# Patient Record
Sex: Female | Born: 1965 | Race: White | Hispanic: No | Marital: Married | Smoking: Never smoker
Health system: Southern US, Community
[De-identification: ages and names within clinical notes are randomized; demographics above are authoritative.]

## PROBLEM LIST (undated history)

## (undated) DIAGNOSIS — L811 Chloasma: Secondary | ICD-10-CM

## (undated) DIAGNOSIS — R0602 Shortness of breath: Secondary | ICD-10-CM

## (undated) DIAGNOSIS — F32A Depression, unspecified: Secondary | ICD-10-CM

## (undated) DIAGNOSIS — M2550 Pain in unspecified joint: Secondary | ICD-10-CM

## (undated) DIAGNOSIS — N979 Female infertility, unspecified: Secondary | ICD-10-CM

## (undated) DIAGNOSIS — I10 Essential (primary) hypertension: Secondary | ICD-10-CM

## (undated) DIAGNOSIS — Z8711 Personal history of peptic ulcer disease: Secondary | ICD-10-CM

## (undated) DIAGNOSIS — R51 Headache: Secondary | ICD-10-CM

## (undated) DIAGNOSIS — F329 Major depressive disorder, single episode, unspecified: Secondary | ICD-10-CM

## (undated) DIAGNOSIS — K5900 Constipation, unspecified: Secondary | ICD-10-CM

## (undated) DIAGNOSIS — G4730 Sleep apnea, unspecified: Secondary | ICD-10-CM

## (undated) DIAGNOSIS — D649 Anemia, unspecified: Secondary | ICD-10-CM

## (undated) DIAGNOSIS — J45909 Unspecified asthma, uncomplicated: Secondary | ICD-10-CM

## (undated) DIAGNOSIS — R011 Cardiac murmur, unspecified: Secondary | ICD-10-CM

## (undated) DIAGNOSIS — F909 Attention-deficit hyperactivity disorder, unspecified type: Secondary | ICD-10-CM

## (undated) DIAGNOSIS — E559 Vitamin D deficiency, unspecified: Secondary | ICD-10-CM

## (undated) DIAGNOSIS — R519 Headache, unspecified: Secondary | ICD-10-CM

## (undated) DIAGNOSIS — F1010 Alcohol abuse, uncomplicated: Secondary | ICD-10-CM

## (undated) DIAGNOSIS — C801 Malignant (primary) neoplasm, unspecified: Secondary | ICD-10-CM

## (undated) DIAGNOSIS — F419 Anxiety disorder, unspecified: Secondary | ICD-10-CM

## (undated) DIAGNOSIS — R002 Palpitations: Secondary | ICD-10-CM

## (undated) DIAGNOSIS — M1990 Unspecified osteoarthritis, unspecified site: Secondary | ICD-10-CM

## (undated) DIAGNOSIS — E739 Lactose intolerance, unspecified: Secondary | ICD-10-CM

## (undated) DIAGNOSIS — K279 Peptic ulcer, site unspecified, unspecified as acute or chronic, without hemorrhage or perforation: Secondary | ICD-10-CM

## (undated) DIAGNOSIS — E538 Deficiency of other specified B group vitamins: Secondary | ICD-10-CM

## (undated) DIAGNOSIS — K275 Chronic or unspecified peptic ulcer, site unspecified, with perforation: Secondary | ICD-10-CM

## (undated) DIAGNOSIS — R7303 Prediabetes: Secondary | ICD-10-CM

## (undated) DIAGNOSIS — D509 Iron deficiency anemia, unspecified: Secondary | ICD-10-CM

## (undated) DIAGNOSIS — K589 Irritable bowel syndrome without diarrhea: Secondary | ICD-10-CM

## (undated) DIAGNOSIS — K5790 Diverticulosis of intestine, part unspecified, without perforation or abscess without bleeding: Secondary | ICD-10-CM

## (undated) HISTORY — DX: Depression, unspecified: F32.A

## (undated) HISTORY — DX: Peptic ulcer, site unspecified, unspecified as acute or chronic, without hemorrhage or perforation: K27.9

## (undated) HISTORY — DX: Cardiac murmur, unspecified: R01.1

## (undated) HISTORY — DX: Chloasma: L81.1

## (undated) HISTORY — DX: Constipation, unspecified: K59.00

## (undated) HISTORY — DX: Major depressive disorder, single episode, unspecified: F32.9

## (undated) HISTORY — PX: COLONOSCOPY: SHX174

## (undated) HISTORY — DX: Attention-deficit hyperactivity disorder, unspecified type: F90.9

## (undated) HISTORY — PX: TONSILLECTOMY: SUR1361

## (undated) HISTORY — PX: GASTRIC BYPASS OPEN: SUR638

## (undated) HISTORY — DX: Irritable bowel syndrome, unspecified: K58.9

## (undated) HISTORY — PX: CHOLECYSTECTOMY: SHX55

## (undated) HISTORY — DX: Malignant (primary) neoplasm, unspecified: C80.1

## (undated) HISTORY — DX: Palpitations: R00.2

## (undated) HISTORY — DX: Deficiency of other specified B group vitamins: E53.8

## (undated) HISTORY — DX: Anxiety disorder, unspecified: F41.9

## (undated) HISTORY — DX: Shortness of breath: R06.02

## (undated) HISTORY — DX: Unspecified asthma, uncomplicated: J45.909

## (undated) HISTORY — DX: Iron deficiency anemia, unspecified: D50.9

## (undated) HISTORY — DX: Anemia, unspecified: D64.9

## (undated) HISTORY — DX: Lactose intolerance, unspecified: E73.9

## (undated) HISTORY — DX: Female infertility, unspecified: N97.9

## (undated) HISTORY — PX: OTHER SURGICAL HISTORY: SHX169

## (undated) HISTORY — PX: UPPER GI ENDOSCOPY: SHX6162

## (undated) HISTORY — DX: Personal history of peptic ulcer disease: Z87.11

## (undated) HISTORY — DX: Diverticulosis of intestine, part unspecified, without perforation or abscess without bleeding: K57.90

## (undated) HISTORY — DX: Alcohol abuse, uncomplicated: F10.10

## (undated) HISTORY — PX: ENDOMETRIAL ABLATION W/ NOVASURE: SUR434

## (undated) HISTORY — PX: ABDOMINAL HYSTERECTOMY: SHX81

## (undated) HISTORY — DX: Pain in unspecified joint: M25.50

## (undated) HISTORY — DX: Prediabetes: R73.03

## (undated) HISTORY — DX: Vitamin D deficiency, unspecified: E55.9

---

## 1999-09-30 ENCOUNTER — Emergency Department (HOSPITAL_COMMUNITY): Admission: EM | Admit: 1999-09-30 | Discharge: 1999-09-30 | Payer: Self-pay | Admitting: *Deleted

## 2002-04-08 ENCOUNTER — Emergency Department (HOSPITAL_COMMUNITY): Admission: EM | Admit: 2002-04-08 | Discharge: 2002-04-08 | Payer: Self-pay | Admitting: *Deleted

## 2002-07-09 ENCOUNTER — Emergency Department (HOSPITAL_COMMUNITY): Admission: EM | Admit: 2002-07-09 | Discharge: 2002-07-09 | Payer: Self-pay | Admitting: Emergency Medicine

## 2004-12-23 ENCOUNTER — Emergency Department (HOSPITAL_COMMUNITY): Admission: EM | Admit: 2004-12-23 | Discharge: 2004-12-23 | Payer: Self-pay | Admitting: Emergency Medicine

## 2005-09-23 ENCOUNTER — Emergency Department (HOSPITAL_COMMUNITY): Admission: EM | Admit: 2005-09-23 | Discharge: 2005-09-23 | Payer: Self-pay | Admitting: Family Medicine

## 2007-12-21 ENCOUNTER — Other Ambulatory Visit: Payer: Self-pay

## 2007-12-21 ENCOUNTER — Emergency Department: Payer: Self-pay | Admitting: Emergency Medicine

## 2008-03-07 ENCOUNTER — Encounter: Admission: RE | Admit: 2008-03-07 | Discharge: 2008-03-07 | Payer: Self-pay | Admitting: Obstetrics and Gynecology

## 2009-05-01 ENCOUNTER — Ambulatory Visit (HOSPITAL_COMMUNITY): Admission: RE | Admit: 2009-05-01 | Discharge: 2009-05-01 | Payer: Self-pay | Admitting: Radiology

## 2010-01-31 ENCOUNTER — Ambulatory Visit (HOSPITAL_COMMUNITY): Admission: RE | Admit: 2010-01-31 | Discharge: 2010-01-31 | Payer: Self-pay | Admitting: Obstetrics and Gynecology

## 2010-03-16 DIAGNOSIS — K760 Fatty (change of) liver, not elsewhere classified: Secondary | ICD-10-CM

## 2010-03-16 HISTORY — DX: Fatty (change of) liver, not elsewhere classified: K76.0

## 2010-04-06 ENCOUNTER — Encounter: Payer: Self-pay | Admitting: Obstetrics and Gynecology

## 2010-05-27 LAB — CBC
HCT: 39.8 % (ref 36.0–46.0)
Hemoglobin: 13.3 g/dL (ref 12.0–15.0)
MCH: 30.1 pg (ref 26.0–34.0)
MCHC: 33.5 g/dL (ref 30.0–36.0)
MCV: 90.1 fL (ref 78.0–100.0)
Platelets: 287 10*3/uL (ref 150–400)
RBC: 4.42 MIL/uL (ref 3.87–5.11)
RDW: 12.5 % (ref 11.5–15.5)
WBC: 7.7 10*3/uL (ref 4.0–10.5)

## 2010-05-27 LAB — BASIC METABOLIC PANEL
BUN: 6 mg/dL (ref 6–23)
CO2: 34 mEq/L — ABNORMAL HIGH (ref 19–32)
Calcium: 8.9 mg/dL (ref 8.4–10.5)
Chloride: 98 mEq/L (ref 96–112)
Creatinine, Ser: 0.58 mg/dL (ref 0.4–1.2)
GFR calc Af Amer: >60 mL/min (ref >60)
GFR calc non Af Amer: >60 mL/min (ref >60)
Glucose, Bld: 90 mg/dL (ref 70–99)
Potassium: 3.5 mEq/L (ref 3.5–5.1)
Sodium: 139 mEq/L (ref 135–145)

## 2010-05-27 LAB — PREGNANCY, URINE: Preg Test, Ur: NEGATIVE

## 2010-06-05 LAB — BASIC METABOLIC PANEL
BUN: 9 mg/dL (ref 6–23)
CO2: 27 mEq/L (ref 19–32)
Calcium: 8.8 mg/dL (ref 8.4–10.5)
Chloride: 99 mEq/L (ref 96–112)
Creatinine, Ser: 0.61 mg/dL (ref 0.4–1.2)
GFR calc Af Amer: >60 mL/min (ref >60)
GFR calc non Af Amer: >60 mL/min (ref >60)
Glucose, Bld: 89 mg/dL (ref 70–99)
Potassium: 3.2 mEq/L — ABNORMAL LOW (ref 3.5–5.1)
Sodium: 134 mEq/L — ABNORMAL LOW (ref 135–145)

## 2010-06-05 LAB — CBC
HCT: 29.9 % — ABNORMAL LOW (ref 36.0–46.0)
Hemoglobin: 9.4 g/dL — ABNORMAL LOW (ref 12.0–15.0)
MCHC: 31.5 g/dL (ref 30.0–36.0)
MCV: 69.8 fL — ABNORMAL LOW (ref 78.0–100.0)
Platelets: 376 10*3/uL (ref 150–400)
RBC: 4.28 MIL/uL (ref 3.87–5.11)
RDW: 15.8 % — ABNORMAL HIGH (ref 11.5–15.5)
WBC: 8.6 10*3/uL (ref 4.0–10.5)

## 2010-06-05 LAB — PREGNANCY, URINE: Preg Test, Ur: NEGATIVE

## 2011-07-14 ENCOUNTER — Other Ambulatory Visit: Payer: Self-pay | Admitting: Obstetrics and Gynecology

## 2012-08-03 ENCOUNTER — Ambulatory Visit: Payer: Self-pay | Admitting: Emergency Medicine

## 2012-08-03 LAB — RAPID STREP-A WITH REFLX: Micro Text Report: NEGATIVE

## 2012-08-05 LAB — BETA STREP CULTURE(ARMC)

## 2012-08-07 ENCOUNTER — Ambulatory Visit: Payer: Self-pay | Admitting: Emergency Medicine

## 2012-11-30 ENCOUNTER — Ambulatory Visit: Payer: Self-pay | Admitting: Family Medicine

## 2013-01-03 ENCOUNTER — Other Ambulatory Visit: Payer: Self-pay | Admitting: Obstetrics and Gynecology

## 2013-01-09 DIAGNOSIS — D649 Anemia, unspecified: Secondary | ICD-10-CM | POA: Insufficient documentation

## 2013-05-22 ENCOUNTER — Emergency Department: Payer: Self-pay | Admitting: Emergency Medicine

## 2013-05-22 LAB — URINALYSIS, COMPLETE
Bilirubin,UR: NEGATIVE
Blood: NEGATIVE
Glucose,UR: NEGATIVE mg/dL (ref 0–75)
Ketone: NEGATIVE
Leukocyte Esterase: NEGATIVE
Nitrite: NEGATIVE
Ph: 6 (ref 4.5–8.0)
Protein: NEGATIVE
RBC,UR: 1 /HPF (ref 0–5)
Specific Gravity: 1.019 (ref 1.003–1.030)
Squamous Epithelial: 4
WBC UR: 1 /HPF (ref 0–5)

## 2013-05-22 LAB — BASIC METABOLIC PANEL
Anion Gap: 3 — ABNORMAL LOW (ref 7–16)
BUN: 12 mg/dL (ref 7–18)
Calcium, Total: 9 mg/dL (ref 8.5–10.1)
Chloride: 102 mmol/L (ref 98–107)
Co2: 33 mmol/L — ABNORMAL HIGH (ref 21–32)
Creatinine: 0.51 mg/dL — ABNORMAL LOW (ref 0.60–1.30)
EGFR (African American): >60
EGFR (Non-African Amer.): >60
Glucose: 100 mg/dL — ABNORMAL HIGH (ref 65–99)
Osmolality: 276 (ref 275–301)
Potassium: 3.1 mmol/L — ABNORMAL LOW (ref 3.5–5.1)
Sodium: 138 mmol/L (ref 136–145)

## 2013-05-22 LAB — CBC WITH DIFFERENTIAL/PLATELET
Basophil #: 0.1 10*3/uL (ref 0.0–0.1)
Basophil %: 1.1 %
Eosinophil #: 0.3 10*3/uL (ref 0.0–0.7)
Eosinophil %: 4.1 %
HCT: 33.1 % — ABNORMAL LOW (ref 35.0–47.0)
HGB: 10.6 g/dL — ABNORMAL LOW (ref 12.0–16.0)
Lymphocyte #: 3.2 10*3/uL (ref 1.0–3.6)
Lymphocyte %: 38.8 %
MCH: 23.2 pg — ABNORMAL LOW (ref 26.0–34.0)
MCHC: 32 g/dL (ref 32.0–36.0)
MCV: 73 fL — ABNORMAL LOW (ref 80–100)
Monocyte #: 0.6 x10 3/mm (ref 0.2–0.9)
Monocyte %: 7.7 %
Neutrophil #: 4 10*3/uL (ref 1.4–6.5)
Neutrophil %: 48.3 %
Platelet: 351 10*3/uL (ref 150–440)
RBC: 4.56 10*6/uL (ref 3.80–5.20)
RDW: 19.4 % — ABNORMAL HIGH (ref 11.5–14.5)
WBC: 8.2 10*3/uL (ref 3.6–11.0)

## 2013-05-22 LAB — TROPONIN I: Troponin-I: <0.02 ng/mL

## 2013-09-12 DIAGNOSIS — L811 Chloasma: Secondary | ICD-10-CM | POA: Insufficient documentation

## 2013-09-12 DIAGNOSIS — R011 Cardiac murmur, unspecified: Secondary | ICD-10-CM | POA: Insufficient documentation

## 2013-09-26 ENCOUNTER — Ambulatory Visit: Payer: Self-pay | Admitting: Family Medicine

## 2013-09-26 LAB — COMPREHENSIVE METABOLIC PANEL
Albumin: 3.5 g/dL (ref 3.4–5.0)
Alkaline Phosphatase: 72 U/L
Anion Gap: 8 (ref 7–16)
BUN: 10 mg/dL (ref 7–18)
Bilirubin,Total: <0.1 mg/dL — ABNORMAL LOW (ref 0.2–1.0)
Calcium, Total: 8.9 mg/dL (ref 8.5–10.1)
Chloride: 102 mmol/L (ref 98–107)
Co2: 31 mmol/L (ref 21–32)
Creatinine: 0.71 mg/dL (ref 0.60–1.30)
EGFR (African American): >60
EGFR (Non-African Amer.): >60
Glucose: 85 mg/dL (ref 65–99)
Osmolality: 280 (ref 275–301)
Potassium: 3.2 mmol/L — ABNORMAL LOW (ref 3.5–5.1)
SGOT(AST): 17 U/L (ref 15–37)
SGPT (ALT): 27 U/L (ref 12–78)
Sodium: 141 mmol/L (ref 136–145)
Total Protein: 7.5 g/dL (ref 6.4–8.2)

## 2013-09-26 LAB — CBC WITH DIFFERENTIAL/PLATELET
Basophil #: 0.1 10*3/uL (ref 0.0–0.1)
Basophil %: 1 %
Eosinophil #: 0.3 10*3/uL (ref 0.0–0.7)
Eosinophil %: 3.7 %
HCT: 33.1 % — ABNORMAL LOW (ref 35.0–47.0)
HGB: 10.3 g/dL — ABNORMAL LOW (ref 12.0–16.0)
Lymphocyte #: 2.8 10*3/uL (ref 1.0–3.6)
Lymphocyte %: 40.3 %
MCH: 22.3 pg — ABNORMAL LOW (ref 26.0–34.0)
MCHC: 31.2 g/dL — ABNORMAL LOW (ref 32.0–36.0)
MCV: 72 fL — ABNORMAL LOW (ref 80–100)
Monocyte #: 0.4 x10 3/mm (ref 0.2–0.9)
Monocyte %: 6.4 %
Neutrophil #: 3.4 10*3/uL (ref 1.4–6.5)
Neutrophil %: 48.6 %
Platelet: 407 10*3/uL (ref 150–440)
RBC: 4.62 10*6/uL (ref 3.80–5.20)
RDW: 17.5 % — ABNORMAL HIGH (ref 11.5–14.5)
WBC: 6.9 10*3/uL (ref 3.6–11.0)

## 2013-09-26 LAB — LIPASE, BLOOD: Lipase: 111 U/L (ref 73–393)

## 2013-10-02 DIAGNOSIS — K5790 Diverticulosis of intestine, part unspecified, without perforation or abscess without bleeding: Secondary | ICD-10-CM | POA: Insufficient documentation

## 2013-10-31 ENCOUNTER — Ambulatory Visit: Payer: Self-pay | Admitting: Internal Medicine

## 2013-10-31 LAB — CANCER CENTER HEMOGLOBIN: HGB: 10 g/dL — ABNORMAL LOW (ref 12.0–16.0)

## 2013-11-14 ENCOUNTER — Ambulatory Visit: Payer: Self-pay | Admitting: Internal Medicine

## 2013-11-21 LAB — CANCER CENTER HEMOGLOBIN: HGB: 10.3 g/dL — ABNORMAL LOW (ref 12.0–16.0)

## 2013-12-01 DIAGNOSIS — Z8619 Personal history of other infectious and parasitic diseases: Secondary | ICD-10-CM | POA: Insufficient documentation

## 2013-12-05 LAB — PLATELET COUNT: Platelet: 338 10*3/uL (ref 150–440)

## 2013-12-05 LAB — FERRITIN: Ferritin (ARMC): 23 ng/mL (ref 8–388)

## 2013-12-05 LAB — CANCER CENTER HEMOGLOBIN: HGB: 10.9 g/dL — ABNORMAL LOW (ref 12.0–16.0)

## 2013-12-14 ENCOUNTER — Ambulatory Visit: Payer: Self-pay | Admitting: Internal Medicine

## 2014-02-23 ENCOUNTER — Other Ambulatory Visit: Payer: Self-pay | Admitting: Obstetrics and Gynecology

## 2014-02-26 LAB — CYTOLOGY - PAP

## 2014-04-20 ENCOUNTER — Other Ambulatory Visit: Payer: Self-pay | Admitting: Obstetrics and Gynecology

## 2014-05-31 ENCOUNTER — Ambulatory Visit: Payer: Self-pay | Admitting: Physician Assistant

## 2015-01-29 ENCOUNTER — Inpatient Hospital Stay: Payer: BLUE CROSS/BLUE SHIELD | Attending: Internal Medicine | Admitting: Internal Medicine

## 2015-01-29 ENCOUNTER — Encounter: Payer: Self-pay | Admitting: Internal Medicine

## 2015-01-29 ENCOUNTER — Inpatient Hospital Stay: Payer: BLUE CROSS/BLUE SHIELD

## 2015-01-29 VITALS — BP 139/75 | HR 74 | Temp 97.5°F | Resp 18 | Ht 65.0 in | Wt 308.6 lb

## 2015-01-29 DIAGNOSIS — D508 Other iron deficiency anemias: Secondary | ICD-10-CM | POA: Insufficient documentation

## 2015-01-29 DIAGNOSIS — L659 Nonscarring hair loss, unspecified: Secondary | ICD-10-CM | POA: Diagnosis not present

## 2015-01-29 DIAGNOSIS — R5383 Other fatigue: Secondary | ICD-10-CM | POA: Diagnosis not present

## 2015-01-29 DIAGNOSIS — F419 Anxiety disorder, unspecified: Secondary | ICD-10-CM | POA: Diagnosis not present

## 2015-01-29 DIAGNOSIS — Z800 Family history of malignant neoplasm of digestive organs: Secondary | ICD-10-CM | POA: Insufficient documentation

## 2015-01-29 DIAGNOSIS — Z9884 Bariatric surgery status: Secondary | ICD-10-CM | POA: Insufficient documentation

## 2015-01-29 DIAGNOSIS — K279 Peptic ulcer, site unspecified, unspecified as acute or chronic, without hemorrhage or perforation: Secondary | ICD-10-CM | POA: Insufficient documentation

## 2015-01-29 DIAGNOSIS — R011 Cardiac murmur, unspecified: Secondary | ICD-10-CM | POA: Insufficient documentation

## 2015-01-29 DIAGNOSIS — K5790 Diverticulosis of intestine, part unspecified, without perforation or abscess without bleeding: Secondary | ICD-10-CM | POA: Insufficient documentation

## 2015-01-29 DIAGNOSIS — Z8711 Personal history of peptic ulcer disease: Secondary | ICD-10-CM | POA: Diagnosis not present

## 2015-01-29 DIAGNOSIS — F329 Major depressive disorder, single episode, unspecified: Secondary | ICD-10-CM | POA: Insufficient documentation

## 2015-01-29 DIAGNOSIS — L811 Chloasma: Secondary | ICD-10-CM | POA: Insufficient documentation

## 2015-01-29 DIAGNOSIS — D509 Iron deficiency anemia, unspecified: Secondary | ICD-10-CM

## 2015-01-29 DIAGNOSIS — Z79899 Other long term (current) drug therapy: Secondary | ICD-10-CM | POA: Insufficient documentation

## 2015-01-29 DIAGNOSIS — J45909 Unspecified asthma, uncomplicated: Secondary | ICD-10-CM | POA: Insufficient documentation

## 2015-01-29 DIAGNOSIS — J029 Acute pharyngitis, unspecified: Secondary | ICD-10-CM | POA: Diagnosis not present

## 2015-01-29 LAB — CBC WITH DIFFERENTIAL/PLATELET
Basophils Absolute: 0.1 10*3/uL (ref 0–0.1)
Basophils Relative: 1 %
Eosinophils Absolute: 0.2 10*3/uL (ref 0–0.7)
Eosinophils Relative: 2 %
HCT: 34.2 % — ABNORMAL LOW (ref 35.0–47.0)
Hemoglobin: 10.9 g/dL — ABNORMAL LOW (ref 12.0–16.0)
Lymphocytes Relative: 29 %
Lymphs Abs: 2.8 10*3/uL (ref 1.0–3.6)
MCH: 23.6 pg — ABNORMAL LOW (ref 26.0–34.0)
MCHC: 31.9 g/dL — ABNORMAL LOW (ref 32.0–36.0)
MCV: 74 fL — ABNORMAL LOW (ref 80.0–100.0)
Monocytes Absolute: 0.6 10*3/uL (ref 0.2–0.9)
Monocytes Relative: 6 %
Neutro Abs: 5.9 10*3/uL (ref 1.4–6.5)
Neutrophils Relative %: 62 %
Platelets: 348 10*3/uL (ref 150–440)
RBC: 4.62 MIL/uL (ref 3.80–5.20)
RDW: 17.4 % — ABNORMAL HIGH (ref 11.5–14.5)
WBC: 9.5 10*3/uL (ref 3.6–11.0)

## 2015-01-29 LAB — FERRITIN: Ferritin: 6 ng/mL — ABNORMAL LOW (ref 11–307)

## 2015-01-29 NOTE — Progress Notes (Signed)
[facility] OFFICE PROGRESS NOTE  Patient Care Team: Hortencia Pilar, MD as PCP - General (Family Medicine)   SUMMARY OF ONCOLOGIC HISTORY:  #  IRON DEF ANEMIA- likely secondary to gastric bypass/malabsorption [2011]   INTERVAL HISTORY:  A pleasant 49 year old female patient with above history of iron deficiency anemia secondary to history of gastric bypass/malabsorption is here for follow-up. Patient last received IV iron in 2015 September.  She denies any blood in stools black stools. However does admit to increasing fatigue; hair loss; also brittle nails.   Just the last few days she noted to have sore throat and cold like symptoms. Otherwise denies any unusual weight loss or difficulty swallowing.  REVIEW OF SYSTEMS:  A complete 10 point review of system is done which is negative except mentioned above/history of present illness.   PAST MEDICAL HISTORY :  Past Medical History  Diagnosis Date  . IDA (iron deficiency anemia)   . PUD (peptic ulcer disease)   . Asthma   . Anxiety and depression   . Anemia   . Melasma   . Heart murmur   . Diverticulosis     PAST SURGICAL HISTORY :   Past Surgical History  Procedure Laterality Date  . Gastric bypass open    . Tonsillectomy    . Endometrial ablation w/ novasure      FAMILY HISTORY :   Family History  Problem Relation Age of Onset  . Pancreatic cancer Sister 25  . Breast cancer Maternal Aunt 60  . COPD Mother   . Anxiety disorder Mother   . Depression Mother   . Diabetes type II Mother   . Obesity Mother   . Crohn's disease Mother   . Coronary artery disease Father   . Diabetes type II Father   . Hypertension Father   . Obesity Sister   . Depression Maternal Grandmother   . Obesity Maternal Grandmother   . Cancer Maternal Grandmother   . Coronary artery disease Maternal Grandmother   . Coronary artery disease Paternal Grandfather   . Glaucoma Paternal Grandmother   . Hypertension Paternal  Grandmother   . Osteoarthritis Paternal Grandmother     SOCIAL HISTORY:   Social History  Substance Use Topics  . Smoking status: Not on file  . Smokeless tobacco: Not on file  . Alcohol Use: Not on file    ALLERGIES:  is allergic to nsaids.  MEDICATIONS:  Current Outpatient Prescriptions  Medication Sig Dispense Refill  . albuterol (PROVENTIL HFA) 108 (90 BASE) MCG/ACT inhaler Inhale 1 puff into the lungs every 4 (four) hours while awake. Prn shortness of breath    . FLUoxetine (PROZAC) 40 MG capsule Take 1 capsule by mouth daily.    [name] HYDROcodone-acetaminophen (NORCO/VICODIN) 5-325 MG tablet Take 1 tablet by mouth every 4 (four) hours. For knee pain    . lamoTRIgine (LAMICTAL) 200 MG tablet Take 1 tablet by mouth 2 (two) times daily.    [name] ALPRAZolam (XANAX) 1 MG tablet Take 1 tablet by mouth daily. As needed for anxiety  0  . ARIPiprazole (ABILIFY) 2 MG tablet Take 2 mg by mouth daily.  0  . Cetirizine HCl 10 MG CAPS Take 1 capsule by mouth daily as needed. allergies    . FLUoxetine (PROZAC) 20 MG tablet Take 1 tablet by mouth daily. Will start as soon as finished with 40 mg capsule of prozac  0   No current facility-administered medications for this visit.    PHYSICAL  EXAMINATION:  BP 139/75 mmHg  Pulse 74  Temp(Src) 97.5 F (36.4 C) (Tympanic)  Resp 18  Ht 5' 5" (1.651 m)  Wt 308 lb 10.3 oz (140 kg)  BMI 51.36 kg/m2  Filed Weights   01/29/15 1450  Weight: 308 lb 10.3 oz (140 kg)    GENERAL: Well-nourished well-developed; Alert, no distress and comfortable. Alone. Obese. EYES: Positive for pallor or icterus. OROPHARYNX: no thrush or ulceration; good dentition  NECK: supple, no masses felt LYMPH:  no palpable lymphadenopathy in the cervical, axillary or inguinal regions LUNGS: clear to auscultation and  No wheeze or crackles HEART/CVS: regular rate & rhythm and no murmurs; No lower extremity edema ABDOMEN:abdomen soft, non-tender and normal bowel  sounds Musculoskeletal:no cyanosis of digits and no clubbing  PSYCH: alert & oriented x 3 with fluent speech NEURO: no focal motor/sensory deficits SKIN:  no rashes or significant lesions  LABORATORY DATA:  I have reviewed the data as listed    Component Value Date/Time   NA 141 09/26/2013 1414   NA 139 01/24/2010 1230   K 3.2* 09/26/2013 1414   K 3.5 01/24/2010 1230   CL 102 09/26/2013 1414   CL 98 01/24/2010 1230   CO2 31 09/26/2013 1414   CO2 34* 01/24/2010 1230   GLUCOSE 85 09/26/2013 1414   GLUCOSE 90 01/24/2010 1230   BUN 10 09/26/2013 1414   BUN 6 01/24/2010 1230   CREATININE 0.71 09/26/2013 1414   CREATININE 0.58 01/24/2010 1230   CALCIUM 8.9 09/26/2013 1414   CALCIUM 8.9 01/24/2010 1230   PROT 7.5 09/26/2013 1414   ALBUMIN 3.5 09/26/2013 1414   AST 17 09/26/2013 1414   ALT 27 09/26/2013 1414   ALKPHOS 72 09/26/2013 1414   BILITOT < 0.1* 09/26/2013 1414   GFRNONAA >60 09/26/2013 1414   GFRNONAA >60 01/24/2010 1230   GFRAA >60 09/26/2013 1414   GFRAA  01/24/2010 1230    >60        The eGFR has been calculated using the MDRD equation. This calculation has not been validated in all clinical situations. eGFR's persistently <60 mL/min signify possible Chronic Kidney Disease.    No results found for: SPEP, UPEP  Lab Results  Component Value Date   WBC 6.9 09/26/2013   NEUTROABS 3.4 09/26/2013   HGB 10.9* 12/05/2013   HCT 33.1* 09/26/2013   MCV 72* 09/26/2013   PLT 338 12/05/2013      Chemistry      Component Value Date/Time   NA 141 09/26/2013 1414   NA 139 01/24/2010 1230   K 3.2* 09/26/2013 1414   K 3.5 01/24/2010 1230   CL 102 09/26/2013 1414   CL 98 01/24/2010 1230   CO2 31 09/26/2013 1414   CO2 34* 01/24/2010 1230   BUN 10 09/26/2013 1414   BUN 6 01/24/2010 1230   CREATININE 0.71 09/26/2013 1414   CREATININE 0.58 01/24/2010 1230      Component Value Date/Time   CALCIUM 8.9 09/26/2013 1414   CALCIUM 8.9 01/24/2010 1230   ALKPHOS 72  09/26/2013 1414   AST 17 09/26/2013 1414   ALT 27 09/26/2013 1414   BILITOT < 0.1* 09/26/2013 1414       RADIOGRAPHIC STUDIES: I have personally reviewed the radiological images as listed and agreed with the findings in the report. No results found.   ASSESSMENT & PLAN:   # Iron deficiency anemia- likely secondary to malabsorption/prior history of gastric bypass. Symptomatically patient appears to be iron deficient. Her  most recent hemoglobin from March 2016-11.2 with MCV of 75.   # Given her significant symptoms of fatigue/suggestive of iron deficiency- I would recommend checking CBC with ferritin today. And if low, I would recommend IV iron. Orders for Feraheme 2; if possible this week. Also Venofer 200 mg IV in approximately 4 months again.   # I would recommend a follow-up in approximately 4 months or sooner based upon her above blood work. She agrees.  Orders Placed This Encounter  Procedures  . CBC with Differential    Standing Status: Future     Number of Occurrences:      Standing Expiration Date: 01/29/2016  . Ferritin    Standing Status: Future     Number of Occurrences:      Standing Expiration Date: 01/29/2016   All questions were answered. The patient knows to call the clinic with any problems, questions or concerns. No barriers to learning was detected.      Cammie Sickle, MD 01/29/2015 3:12 PM

## 2015-01-30 ENCOUNTER — Telehealth: Payer: Self-pay | Admitting: Internal Medicine

## 2015-01-30 NOTE — Telephone Encounter (Signed)
Please inform patient that her labs show low iron. She would benefit from IV iron therapy. Again we'll plan to do IV iron in 4 months if labs are abnormal at that time.

## 2015-01-30 NOTE — Telephone Encounter (Signed)
Patient informed. Questions were answered to her satisfaction.

## 2015-02-04 ENCOUNTER — Inpatient Hospital Stay: Payer: BLUE CROSS/BLUE SHIELD

## 2015-02-04 VITALS — BP 149/63 | HR 63 | Temp 97.8°F | Resp 20

## 2015-02-04 DIAGNOSIS — D508 Other iron deficiency anemias: Secondary | ICD-10-CM

## 2015-02-04 DIAGNOSIS — D509 Iron deficiency anemia, unspecified: Secondary | ICD-10-CM | POA: Diagnosis not present

## 2015-02-04 MED ORDER — SODIUM CHLORIDE 0.9 % IV SOLN
Freq: Once | INTRAVENOUS | Status: AC
  Administered 2015-02-04: 09:00:00 via INTRAVENOUS
  Filled 2015-02-04: qty 1000

## 2015-02-04 MED ORDER — SODIUM CHLORIDE 0.9 % IV SOLN
510.0000 mg | Freq: Once | INTRAVENOUS | Status: AC
  Administered 2015-02-04: 510 mg via INTRAVENOUS
  Filled 2015-02-04: qty 17

## 2015-02-05 ENCOUNTER — Ambulatory Visit: Payer: BLUE CROSS/BLUE SHIELD

## 2015-04-02 ENCOUNTER — Other Ambulatory Visit: Payer: Self-pay | Admitting: Obstetrics and Gynecology

## 2015-04-03 LAB — CYTOLOGY - PAP

## 2015-05-14 ENCOUNTER — Other Ambulatory Visit: Payer: Self-pay | Admitting: Obstetrics and Gynecology

## 2015-05-27 ENCOUNTER — Other Ambulatory Visit: Payer: BLUE CROSS/BLUE SHIELD

## 2015-05-28 ENCOUNTER — Ambulatory Visit: Payer: BLUE CROSS/BLUE SHIELD

## 2015-05-28 ENCOUNTER — Ambulatory Visit: Payer: BLUE CROSS/BLUE SHIELD | Admitting: Internal Medicine

## 2015-06-11 DIAGNOSIS — E538 Deficiency of other specified B group vitamins: Secondary | ICD-10-CM | POA: Insufficient documentation

## 2015-07-03 ENCOUNTER — Other Ambulatory Visit: Payer: Self-pay | Admitting: Family Medicine

## 2015-07-03 DIAGNOSIS — R1011 Right upper quadrant pain: Secondary | ICD-10-CM

## 2015-07-04 ENCOUNTER — Ambulatory Visit: Payer: BLUE CROSS/BLUE SHIELD

## 2016-01-11 ENCOUNTER — Emergency Department
Admission: EM | Admit: 2016-01-11 | Discharge: 2016-01-11 | Disposition: A | Discharge status: Home / Self Care | Payer: BLUE CROSS/BLUE SHIELD | Attending: Emergency Medicine | Admitting: Emergency Medicine

## 2016-01-11 ENCOUNTER — Encounter: Payer: Self-pay | Admitting: Emergency Medicine

## 2016-01-11 DIAGNOSIS — Z79899 Other long term (current) drug therapy: Secondary | ICD-10-CM | POA: Insufficient documentation

## 2016-01-11 DIAGNOSIS — S51812A Laceration without foreign body of left forearm, initial encounter: Secondary | ICD-10-CM | POA: Diagnosis not present

## 2016-01-11 DIAGNOSIS — Y939 Activity, unspecified: Secondary | ICD-10-CM | POA: Insufficient documentation

## 2016-01-11 DIAGNOSIS — J45909 Unspecified asthma, uncomplicated: Secondary | ICD-10-CM | POA: Diagnosis not present

## 2016-01-11 DIAGNOSIS — Y999 Unspecified external cause status: Secondary | ICD-10-CM | POA: Diagnosis not present

## 2016-01-11 DIAGNOSIS — W260XXA Contact with knife, initial encounter: Secondary | ICD-10-CM | POA: Insufficient documentation

## 2016-01-11 DIAGNOSIS — Z23 Encounter for immunization: Secondary | ICD-10-CM | POA: Insufficient documentation

## 2016-01-11 DIAGNOSIS — Y92000 Kitchen of unspecified non-institutional (private) residence as  the place of occurrence of the external cause: Secondary | ICD-10-CM | POA: Diagnosis not present

## 2016-01-11 MED ORDER — OXYCODONE-ACETAMINOPHEN 5-325 MG PO TABS
1.0000 | ORAL_TABLET | Freq: Once | ORAL | Status: AC
Start: 2016-01-11 — End: 2016-01-11
  Administered 2016-01-11: 1 via ORAL
  Filled 2016-01-11: qty 1

## 2016-01-11 MED ORDER — ONDANSETRON 4 MG PO TBDP
4.0000 mg | ORAL_TABLET | Freq: Once | ORAL | Status: AC
  Administered 2016-01-11: 4 mg via ORAL
  Filled 2016-01-11: qty 1

## 2016-01-11 MED ORDER — OXYCODONE-ACETAMINOPHEN 5-325 MG PO TABS: 1.0000 | ORAL_TABLET | ORAL | 0 refills | Status: DC | PRN

## 2016-01-11 MED ORDER — LIDOCAINE HCL (PF) 1 % IJ SOLN
30.0000 mL | Freq: Once | INTRAMUSCULAR | Status: DC
  Filled 2016-01-11: qty 30

## 2016-01-11 MED ORDER — TETANUS-DIPHTH-ACELL PERTUSSIS 5-2.5-18.5 LF-MCG/0.5 IM SUSP
0.5000 mL | Freq: Once | INTRAMUSCULAR | Status: AC
  Administered 2016-01-11: 0.5 mL via INTRAMUSCULAR
  Filled 2016-01-11: qty 0.5

## 2016-01-11 NOTE — ED Notes (Signed)
Pt states understanding of discharge instructions. NAD noted at this time.

## 2016-01-11 NOTE — Discharge Instructions (Signed)
WOUND CARE Please return in 12 days to have your stitches/staples removed or sooner if you have concerns.  Keep area clean and dry for 24 hours. Do not remove bandage, if applied.  After 24 hours, remove bandage and wash wound gently with mild soap and warm water. Reapply a new bandage after cleaning wound, if directed.  Continue daily cleansing with soap and water until stitches/staples are removed.  Do not apply any ointments or creams to the wound while stitches/staples are in place, as this may cause delayed healing.  Notify the office if you experience any of the following signs of infection: Swelling, redness, pus drainage, streaking, fever >101.0 F  Notify the office if you experience excessive bleeding that does not stop after 15-20 minutes of constant, firm pressure.  Percocet as needed for pain.

## 2016-01-11 NOTE — ED Triage Notes (Signed)
Pt to ed with c/o laceration to left forearm.  Laceration approx 4 inches long, bleeding controlled.  Pt states she tripped and sliced her arm with a knife.

## 2016-01-11 NOTE — ED Provider Notes (Signed)
[facility] Emergency Department Provider Note   ____________________________________________   First MD Initiated Contact with Patient 01/11/16 1613     (approximate)  I have reviewed the triage vital signs and the nursing notes.   HISTORY  Chief Complaint Laceration   HPI Tracy [name] is a 50 y.o. female history with complaint of laceration to her left forearm. Patient states that she was standing in the kitchen with a knife in her hand when she tripped over a child's toy causing her to cut her arm. Patient is also uncertain of her last tetanus immunization but most likely has been 15 years ago.Currently patient denies any pain but states that she is nauseous from pain that she encountered while she was at home.   Past Medical History:  Diagnosis Date  . Anemia   . Anxiety and depression   . Asthma   . Diverticulosis   . Heart murmur   . IDA (iron deficiency anemia)   . Melasma   . PUD (peptic ulcer disease)     Patient Active Problem List   Diagnosis Date Noted  . Anxiety and depression 01/29/2015  . Airway hyperreactivity 01/29/2015  . Gastroduodenal ulcer 01/29/2015  . Other iron deficiency anemia 01/29/2015  . H/O infectious disease 12/01/2013  . DD (diverticular disease) 10/02/2013  . Cardiac murmur 09/12/2013  . Chloasma 09/12/2013  . Absolute anemia 01/09/2013    Past Surgical History:  Procedure Laterality Date  . ENDOMETRIAL ABLATION W/ NOVASURE    . GASTRIC BYPASS OPEN    . TONSILLECTOMY      Prior to Admission medications   Medication Sig Start Date End Date Taking? Authorizing Provider  albuterol (PROVENTIL HFA) 108 (90 BASE) MCG/ACT inhaler Inhale 1 puff into the lungs every 4 (four) hours while awake. Prn shortness of breath 02/16/14 02/17/15  Historical Provider, MD  ALPRAZolam Duanne Moron) 1 MG tablet Take 1 tablet by mouth daily. As needed for anxiety 01/24/15   Historical Provider, MD  ARIPiprazole (ABILIFY) 2 MG  tablet Take 2 mg by mouth daily. 11/23/14   Historical Provider, MD  Cetirizine HCl 10 MG CAPS Take 1 capsule by mouth daily as needed. allergies    Historical Provider, MD  FLUoxetine (PROZAC) 20 MG tablet Take 1 tablet by mouth daily. Will start as soon as finished with 40 mg capsule of prozac 01/28/15   Historical Provider, MD  FLUoxetine (PROZAC) 40 MG capsule Take 1 capsule by mouth daily. 12/14/14   Historical Provider, MD  HYDROcodone-acetaminophen (NORCO/VICODIN) 5-325 MG tablet Take 1 tablet by mouth every 4 (four) hours. For knee pain 01/04/15   Historical Provider, MD  lamoTRIgine (LAMICTAL) 200 MG tablet Take 1 tablet by mouth 2 (two) times daily. 12/14/14   Historical Provider, MD  oxyCODONE-acetaminophen (PERCOCET) 5-325 MG tablet Take 1 tablet by mouth every 4 (four) hours as needed for severe pain. 01/11/16   Johnn Hai, PA-C    Allergies Nsaids  Family History  Problem Relation Age of Onset  . Pancreatic cancer Sister 79    died  . Breast cancer Maternal Aunt 60  . COPD Mother   . Anxiety disorder Mother   . Depression Mother   . Diabetes type II Mother   . Obesity Mother   . Crohn's disease Mother   . Coronary artery disease Father   . Diabetes type II Father   . Hypertension Father   . Obesity Sister   . Depression Maternal Grandmother   . Obesity  Maternal Grandmother   . Cancer Maternal Grandmother   . Coronary artery disease Maternal Grandmother   . Coronary artery disease Paternal Grandfather   . Leukemia Paternal Grandfather   . Glaucoma Paternal Grandmother   . Hypertension Paternal Grandmother   . Osteoarthritis Paternal Grandmother   . Skin cancer Paternal Grandmother     Social History Social History  Substance Use Topics  . Smoking status: Never Smoker  . Smokeless tobacco: Never Used  . Alcohol use No    Review of Systems Constitutional: No fever/chills Cardiovascular: Denies chest pain. Respiratory: Denies shortness of  breath. Gastrointestinal: No abdominal pain. Positive nausea, no vomiting.   Musculoskeletal: Left arm pain. Skin: Positive laceration left arm. Neurological: Negative for headaches, focal weakness or numbness.  10-point ROS otherwise negative.  ____________________________________________   PHYSICAL EXAM:  VITAL SIGNS: ED Triage Vitals  Enc Vitals Group     BP 01/11/16 1500 (!) 154/63     Pulse Rate 01/11/16 1500 85     Resp 01/11/16 1500 18     Temp 01/11/16 1500 98.5 F (36.9 C)     Temp Source 01/11/16 1500 Oral     SpO2 01/11/16 1500 96 %     Weight 01/11/16 1417 275 lb (124.7 kg)     Height 01/11/16 1417 5\' 6"  (1.676 m)     Head Circumference --      Peak Flow --      Pain Score 01/11/16 1417 0     Pain Loc --      Pain Edu? --      Excl. in [place]? --     Constitutional: Alert and oriented. Well appearing and in no acute distress. Eyes: Conjunctivae are normal. PERRL. EOMI. Head: Atraumatic. Nose: No congestion/rhinnorhea. Neck: No stridor.   Cardiovascular: Normal rate, regular rhythm. Grossly normal heart sounds.  Good peripheral circulation. Respiratory: Normal respiratory effort.  No retractions. Lungs CTAB. Musculoskeletal: Moves upper and lower extremities without any difficulty. Motor sensory function intact digits distal to her injury. Patient is able to flex and extend her wrist and fingers without any difficulty. Neurologic:  Normal speech and language. No gross focal neurologic deficits are appreciated. No gait instability. Skin:  Skin is warm, dry.  Laceration  diagonally left mid forearm across  7 cm. No foreign body was noted. There is no active bleeding at present. Moderate amount of subcutaneous tissue is exposed. Psychiatric: Mood and affect are normal. Speech and behavior are normal.  ____________________________________________   LABS (all labs ordered are listed, but only abnormal results are displayed)  Labs Reviewed - No data to  display  PROCEDURES  Procedure(s) performed: LACERATION REPAIR Performed by: Johnn Hai Authorized by: Johnn Hai Consent: Verbal consent obtained. Risks and benefits: risks, benefits and alternatives were discussed Consent given by: patient Patient identity confirmed: provided demographic data Prepped and Draped in normal sterile fashion Wound explored  Laceration Location: Left forearm volar surface  Laceration Length: 7 cm  No Foreign Bodies seen or palpated  Anesthesia: local infiltration  Local anesthetic: lidocaine 1 % without epinephrine  Anesthetic total: 10 ml  Irrigation method: syringe Amount of cleaning: standard  Skin closure: 40 and 5-0 Ethilon   Number of sutures: 16   Technique: Mattress and single interrupted   Patient tolerance: Patient tolerated the procedure well with no immediate complications.  Procedures  Critical Care performed: No  ____________________________________________   INITIAL IMPRESSION / ASSESSMENT AND PLAN / ED COURSE  Pertinent labs &  imaging results that were available during my care of the patient were reviewed by me and considered in my medical decision making (see chart for details).    Clinical Course  Patient tolerated procedure well. Patient was given a prescription for Percocet as needed for severe pain. Patient was instructed to keep clean and dry and watch for signs of infection. She will follow-up with her primary for suture removal in 12 days.   ____________________________________________   FINAL CLINICAL IMPRESSION(S) / ED DIAGNOSES  Final diagnoses:  Laceration of left forearm, initial encounter      NEW MEDICATIONS STARTED DURING THIS VISIT:  New Prescriptions   OXYCODONE-ACETAMINOPHEN (PERCOCET) 5-325 MG TABLET    Take 1 tablet by mouth every 4 (four) hours as needed for severe pain.     Note:  This document was prepared using Dragon voice recognition software and may include  unintentional dictation errors.    Johnn Hai, PA-C 01/11/16 1836    Carrie Mew, MD 01/11/16 2032

## 2016-02-13 ENCOUNTER — Encounter: Payer: Self-pay | Admitting: Emergency Medicine

## 2016-02-13 ENCOUNTER — Emergency Department: Payer: BLUE CROSS/BLUE SHIELD

## 2016-02-13 ENCOUNTER — Emergency Department
Admission: EM | Admit: 2016-02-13 | Discharge: 2016-02-13 | Disposition: A | Discharge status: Home / Self Care | Payer: BLUE CROSS/BLUE SHIELD | Attending: Emergency Medicine | Admitting: Emergency Medicine

## 2016-02-13 DIAGNOSIS — Y999 Unspecified external cause status: Secondary | ICD-10-CM | POA: Insufficient documentation

## 2016-02-13 DIAGNOSIS — S99911A Unspecified injury of right ankle, initial encounter: Secondary | ICD-10-CM | POA: Diagnosis present

## 2016-02-13 DIAGNOSIS — Y929 Unspecified place or not applicable: Secondary | ICD-10-CM | POA: Diagnosis not present

## 2016-02-13 DIAGNOSIS — S93401A Sprain of unspecified ligament of right ankle, initial encounter: Secondary | ICD-10-CM | POA: Diagnosis not present

## 2016-02-13 DIAGNOSIS — W108XXA Fall (on) (from) other stairs and steps, initial encounter: Secondary | ICD-10-CM | POA: Insufficient documentation

## 2016-02-13 DIAGNOSIS — Z79899 Other long term (current) drug therapy: Secondary | ICD-10-CM | POA: Diagnosis not present

## 2016-02-13 DIAGNOSIS — Y9389 Activity, other specified: Secondary | ICD-10-CM | POA: Diagnosis not present

## 2016-02-13 DIAGNOSIS — J45909 Unspecified asthma, uncomplicated: Secondary | ICD-10-CM | POA: Diagnosis not present

## 2016-02-13 MED ORDER — ONDANSETRON HCL 4 MG/2ML IJ SOLN
4.0000 mg | INTRAMUSCULAR | Status: AC
  Administered 2016-02-13: 4 mg via INTRAVENOUS
  Filled 2016-02-13: qty 2

## 2016-02-13 MED ORDER — MORPHINE SULFATE (PF) 4 MG/ML IV SOLN
4.0000 mg | Freq: Once | INTRAVENOUS | Status: AC
  Administered 2016-02-13: 4 mg via INTRAVENOUS
  Filled 2016-02-13: qty 1

## 2016-02-13 MED ORDER — HYDROCODONE-ACETAMINOPHEN 5-325 MG PO TABS: 1.0000 | ORAL_TABLET | ORAL | 0 refills | Status: DC | PRN

## 2016-02-13 MED ORDER — DOCUSATE SODIUM 100 MG PO CAPS: ORAL_CAPSULE | ORAL | 0 refills | Status: DC

## 2016-02-13 NOTE — ED Triage Notes (Signed)
Patient arrived from home via [facility]. Patient was in attic stepped on top step with a box and lost footing. [place] [address] backward out of attic landed on right ankle then buttocks. Denies hitting head or LOC. Patient complains of right ankle pain with bruising and swelling to ankle and top of foot. Pedal pulse in tact, cap refill less than 3 sec.

## 2016-02-13 NOTE — ED Notes (Signed)
ED Provider at bedside. 

## 2016-02-13 NOTE — Discharge Instructions (Signed)
As we discussed, you do not have any broken or dislocated bones in your foot or ankle, but you do have an ankle sprain.  Please read through the included information about routine injury care (RICE = rest, ice, compression, elevation), and take over-the-counter pain medicine according to label instructions.  If you do not have any reason to avoid ibuprofen, you can also consider taking ibuprofen 600 mg 3 times a day with meals, but do this for no more than 5 days as it may cause to some stomach discomfort over time.  Use crutches if provided and you may bear weight as tolerated.  Follow-up is recommended with the orthopedic surgeon or with your regular doctor.  Take Norco as prescribed for severe pain. Do not drink alcohol, drive or participate in any other potentially dangerous activities while taking this medication as it may make you sleepy. Do not take this medication with any other sedating medications, either prescription or over-the-counter. If you were prescribed Percocet or Vicodin, do not take these with acetaminophen (Tylenol) as it is already contained within these medications.   This medication is an opiate (or narcotic) pain medication and can be habit forming.  Use it as little as possible to achieve adequate pain control.  Do not use or use it with extreme caution if you have a history of opiate abuse or dependence.  If you are on a pain contract with your primary care doctor or a pain specialist, be sure to let them know you were prescribed this medication today from the [facility] Emergency Department.  This medication is intended for your use only - do not give any to anyone else and keep it in a secure place where nobody else, especially children, have access to it.  It will also cause or worsen constipation, so you may want to consider taking an over-the-counter stool softener while you are taking this medication.

## 2016-02-13 NOTE — ED Provider Notes (Signed)
[facility] Emergency Department Provider Note  ____________________________________________   First MD Initiated Contact with Patient 02/13/16 1524     (approximate)  I have reviewed the triage vital signs and the nursing notes.   HISTORY  Chief Complaint Foot Injury    HPI [name] is a 50 y.o. female With history of depression, anxiety, obesity, but no other significant chronic medical conditions who presents by EMS for evaluation of acute onset right foot and ankle pain after falling down some steps in her attic.  She was near the top of her flight of pulled down stairs that leads into the attic when her foot slipped and she fell.  she feels that she rolled her right ankle and there was immediate acute sharp pain as well as swelling and bruising.  The swelling has increased since the injury which occurred just prior to arrival.  Movement makes the pain worse and she cannot bear weight.  She contused the outer part of her leg including both the lower leg and part of her thigh, but the only significant pain at this point is in her right foot and ankle.  She did not strike her head, did not lose consciousness, and currently denies headache and neck pain. she has had no chest pain, shortness of breath, abdominal pain, nausea, vomiting.  Past Medical History:  Diagnosis Date  . Anemia   . Anxiety and depression   . Asthma   . Diverticulosis   . Heart murmur   . IDA (iron deficiency anemia)   . Melasma   . PUD (peptic ulcer disease)     Patient Active Problem List   Diagnosis Date Noted  . Anxiety and depression 01/29/2015  . Airway hyperreactivity 01/29/2015  . Gastroduodenal ulcer 01/29/2015  . Other iron deficiency anemia 01/29/2015  . H/O infectious disease 12/01/2013  . DD (diverticular disease) 10/02/2013  . Cardiac murmur 09/12/2013  . Chloasma 09/12/2013  . Absolute anemia 01/09/2013    Past Surgical History:  Procedure Laterality  Date  . ENDOMETRIAL ABLATION W/ NOVASURE    . GASTRIC BYPASS OPEN    . TONSILLECTOMY      Prior to Admission medications   Medication Sig Start Date End Date Taking? Authorizing Provider  albuterol (PROVENTIL HFA) 108 (90 BASE) MCG/ACT inhaler Inhale 1 puff into the lungs every 4 (four) hours while awake. Prn shortness of breath 02/16/14 02/17/15  Historical Provider, MD  ALPRAZolam Duanne Moron) 1 MG tablet Take 1 tablet by mouth daily. As needed for anxiety 01/24/15   Historical Provider, MD  ARIPiprazole (ABILIFY) 2 MG tablet Take 2 mg by mouth daily. 11/23/14   Historical Provider, MD  Cetirizine HCl 10 MG CAPS Take 1 capsule by mouth daily as needed. allergies    Historical Provider, MD  docusate sodium (COLACE) 100 MG capsule Take 1 tablet once or twice daily as needed for constipation while taking narcotic pain medicine 02/13/16   Hinda Kehr, MD  FLUoxetine (PROZAC) 20 MG tablet Take 1 tablet by mouth daily. Will start as soon as finished with 40 mg capsule of prozac 01/28/15   Historical Provider, MD  FLUoxetine (PROZAC) 40 MG capsule Take 1 capsule by mouth daily. 12/14/14   Historical Provider, MD  HYDROcodone-acetaminophen (NORCO/VICODIN) 5-325 MG tablet Take 1-2 tablets by mouth every 4 (four) hours as needed for moderate pain. 02/13/16   Hinda Kehr, MD  lamoTRIgine (LAMICTAL) 200 MG tablet Take 1 tablet by mouth 2 (two) times daily. 12/14/14  Historical Provider, MD    Allergies Nsaids  Family History  Problem Relation Age of Onset  . Pancreatic cancer Sister 74    died  . Breast cancer Maternal Aunt 60  . COPD Mother   . Anxiety disorder Mother   . Depression Mother   . Diabetes type II Mother   . Obesity Mother   . Crohn's disease Mother   . Coronary artery disease Father   . Diabetes type II Father   . Hypertension Father   . Obesity Sister   . Depression Maternal Grandmother   . Obesity Maternal Grandmother   . Cancer Maternal Grandmother   . Coronary artery disease  Maternal Grandmother   . Coronary artery disease Paternal Grandfather   . Leukemia Paternal Grandfather   . Glaucoma Paternal Grandmother   . Hypertension Paternal Grandmother   . Osteoarthritis Paternal Grandmother   . Skin cancer Paternal Grandmother     Social History Social History  Substance Use Topics  . Smoking status: Never Smoker  . Smokeless tobacco: Never Used  . Alcohol use No    Review of Systems Constitutional: No fever/chills Eyes: No visual changes. ENT: No sore throat. Cardiovascular: Denies chest pain. Respiratory: Denies shortness of breath. Gastrointestinal: No abdominal pain.  No nausea, no vomiting.  No diarrhea.  No constipation. Genitourinary: Negative for dysuria. Musculoskeletal: Severe right foot and ankle pain with swelling and bruising Skin: Negative for rash. Neurological: Negative for headaches, focal weakness or numbness.  10-point ROS otherwise negative.  ____________________________________________   PHYSICAL EXAM:  VITAL SIGNS: ED Triage Vitals  Enc Vitals Group     BP 02/13/16 1450 128/70     Pulse Rate 02/13/16 1450 84     Resp 02/13/16 1450 16     Temp 02/13/16 1450 97.3 F (36.3 C)     Temp Source 02/13/16 1450 Oral     SpO2 02/13/16 1450 96 %     Weight 02/13/16 1453 282 lb (127.9 kg)     Height 02/13/16 1453 5\' 6"  (1.676 m)     Head Circumference --      Peak Flow --      Pain Score 02/13/16 1453 8     Pain Loc --      Pain Edu? --      Excl. in [place]? --     Constitutional: Alert and oriented. Well appearing and in no acute distress. Eyes: Conjunctivae are normal. PERRL. EOMI. Head: Atraumatic. Nose: No congestion/rhinnorhea. Mouth/Throat: Mucous membranes are moist.  Oropharynx non-erythematous. Neck: No stridor.  No meningeal signs.  No cervical spine tenderness to palpation. Cardiovascular: Normal rate, regular rhythm. Good peripheral circulation. Grossly normal heart sounds. Respiratory: Normal respiratory  effort.  No retractions. Lungs CTAB. Gastrointestinal: Soft and nontender. No distention.  Musculoskeletal: there is significant swelling and bruising to her entire right foot but worse on the lateral aspect that extends proximally to just above the ankle. She has no tenderness to ptibia/fibula and no tenderness to palpation of the femur.  She has scattered ecchymoses consistent with contusion on her upper and lower right leg, but there is no gross deformity.  She has no pain or tenderness with range of motion of the joints in her right lower extremity except for the ankle which I am unable to range at all without severe pain.  She has normal capillary refill and although I cannot appreciate a palpable pulse given the amount of swelling, her extremity is warm and she is able to  wiggle her toes without difficulty Neurologic:  Normal speech and language. No gross focal neurologic deficits are appreciated.  Skin:  Skin is warm, dry and intact. No rash noted. Psychiatric: Mood and affect are normal. Speech and behavior are normal.  ____________________________________________   LABS (all labs ordered are listed, but only abnormal results are displayed)  Labs Reviewed - No data to display ____________________________________________  EKG  None - EKG not ordered by ED physician ____________________________________________  RADIOLOGY   Dg Ankle Complete Right  Result Date: 02/13/2016 CLINICAL DATA:  Acute right ankle pain following injury today. Initial encounter. EXAM: RIGHT ANKLE - COMPLETE 3+ VIEW COMPARISON:  None. FINDINGS: Soft tissue swelling identified, greatest laterally. No acute fracture, subluxation or dislocation identified. A small calcaneal spur and mild midfoot degenerative changes noted. IMPRESSION: Soft tissue swelling without acute bony abnormality. Electronically Signed   By: Margarette Canada M.D.   On: 02/13/2016 15:35   Dg Foot 2 Views Right  Result Date: 02/13/2016 CLINICAL  DATA:  Acute right foot pain following injury today. Initial encounter. EXAM: RIGHT FOOT - 2 VIEW COMPARISON:  None. FINDINGS: There is no evidence of acute fracture, subluxation or dislocation. Diffuse soft tissue swelling is noted. Mild midfoot degenerative changes are noted as well as a calcaneal spur. IMPRESSION: Diffuse soft tissue swelling without acute bony abnormality. Electronically Signed   By: Margarette Canada M.D.   On: 02/13/2016 15:34    ____________________________________________   PROCEDURES  Procedure(s) performed:   Procedures   Critical Care performed: No ____________________________________________   INITIAL IMPRESSION / ASSESSMENT AND PLAN / ED COURSE  Pertinent labs & imaging results that were available during my care of the patient were reviewed by me and considered in my medical decision making (see chart for details).  Awaiting radiographs.  Personally applied ice pack to affected extremity and elevated it on several towels.  Gave ice chips, ordered morphine (patient has an IV).  Anticipate splint and outpatient follow up.  she has no evidence of any other acute traumatic injury including no injury to heck, pelvis, and proximal right lower extremity.   Clinical Course as of Feb 13 1555  Thu Feb 13, 2016  1548 The patient has no acute bony injury.  She appears to have suffered a significant sprain of her ankle.  I had my usual and customary discussion with her and her mother-in-law who is present at the bedside.I reviewed the patient's prescription history over the last 12 months in the [place] Controlled Substances Database, and she does have a number of prescriptions that have been filled over the last year but most of them are from the same providers including the orthopedist that she reported to me and her last prescription was for Tylenol 3 about a month ago.  Given that she has a painful acute injury I will give her a few Norco but explained to her she needs to follow  up with orthopedics on Monday.  She informed me that she already has an appointment with her podiatrist scheduled Monday which will provide excellent follow-up. Gave RICE recommendations and return precautions.  [CF]    Clinical Course User Index [CF] Hinda Kehr, MD    ____________________________________________  FINAL CLINICAL IMPRESSION(S) / ED DIAGNOSES  Final diagnoses:  Sprain of right ankle, unspecified ligament, initial encounter  Fall (on) (from) other stairs and steps, initial encounter     MEDICATIONS GIVEN DURING THIS VISIT:  Medications  morphine 4 MG/ML injection 4 mg (4 mg Intravenous Given 02/13/16  1541)  ondansetron (ZOFRAN) injection 4 mg (4 mg Intravenous Given 02/13/16 1538)     NEW OUTPATIENT MEDICATIONS STARTED DURING THIS VISIT:  New Prescriptions   DOCUSATE SODIUM (COLACE) 100 MG CAPSULE    Take 1 tablet once or twice daily as needed for constipation while taking narcotic pain medicine   HYDROCODONE-ACETAMINOPHEN (NORCO/VICODIN) 5-325 MG TABLET    Take 1-2 tablets by mouth every 4 (four) hours as needed for moderate pain.    Modified Medications   No medications on file    Discontinued Medications   HYDROCODONE-ACETAMINOPHEN (NORCO/VICODIN) 5-325 MG TABLET    Take 1 tablet by mouth every 4 (four) hours. For knee pain   OXYCODONE-ACETAMINOPHEN (PERCOCET) 5-325 MG TABLET    Take 1 tablet by mouth every 4 (four) hours as needed for severe pain.     Note:  This document was prepared using Dragon voice recognition software and may include unintentional dictation errors.    Hinda Kehr, MD 02/13/16 1556

## 2016-02-17 ENCOUNTER — Ambulatory Visit
Admission: RE | Admit: 2016-02-17 | Discharge: 2016-02-17 | Disposition: A | Discharge status: Home / Self Care | Payer: BLUE CROSS/BLUE SHIELD | Source: Ambulatory Visit | Attending: Podiatry | Admitting: Podiatry

## 2016-02-17 ENCOUNTER — Other Ambulatory Visit: Payer: Self-pay | Admitting: Podiatry

## 2016-02-17 DIAGNOSIS — M79604 Pain in right leg: Secondary | ICD-10-CM

## 2016-02-17 DIAGNOSIS — R609 Edema, unspecified: Secondary | ICD-10-CM | POA: Insufficient documentation

## 2016-03-06 ENCOUNTER — Ambulatory Visit
Admission: EM | Admit: 2016-03-06 | Discharge: 2016-03-06 | Disposition: A | Discharge status: Home / Self Care | Payer: BLUE CROSS/BLUE SHIELD | Attending: Family Medicine | Admitting: Family Medicine

## 2016-03-06 ENCOUNTER — Encounter: Payer: Self-pay | Admitting: Emergency Medicine

## 2016-03-06 DIAGNOSIS — M79661 Pain in right lower leg: Secondary | ICD-10-CM

## 2016-03-06 DIAGNOSIS — M7989 Other specified soft tissue disorders: Secondary | ICD-10-CM | POA: Diagnosis not present

## 2016-03-06 NOTE — ED Triage Notes (Signed)
Patient states that the pain in her right foot and right lower leg is worse than before.  Patient states that she twisted her right ankle 3 weeks ago and has been seeing Dr. Vickki Muff.

## 2016-03-06 NOTE — Discharge Instructions (Signed)
Recommend patient go to ED for further evaluation (further imaging) and management

## 2016-04-08 ENCOUNTER — Other Ambulatory Visit: Payer: Self-pay | Admitting: Obstetrics and Gynecology

## 2016-04-09 LAB — CYTOLOGY - PAP

## 2016-05-18 NOTE — ED Provider Notes (Addendum)
[facility]    CSN: [id number] Arrival date & time: 03/06/16  1841     History   Chief Complaint Chief Complaint  Patient presents with  . Foot Pain    right foot  . Leg Pain    right leg    HPI Tracy [name] is a 51 y.o. female.   51 yo female with a c/o right foot and leg pain for 3 weeks which now seems worse. Denies fevers, chills, redness. States she twisted her foot and ankle about 3 weeks ago and has seen podiatry for this. Also had a lower extremity venous doppler which was negative for DVT.    The history is provided by the patient.  Foot Pain   Leg Pain    Past Medical History:  Diagnosis Date  . Anemia   . Anxiety and depression   . Asthma   . Diverticulosis   . Heart murmur   . IDA (iron deficiency anemia)   . Melasma   . PUD (peptic ulcer disease)     Patient Active Problem List   Diagnosis Date Noted  . Anxiety and depression 01/29/2015  . Airway hyperreactivity 01/29/2015  . Gastroduodenal ulcer 01/29/2015  . Other iron deficiency anemia 01/29/2015  . H/O infectious disease 12/01/2013  . DD (diverticular disease) 10/02/2013  . Cardiac murmur 09/12/2013  . Chloasma 09/12/2013  . Absolute anemia 01/09/2013    Past Surgical History:  Procedure Laterality Date  . ENDOMETRIAL ABLATION W/ NOVASURE    . GASTRIC BYPASS OPEN    . TONSILLECTOMY      OB History    No data available       Home Medications    Prior to Admission medications   Medication Sig Start Date End Date Taking? Authorizing Provider  albuterol (PROVENTIL HFA) 108 (90 BASE) MCG/ACT inhaler Inhale 1 puff into the lungs every 4 (four) hours while awake. Prn shortness of breath 02/16/14 02/17/15  Historical Provider, MD  ALPRAZolam Duanne Moron) 1 MG tablet Take 1 tablet by mouth daily. As needed for anxiety 01/24/15   Historical Provider, MD  Cetirizine HCl 10 MG CAPS Take 1 capsule by mouth daily as needed. allergies    Historical Provider, MD  docusate sodium  (COLACE) 100 MG capsule Take 1 tablet once or twice daily as needed for constipation while taking narcotic pain medicine 02/13/16   Hinda Kehr, MD    Family History Family History  Problem Relation Age of Onset  . Pancreatic cancer Sister 71    died  . Breast cancer Maternal Aunt 60  . COPD Mother   . Anxiety disorder Mother   . Depression Mother   . Diabetes type II Mother   . Obesity Mother   . Crohn's disease Mother   . Coronary artery disease Father   . Diabetes type II Father   . Hypertension Father   . Obesity Sister   . Depression Maternal Grandmother   . Obesity Maternal Grandmother   . Cancer Maternal Grandmother   . Coronary artery disease Maternal Grandmother   . Coronary artery disease Paternal Grandfather   . Leukemia Paternal Grandfather   . Glaucoma Paternal Grandmother   . Hypertension Paternal Grandmother   . Osteoarthritis Paternal Grandmother   . Skin cancer Paternal Grandmother     Social History Social History  Substance Use Topics  . Smoking status: Never Smoker  . Smokeless tobacco: Never Used  . Alcohol use No     Allergies  Nsaids   Review of Systems Review of Systems   Physical Exam Triage Vital Signs ED Triage Vitals  Enc Vitals Group     BP 03/06/16 1855 131/67     Pulse Rate 03/06/16 1855 81     Resp 03/06/16 1855 16     Temp 03/06/16 1855 98.4 F (36.9 C)     Temp Source 03/06/16 1855 Oral     SpO2 03/06/16 1855 98 %     Weight 03/06/16 1853 275 lb (124.7 kg)     Height 03/06/16 1853 5\' 6"  (1.676 m)     Head Circumference --      Peak Flow --      Pain Score 03/06/16 1855 4     Pain Loc --      Pain Edu? --      Excl. in [place]? --    No data found.   Updated Vital Signs BP 131/67 (BP Location: Left Arm)   Pulse 81   Temp 98.4 F (36.9 C) (Oral)   Resp 16   Ht 5\' 6"  (1.676 m)   Wt 275 lb (124.7 kg)   SpO2 98%   BMI 44.39 kg/m   Visual Acuity Right Eye Distance:   Left Eye Distance:   Bilateral  Distance:    Right Eye Near:   Left Eye Near:    Bilateral Near:     Physical Exam  Constitutional: She appears well-developed and well-nourished. No distress.  Musculoskeletal:       Right foot: There is tenderness (mild, diffuse) and swelling (mild, diffuse). There is normal range of motion, no bony tenderness, normal capillary refill, no crepitus, no deformity and no laceration.  Skin: She is not diaphoretic.  Nursing note and vitals reviewed.    UC Treatments / Results  Labs (all labs ordered are listed, but only abnormal results are displayed) Labs Reviewed - No data to display  EKG  EKG Interpretation None       Radiology No results found.  Procedures Procedures (including critical care time)  Medications Ordered in UC Medications - No data to display   Initial Impression / Assessment and Plan / UC Course  I have reviewed the triage vital signs and the nursing notes.  Pertinent labs & imaging results that were available during my care of the patient were reviewed by me and considered in my medical decision making (see chart for details).       Final Clinical Impressions(s) / UC Diagnoses   Final diagnoses:  Pain and swelling of right lower leg    New Prescriptions Discharge Medication List as of 03/06/2016  7:31 PM     1. diagnosis reviewed with patient 2. Recommend supportive treatment with rest, elevation, ice 3. Continue follow up with podiatry 4. Follow-up prn if symptoms worsen or don't improve   Norval Gable, MD 05/18/16 [place], MD 05/18/16 1322

## 2016-06-04 ENCOUNTER — Other Ambulatory Visit: Payer: Self-pay | Admitting: Obstetrics and Gynecology

## 2016-06-09 ENCOUNTER — Ambulatory Visit
Admission: EM | Admit: 2016-06-09 | Discharge: 2016-06-09 | Disposition: A | Discharge status: Home / Self Care | Payer: BLUE CROSS/BLUE SHIELD | Attending: Family Medicine | Admitting: Family Medicine

## 2016-06-09 ENCOUNTER — Other Ambulatory Visit (HOSPITAL_COMMUNITY): Payer: Self-pay | Admitting: Obstetrics and Gynecology

## 2016-06-09 ENCOUNTER — Encounter: Payer: Self-pay | Admitting: *Deleted

## 2016-06-09 ENCOUNTER — Ambulatory Visit (INDEPENDENT_AMBULATORY_CARE_PROVIDER_SITE_OTHER): Payer: BLUE CROSS/BLUE SHIELD

## 2016-06-09 DIAGNOSIS — S5001XA Contusion of right elbow, initial encounter: Secondary | ICD-10-CM

## 2016-06-09 MED ORDER — KETOROLAC TROMETHAMINE 60 MG/2ML IM SOLN
60.0000 mg | Freq: Once | INTRAMUSCULAR | Status: AC
  Administered 2016-06-09: 60 mg via INTRAMUSCULAR

## 2016-06-09 MED ORDER — HYDROCODONE-ACETAMINOPHEN 5-325 MG PO TABS: 1.0000 | ORAL_TABLET | Freq: Three times a day (TID) | ORAL | 0 refills | Status: DC | PRN

## 2016-06-09 NOTE — Discharge Instructions (Signed)
His follow-up PCP if elbow continues to hurt use hydrocodone only if pain is severe

## 2016-06-09 NOTE — ED Triage Notes (Signed)
Patient injured right elbow in a fall today.  Right elbow bruising and swelling are visible.

## 2016-06-09 NOTE — ED Provider Notes (Addendum)
[facility]    CSN: [id number] Arrival date & time: 06/09/16  1219     History   Chief Complaint Chief Complaint  Patient presents with  . Elbow Injury    HPI Tracy [name] is a 51 y.o. female.   Patient states this she fell late this morning at the [facility]. She landed on right elbow. The elbow was not contused bruised and she has difficulty straightening the elbow and moving the elbow. She's had recent fracture of the foot as well. She is a gastric bypass surgery since she's unable to tolerate NSAIDs she seemed had an ulcer she states from NSAIDs before in the past. History of anxiety depression with obesity and anemia. History of heart murmur diverticulosis she's had a long gastric bypass tonsillectomy and endometrial ablation pertinent family medical history relevant to today's visit.   The history is provided by the patient. No language interpreter was used.  Arm Injury  Location:  Elbow Elbow location:  R elbow Injury: yes   Mechanism of injury: fall   Fall:    Fall occurred:  Standing   Impact surface:  Hard floor   Entrapped after fall: no   Pain details:    Quality:  Shooting and throbbing   Severity:  Moderate   Onset quality:  Sudden   Timing:  Constant   Progression:  Worsening Handedness:  Right-handed   Past Medical History:  Diagnosis Date  . Anemia   . Anxiety and depression   . Asthma   . Diverticulosis   . Heart murmur   . IDA (iron deficiency anemia)   . Melasma   . PUD (peptic ulcer disease)     Patient Active Problem List   Diagnosis Date Noted  . Anxiety and depression 01/29/2015  . Airway hyperreactivity 01/29/2015  . Gastroduodenal ulcer 01/29/2015  . Other iron deficiency anemia 01/29/2015  . H/O infectious disease 12/01/2013  . DD (diverticular disease) 10/02/2013  . Cardiac murmur 09/12/2013  . Chloasma 09/12/2013  . Absolute anemia 01/09/2013    Past Surgical History:  Procedure Laterality Date  .  ENDOMETRIAL ABLATION W/ NOVASURE    . GASTRIC BYPASS OPEN    . TONSILLECTOMY      OB History    No data available       Home Medications    Prior to Admission medications   Medication Sig Start Date End Date Taking? Authorizing Provider  Brexpiprazole (REXULTI) 2 MG TABS Take by mouth daily.   Yes Historical Provider, MD  lamoTRIgine (LAMICTAL) 200 MG tablet Take 200 mg by mouth 2 (two) times daily.   Yes Historical Provider, MD  albuterol (PROVENTIL HFA) 108 (90 BASE) MCG/ACT inhaler Inhale 2 puffs into the lungs every 6 (six) hours as needed. Prn shortness of breath 02/16/14 02/17/15  Historical Provider, MD  ALPRAZolam Duanne Moron) 1 MG tablet Take 1 tablet by mouth daily. As needed for anxiety 01/24/15   Historical Provider, MD  Cetirizine HCl 10 MG CAPS Take 1 capsule by mouth daily as needed. allergies    Historical Provider, MD  docusate sodium (COLACE) 100 MG capsule Take 1 tablet once or twice daily as needed for constipation while taking narcotic pain medicine 02/13/16   Hinda Kehr, MD  HYDROcodone-acetaminophen (NORCO) 5-325 MG tablet Take 1 tablet by mouth every 8 (eight) hours as needed for moderate pain. 06/09/16   Frederich Cha, MD    Family History Family History  Problem Relation Age of Onset  . Pancreatic  cancer Sister 73    died  . Breast cancer Maternal Aunt 60  . COPD Mother   . Anxiety disorder Mother   . Depression Mother   . Diabetes type II Mother   . Obesity Mother   . Crohn's disease Mother   . Coronary artery disease Father   . Diabetes type II Father   . Hypertension Father   . Obesity Sister   . Depression Maternal Grandmother   . Obesity Maternal Grandmother   . Cancer Maternal Grandmother   . Coronary artery disease Maternal Grandmother   . Coronary artery disease Paternal Grandfather   . Leukemia Paternal Grandfather   . Glaucoma Paternal Grandmother   . Hypertension Paternal Grandmother   . Osteoarthritis Paternal Grandmother   . Skin cancer  Paternal Grandmother     Social History Social History  Substance Use Topics  . Smoking status: Never Smoker  . Smokeless tobacco: Never Used  . Alcohol use No     Allergies   Nsaids   Review of Systems Review of Systems  Musculoskeletal: Positive for arthralgias, joint swelling and myalgias.  All other systems reviewed and are negative.    Physical Exam Triage Vital Signs ED Triage Vitals  Enc Vitals Group     BP 06/09/16 1307 128/68     Pulse Rate 06/09/16 1307 81     Resp 06/09/16 1307 16     Temp 06/09/16 1307 98.1 F (36.7 C)     Temp Source 06/09/16 1307 Oral     SpO2 06/09/16 1307 100 %     Weight 06/09/16 1309 295 lb (133.8 kg)     Height 06/09/16 1309 5\' 6"  (1.676 m)     Head Circumference --      Peak Flow --      Pain Score 06/09/16 1310 5     Pain Loc --      Pain Edu? --      Excl. in [place]? --    No data found.   Updated Vital Signs BP 128/68 (BP Location: Left Arm)   Pulse 81   Temp 98.1 F (36.7 C) (Oral)   Resp 16   Ht 5\' 6"  (1.676 m)   Wt 295 lb (133.8 kg)   SpO2 100%   BMI 47.61 kg/m   Visual Acuity Right Eye Distance:   Left Eye Distance:   Bilateral Distance:    Right Eye Near:   Left Eye Near:    Bilateral Near:     Physical Exam  Constitutional: She appears well-developed and well-nourished.  HENT:  Head: Normocephalic and atraumatic.  Eyes: Pupils are equal, round, and reactive to light.  Neck: Normal range of motion.  Pulmonary/Chest: Effort normal.  Musculoskeletal: Normal range of motion. She exhibits edema and tenderness.       Right elbow: She exhibits swelling. Tenderness found.  Ecchymosis around the right elbow difficulty straightening the right elbow completely as well.  Neurological: She is alert.  Skin: Skin is warm.  Psychiatric: She has a normal mood and affect.  Vitals reviewed.    UC Treatments / Results  Labs (all labs ordered are listed, but only abnormal results are displayed) Labs Reviewed -  No data to display  EKG  EKG Interpretation None       Radiology Dg Elbow Complete Right  Result Date: 06/09/2016 CLINICAL DATA:  [place] today with bruising and swelling of the right elbow EXAM: RIGHT ELBOW - COMPLETE 3+ VIEW COMPARISON:  None. FINDINGS:  There is soft tissue edema around the right elbow. However no fracture is seen. Alignment is normal. No joint effusion is noted. IMPRESSION: No fracture.  Soft tissue edema. Electronically Signed   By: Ivar Drape M.D.   On: 06/09/2016 14:40    Procedures Procedures (including critical care time)  Medications Ordered in UC Medications  ketorolac (TORADOL) injection 60 mg (60 mg Intramuscular Given 06/09/16 1420)     Initial Impression / Assessment and Plan / UC Course  I have reviewed the triage vital signs and the nursing notes.  Pertinent labs & imaging results that were available during my care of the patient were reviewed by me and considered in my medical decision making (see chart for details).    patient was given 60 mg of Toradol and ice pack was also ordered for the patient. X-ray was negative for fracture. Initially when I saw patient I was going to place her on Percocet since she reported to me that she had not had any narcotics in a while until her  foot was fractured but when she was pulled up at the [facility], drug reporting site she seems to be getting tramadol on a regular basis. When questioned about this she reports this the tramadol is for menstrual cramps and that this worked that well but she is going to have a hysterectomy in the very near future. Even though the tramadol is only for her menstrual cramps is worked well for worked well for her menstrual cramps her lack of complete transparency I gave her only 12 hydrocodone tablets instead for the key pain and we voided the Percocets scrips  and recommended she be very careful with the hydrocodone and tramadol medication and not take them together. Due to her gastric bypass  will not be able to take oral NSAIDs for pain Work note given for today and tomorrow as well.    Final Clinical Impressions(s) / UC Diagnoses   Final diagnoses:  Contusion of right elbow, initial encounter    New Prescriptions Discharge Medication List as of 06/09/2016  2:56 PM    START taking these medications   Details  HYDROcodone-acetaminophen (NORCO) 5-325 MG tablet Take 1 tablet by mouth every 8 (eight) hours as needed for moderate pain., Starting Tue 06/09/2016, Normal         Note: This dictation was prepared with Dragon dictation along with smaller phrase technology. Any transcriptional errors that result from this process are unintentional.   Frederich Cha, MD 06/09/16 1500    Frederich Cha, MD 08/19/16 2027

## 2016-07-27 NOTE — Patient Instructions (Signed)
Your procedure is scheduled on:  Wednesday, Aug 05, 2016  Enter through the [employer] [facility] at:  6:30 AM  Pick up the phone at the desk and dial [phone].  Call this number if you have problems the morning of surgery: [phone].  Remember: Do NOT eat food or drink after:  Midnight Tuesday  Take these medicines the morning of surgery with a SIP OF WATER:  Wellbutrin, Fluvoxamine, Lamotrigine, Xanax if needed  Bring Asthma Inhaler Day of surgery  Stop ALL herbal medications at this time  Do NOT smoke the day of surgery.  Do NOT wear jewelry (body piercing), metal hair clips/bobby pins, make-up, or nail polish. Do NOT wear lotions, powders, or perfumes.  You may wear deodorant. Do NOT shave for 48 hours prior to surgery. Do NOT bring valuables to the hospital. Contacts, dentures, or bridgework may not be worn into surgery.  Leave suitcase in car.  After surgery it may be brought to your room.  For patients admitted to the hospital, checkout time is 11:00 AM the day of discharge.  Bring a copy of your healthcare power of attorney and living will documents.

## 2016-07-28 ENCOUNTER — Encounter (HOSPITAL_COMMUNITY): Payer: Self-pay

## 2016-07-28 ENCOUNTER — Encounter (HOSPITAL_COMMUNITY)
Admission: RE | Admit: 2016-07-28 | Discharge: 2016-07-28 | Disposition: A | Discharge status: Home / Self Care | Payer: BLUE CROSS/BLUE SHIELD | Source: Ambulatory Visit | Attending: Obstetrics and Gynecology | Admitting: Obstetrics and Gynecology

## 2016-07-28 DIAGNOSIS — K279 Peptic ulcer, site unspecified, unspecified as acute or chronic, without hemorrhage or perforation: Secondary | ICD-10-CM | POA: Insufficient documentation

## 2016-07-28 DIAGNOSIS — E329 Disease of thymus, unspecified: Secondary | ICD-10-CM | POA: Insufficient documentation

## 2016-07-28 DIAGNOSIS — Z01812 Encounter for preprocedural laboratory examination: Secondary | ICD-10-CM | POA: Diagnosis not present

## 2016-07-28 DIAGNOSIS — D649 Anemia, unspecified: Secondary | ICD-10-CM | POA: Diagnosis not present

## 2016-07-28 DIAGNOSIS — L811 Chloasma: Secondary | ICD-10-CM | POA: Diagnosis not present

## 2016-07-28 DIAGNOSIS — J45909 Unspecified asthma, uncomplicated: Secondary | ICD-10-CM | POA: Insufficient documentation

## 2016-07-28 DIAGNOSIS — D509 Iron deficiency anemia, unspecified: Secondary | ICD-10-CM | POA: Insufficient documentation

## 2016-07-28 DIAGNOSIS — R011 Cardiac murmur, unspecified: Secondary | ICD-10-CM | POA: Insufficient documentation

## 2016-07-28 DIAGNOSIS — Z8619 Personal history of other infectious and parasitic diseases: Secondary | ICD-10-CM | POA: Insufficient documentation

## 2016-07-28 HISTORY — DX: Headache, unspecified: R51.9

## 2016-07-28 HISTORY — DX: Major depressive disorder, single episode, unspecified: F32.9

## 2016-07-28 HISTORY — DX: Essential (primary) hypertension: I10

## 2016-07-28 HISTORY — DX: Sleep apnea, unspecified: G47.30

## 2016-07-28 HISTORY — DX: Chronic or unspecified peptic ulcer, site unspecified, with perforation: K27.5

## 2016-07-28 HISTORY — DX: Depression, unspecified: F32.A

## 2016-07-28 HISTORY — DX: Unspecified osteoarthritis, unspecified site: M19.90

## 2016-07-28 HISTORY — DX: Anxiety disorder, unspecified: F41.9

## 2016-07-28 HISTORY — DX: Headache: R51

## 2016-07-28 LAB — CBC
HCT: 36.4 % (ref 36.0–46.0)
Hemoglobin: 11.3 g/dL — ABNORMAL LOW (ref 12.0–15.0)
MCH: 25.3 pg — ABNORMAL LOW (ref 26.0–34.0)
MCHC: 31 g/dL (ref 30.0–36.0)
MCV: 81.6 fL (ref 78.0–100.0)
Platelets: 326 10*3/uL (ref 150–400)
RBC: 4.46 MIL/uL (ref 3.87–5.11)
RDW: 16.5 % — ABNORMAL HIGH (ref 11.5–15.5)
WBC: 5.7 10*3/uL (ref 4.0–10.5)

## 2016-08-04 NOTE — H&P (Signed)
51 y.o. yo complains of R&LLQ pain. Per notes in office:"Continued pain and dyspareunia for over a year despite conservative management.  Likely endometriosis.  Offered scope +/- lupron but pt desires definitive after discussion of all options."  Pt has some frequency and urgency but no  SUI.   Ovaries to remain unless abnormal or have endometriosis on them.    Past Medical History:  Diagnosis Date  . Anemia   . Anxiety   . Anxiety and depression   . Arthritis    left knee  . Asthma    hardly uses inhaler  . Depression   . Diverticulosis   . Headache    Migraines  . Heart murmur   . Hypertension    history no longer requires medication management  . IDA (iron deficiency anemia)   . Melasma   . Perforated ulcer ([place])   . PUD (peptic ulcer disease)   . Sleep apnea    mild, no CPAP ordered   Past Surgical History:  Procedure Laterality Date  . CHOLECYSTECTOMY    . COLONOSCOPY    . ENDOMETRIAL ABLATION W/ NOVASURE    . GASTRIC BYPASS OPEN    . perforated ulcer surgery    . TONSILLECTOMY    . UPPER GI ENDOSCOPY      Social History   Social History  . Marital status: Married    Spouse name: N/A  . Number of children: N/A  . Years of education: N/A   Occupational History  . Not on file.   Social History Main Topics  . Smoking status: Never Smoker  . Smokeless tobacco: Never Used  . Alcohol use 0.0 oz/week     Comment: rare  . Drug use: No  . Sexual activity: No   Other Topics Concern  . Not on file   Social History Narrative  . No narrative on file    No current facility-administered medications on file prior to encounter.    Current Outpatient Prescriptions on File Prior to Encounter  Medication Sig Dispense Refill  . ALPRAZolam (XANAX) 1 MG tablet Take 1 tablet by mouth daily as needed for anxiety. As needed for anxiety  0  . Cetirizine HCl 10 MG CAPS Take 1 capsule by mouth daily as needed (allergies). allergies    . docusate sodium (COLACE) 100 MG  capsule Take 1 tablet once or twice daily as needed for constipation while taking narcotic pain medicine (Patient not taking: Reported on 07/24/2016) 30 capsule 0    Allergies  Allergen Reactions  . Doxepin Other (See Comments)    sweats  . Ibuprofen Other (See Comments)    Perforated ulcer  . Morphine Hives  . Nsaids Other (See Comments)    H/o ulcer  . Trazodone Other (See Comments)    Didn't work for sleep Causes headaches    @VITALS2 @  Lungs: clear to ascultation Cor:  RRR Abdomen:  soft, nontender, nondistended. Ex:  no cords, erythema Pelvic:  Normal size and shape of uterus.  Normal vagina and cervix.  No masses, normal shape uterus.  [place] was without abnormalities.  A:  For robo TLH/ salpingectomies, possible BSO, cysto.   P: All risks, benefits and alternatives d/w patient and she desires to proceed.  Patient has undergone a modified bowel prep and will receive preop antibiotics and SCDs during the operation.     Aarilyn Dye A

## 2016-08-05 ENCOUNTER — Ambulatory Visit (HOSPITAL_COMMUNITY): Payer: BLUE CROSS/BLUE SHIELD | Admitting: Anesthesiology

## 2016-08-05 ENCOUNTER — Observation Stay (HOSPITAL_COMMUNITY)
Admission: RE | Admit: 2016-08-05 | Discharge: 2016-08-06 | Disposition: A | Discharge status: Home / Self Care | Payer: BLUE CROSS/BLUE SHIELD | Source: Ambulatory Visit | Attending: Obstetrics and Gynecology | Admitting: Obstetrics and Gynecology

## 2016-08-05 ENCOUNTER — Encounter (HOSPITAL_COMMUNITY): Payer: Self-pay | Admitting: *Deleted

## 2016-08-05 ENCOUNTER — Encounter (HOSPITAL_COMMUNITY)
Admission: RE | Disposition: A | Discharge status: Home / Self Care | Payer: Self-pay | Source: Ambulatory Visit | Attending: Obstetrics and Gynecology

## 2016-08-05 DIAGNOSIS — R1031 Right lower quadrant pain: Secondary | ICD-10-CM | POA: Diagnosis not present

## 2016-08-05 DIAGNOSIS — N736 Female pelvic peritoneal adhesions (postinfective): Secondary | ICD-10-CM | POA: Diagnosis not present

## 2016-08-05 DIAGNOSIS — Z9884 Bariatric surgery status: Secondary | ICD-10-CM | POA: Insufficient documentation

## 2016-08-05 DIAGNOSIS — Z8711 Personal history of peptic ulcer disease: Secondary | ICD-10-CM | POA: Diagnosis not present

## 2016-08-05 DIAGNOSIS — F329 Major depressive disorder, single episode, unspecified: Secondary | ICD-10-CM | POA: Insufficient documentation

## 2016-08-05 DIAGNOSIS — Z79899 Other long term (current) drug therapy: Secondary | ICD-10-CM | POA: Insufficient documentation

## 2016-08-05 DIAGNOSIS — Z888 Allergy status to other drugs, medicaments and biological substances status: Secondary | ICD-10-CM | POA: Insufficient documentation

## 2016-08-05 DIAGNOSIS — N838 Other noninflammatory disorders of ovary, fallopian tube and broad ligament: Secondary | ICD-10-CM | POA: Diagnosis not present

## 2016-08-05 DIAGNOSIS — Z6841 Body Mass Index (BMI) 40.0 and over, adult: Secondary | ICD-10-CM | POA: Diagnosis not present

## 2016-08-05 DIAGNOSIS — F419 Anxiety disorder, unspecified: Secondary | ICD-10-CM | POA: Diagnosis not present

## 2016-08-05 DIAGNOSIS — R1032 Left lower quadrant pain: Principal | ICD-10-CM | POA: Insufficient documentation

## 2016-08-05 DIAGNOSIS — J45909 Unspecified asthma, uncomplicated: Secondary | ICD-10-CM | POA: Diagnosis not present

## 2016-08-05 DIAGNOSIS — Z885 Allergy status to narcotic agent status: Secondary | ICD-10-CM | POA: Diagnosis not present

## 2016-08-05 DIAGNOSIS — N72 Inflammatory disease of cervix uteri: Secondary | ICD-10-CM | POA: Diagnosis not present

## 2016-08-05 DIAGNOSIS — Z886 Allergy status to analgesic agent status: Secondary | ICD-10-CM | POA: Diagnosis not present

## 2016-08-05 DIAGNOSIS — Z9889 Other specified postprocedural states: Secondary | ICD-10-CM

## 2016-08-05 HISTORY — PX: CYSTOSCOPY: SHX5120

## 2016-08-05 HISTORY — PX: ROBOTIC ASSISTED TOTAL HYSTERECTOMY WITH SALPINGECTOMY: SHX6679

## 2016-08-05 HISTORY — PX: LYSIS OF ADHESION: SHX5961

## 2016-08-05 LAB — TYPE AND SCREEN
ABO/RH(D): A POS
Antibody Screen: NEGATIVE

## 2016-08-05 LAB — PREGNANCY, URINE: Preg Test, Ur: NEGATIVE

## 2016-08-05 LAB — ABO/RH: ABO/RH(D): A POS

## 2016-08-05 SURGERY — ROBOTIC ASSISTED TOTAL HYSTERECTOMY WITH SALPINGECTOMY
Anesthesia: General | Site: Bladder

## 2016-08-05 MED ORDER — ONDANSETRON HCL 4 MG/2ML IJ SOLN: 4.0000 mg | Freq: Four times a day (QID) | INTRAMUSCULAR | Status: DC | PRN

## 2016-08-05 MED ORDER — ALPRAZOLAM 0.5 MG PO TABS
1.0000 mg | ORAL_TABLET | Freq: Every day | ORAL | Status: DC | PRN
Start: 2016-08-05 — End: 2016-08-06

## 2016-08-05 MED ORDER — ROCURONIUM BROMIDE 100 MG/10ML IV SOLN
INTRAVENOUS | Status: DC | PRN
  Administered 2016-08-05: 10 mg via INTRAVENOUS
  Administered 2016-08-05: 60 mg via INTRAVENOUS
  Administered 2016-08-05: 10 mg via INTRAVENOUS

## 2016-08-05 MED ORDER — ONDANSETRON HCL 4 MG PO TABS: 4.0000 mg | ORAL_TABLET | Freq: Four times a day (QID) | ORAL | Status: DC | PRN

## 2016-08-05 MED ORDER — LORATADINE 10 MG PO TABS
10.0000 mg | ORAL_TABLET | Freq: Every day | ORAL | Status: DC
  Filled 2016-08-05 (×2): qty 1

## 2016-08-05 MED ORDER — ALBUTEROL SULFATE HFA 108 (90 BASE) MCG/ACT IN AERS: 2.0000 | INHALATION_SPRAY | Freq: Four times a day (QID) | RESPIRATORY_TRACT | Status: DC | PRN

## 2016-08-05 MED ORDER — MIDAZOLAM HCL 2 MG/2ML IJ SOLN
INTRAMUSCULAR | Status: DC | PRN
  Administered 2016-08-05: 2 mg via INTRAVENOUS

## 2016-08-05 MED ORDER — DEXTROSE 5 % IV SOLN
3.0000 g | INTRAVENOUS | Status: AC
  Administered 2016-08-05: 3 g via INTRAVENOUS
  Filled 2016-08-05: qty 3000

## 2016-08-05 MED ORDER — HYDROMORPHONE HCL 1 MG/ML IJ SOLN
INTRAMUSCULAR | Status: AC
  Filled 2016-08-05: qty 1

## 2016-08-05 MED ORDER — ROPIVACAINE HCL 5 MG/ML IJ SOLN
INTRAMUSCULAR | Status: AC
  Filled 2016-08-05: qty 30

## 2016-08-05 MED ORDER — HYDROMORPHONE HCL 1 MG/ML IJ SOLN
INTRAMUSCULAR | Status: DC | PRN
  Administered 2016-08-05: 1 mg via INTRAVENOUS

## 2016-08-05 MED ORDER — LIDOCAINE HCL (CARDIAC) 20 MG/ML IV SOLN
INTRAVENOUS | Status: AC
  Filled 2016-08-05: qty 5

## 2016-08-05 MED ORDER — OXYCODONE-ACETAMINOPHEN 5-325 MG PO TABS
1.0000 | ORAL_TABLET | ORAL | Status: DC | PRN
  Administered 2016-08-05: 2 via ORAL
  Administered 2016-08-05: 1 via ORAL
  Administered 2016-08-05 – 2016-08-06 (×4): 2 via ORAL
  Filled 2016-08-05 (×3): qty 2
  Filled 2016-08-05: qty 1
  Filled 2016-08-05 (×2): qty 2

## 2016-08-05 MED ORDER — SODIUM CHLORIDE 0.9 % IJ SOLN
INTRAMUSCULAR | Status: AC
  Filled 2016-08-05: qty 20

## 2016-08-05 MED ORDER — FENTANYL CITRATE (PF) 250 MCG/5ML IJ SOLN
INTRAMUSCULAR | Status: AC
  Filled 2016-08-05: qty 5

## 2016-08-05 MED ORDER — FENTANYL CITRATE (PF) 100 MCG/2ML IJ SOLN
25.0000 ug | INTRAMUSCULAR | Status: DC | PRN
  Administered 2016-08-05: 50 ug via INTRAVENOUS
  Administered 2016-08-05: 25 ug via INTRAVENOUS
  Administered 2016-08-05: 50 ug via INTRAVENOUS

## 2016-08-05 MED ORDER — SCOPOLAMINE 1 MG/3DAYS TD PT72
1.0000 | MEDICATED_PATCH | Freq: Once | TRANSDERMAL | Status: DC
  Administered 2016-08-05: 1.5 mg via TRANSDERMAL

## 2016-08-05 MED ORDER — LACTATED RINGERS IV SOLN
INTRAVENOUS | Status: DC
Start: 2016-08-05 — End: 2016-08-05
  Administered 2016-08-05 (×2): via INTRAVENOUS

## 2016-08-05 MED ORDER — LAMOTRIGINE 200 MG PO TABS
200.0000 mg | ORAL_TABLET | Freq: Two times a day (BID) | ORAL | Status: DC
  Administered 2016-08-06: 200 mg via ORAL
  Filled 2016-08-05 (×2): qty 1

## 2016-08-05 MED ORDER — SODIUM CHLORIDE 0.9 % IV SOLN
INTRAVENOUS | Status: DC | PRN
  Administered 2016-08-05: 60 mL

## 2016-08-05 MED ORDER — ALBUTEROL SULFATE HFA 108 (90 BASE) MCG/ACT IN AERS
INHALATION_SPRAY | RESPIRATORY_TRACT | Status: DC | PRN
  Administered 2016-08-05: 2 via RESPIRATORY_TRACT

## 2016-08-05 MED ORDER — MIDAZOLAM HCL 2 MG/2ML IJ SOLN
INTRAMUSCULAR | Status: AC
  Filled 2016-08-05: qty 2

## 2016-08-05 MED ORDER — OXYCODONE HCL 5 MG PO TABS: 5.0000 mg | ORAL_TABLET | Freq: Once | ORAL | Status: DC | PRN

## 2016-08-05 MED ORDER — FENTANYL CITRATE (PF) 100 MCG/2ML IJ SOLN
INTRAMUSCULAR | Status: AC
  Administered 2016-08-05: 25 ug via INTRAVENOUS
  Filled 2016-08-05: qty 2

## 2016-08-05 MED ORDER — SODIUM CHLORIDE 0.9 % IJ SOLN
INTRAMUSCULAR | Status: AC
  Filled 2016-08-05: qty 10

## 2016-08-05 MED ORDER — KETOROLAC TROMETHAMINE 30 MG/ML IJ SOLN: 30.0000 mg | Freq: Four times a day (QID) | INTRAMUSCULAR | Status: DC

## 2016-08-05 MED ORDER — SCOPOLAMINE 1 MG/3DAYS TD PT72
MEDICATED_PATCH | TRANSDERMAL | Status: AC
  Administered 2016-08-05: 1.5 mg via TRANSDERMAL
  Filled 2016-08-05: qty 1

## 2016-08-05 MED ORDER — IBUPROFEN 800 MG PO TABS
800.0000 mg | ORAL_TABLET | Freq: Three times a day (TID) | ORAL | Status: DC | PRN
  Administered 2016-08-06: 800 mg via ORAL
  Filled 2016-08-05: qty 1

## 2016-08-05 MED ORDER — FLUVOXAMINE MALEATE 100 MG PO TABS
100.0000 mg | ORAL_TABLET | Freq: Two times a day (BID) | ORAL | Status: DC
  Administered 2016-08-06: 100 mg via ORAL
  Filled 2016-08-05 (×2): qty 1

## 2016-08-05 MED ORDER — ONDANSETRON HCL 4 MG/2ML IJ SOLN
INTRAMUSCULAR | Status: AC
  Filled 2016-08-05: qty 2

## 2016-08-05 MED ORDER — SUGAMMADEX SODIUM 200 MG/2ML IV SOLN
INTRAVENOUS | Status: AC
  Filled 2016-08-05: qty 2

## 2016-08-05 MED ORDER — BUPROPION HCL 75 MG PO TABS
75.0000 mg | ORAL_TABLET | Freq: Every day | ORAL | Status: DC
  Administered 2016-08-06: 75 mg via ORAL
  Filled 2016-08-05: qty 1

## 2016-08-05 MED ORDER — FENTANYL CITRATE (PF) 100 MCG/2ML IJ SOLN
INTRAMUSCULAR | Status: AC
  Administered 2016-08-05: 50 ug via INTRAVENOUS
  Filled 2016-08-05: qty 2

## 2016-08-05 MED ORDER — AMITRIPTYLINE HCL 25 MG PO TABS
25.0000 mg | ORAL_TABLET | Freq: Every evening | ORAL | Status: DC | PRN
  Filled 2016-08-05: qty 1

## 2016-08-05 MED ORDER — PROPOFOL 10 MG/ML IV BOLUS
INTRAVENOUS | Status: DC | PRN
  Administered 2016-08-05: 260 mg via INTRAVENOUS

## 2016-08-05 MED ORDER — DEXAMETHASONE SODIUM PHOSPHATE 10 MG/ML IJ SOLN
INTRAMUSCULAR | Status: AC
  Filled 2016-08-05: qty 1

## 2016-08-05 MED ORDER — FENTANYL CITRATE (PF) 100 MCG/2ML IJ SOLN
INTRAMUSCULAR | Status: DC | PRN
  Administered 2016-08-05: 100 ug via INTRAVENOUS
  Administered 2016-08-05: 50 ug via INTRAVENOUS
  Administered 2016-08-05 (×2): 100 ug via INTRAVENOUS

## 2016-08-05 MED ORDER — PROPOFOL 10 MG/ML IV BOLUS
INTRAVENOUS | Status: AC
  Filled 2016-08-05: qty 40

## 2016-08-05 MED ORDER — LACTATED RINGERS IR SOLN
Status: DC | PRN
  Administered 2016-08-05: 3000 mL

## 2016-08-05 MED ORDER — OXYCODONE HCL 5 MG/5ML PO SOLN
5.0000 mg | Freq: Once | ORAL | Status: DC | PRN
  Filled 2016-08-05: qty 5

## 2016-08-05 MED ORDER — DEXAMETHASONE SODIUM PHOSPHATE 10 MG/ML IJ SOLN
INTRAMUSCULAR | Status: DC | PRN
  Administered 2016-08-05: 4 mg via INTRAVENOUS

## 2016-08-05 MED ORDER — FENTANYL CITRATE (PF) 100 MCG/2ML IJ SOLN
INTRAMUSCULAR | Status: AC
  Filled 2016-08-05: qty 2

## 2016-08-05 MED ORDER — GLYCOPYRROLATE 0.2 MG/ML IJ SOLN
INTRAMUSCULAR | Status: DC | PRN
  Administered 2016-08-05: 0.2 mg via INTRAVENOUS

## 2016-08-05 MED ORDER — ONDANSETRON HCL 4 MG/2ML IJ SOLN
INTRAMUSCULAR | Status: DC | PRN
Start: 2016-08-05 — End: 2016-08-05
  Administered 2016-08-05: 4 mg via INTRAVENOUS

## 2016-08-05 MED ORDER — ROCURONIUM BROMIDE 100 MG/10ML IV SOLN
INTRAVENOUS | Status: AC
  Filled 2016-08-05: qty 1

## 2016-08-05 MED ORDER — SUGAMMADEX SODIUM 200 MG/2ML IV SOLN
INTRAVENOUS | Status: DC | PRN
  Administered 2016-08-05: 200 mg via INTRAVENOUS

## 2016-08-05 MED ORDER — MENTHOL 3 MG MT LOZG: 1.0000 | LOZENGE | OROMUCOSAL | Status: DC | PRN

## 2016-08-05 MED ORDER — STERILE WATER FOR IRRIGATION IR SOLN
Status: DC | PRN
  Administered 2016-08-05: 1000 mL via INTRAVESICAL

## 2016-08-05 MED ORDER — LIDOCAINE HCL (CARDIAC) 20 MG/ML IV SOLN
INTRAVENOUS | Status: DC | PRN
  Administered 2016-08-05: 100 mg via INTRAVENOUS

## 2016-08-05 SURGICAL SUPPLY — 70 items
ADH SKN CLS APL DERMABOND .7 (GAUZE/BANDAGES/DRESSINGS) ×3
APL SRG 38 LTWT LNG FL B (MISCELLANEOUS) ×3
APPLICATOR ARISTA FLEXITIP XL (MISCELLANEOUS) ×1 IMPLANT
BARRIER ADHS 3X4 INTERCEED (GAUZE/BANDAGES/DRESSINGS) IMPLANT
BRR ADH 4X3 ABS CNTRL BYND (GAUZE/BANDAGES/DRESSINGS)
CANISTER SUCT 3000ML PPV (MISCELLANEOUS) ×4 IMPLANT
CATH FOLEY 2WAY SLVR  5CC 16FR (CATHETERS) ×1
CATH FOLEY 2WAY SLVR 5CC 16FR (CATHETERS) ×3 IMPLANT
CLOTH BEACON ORANGE TIMEOUT ST (SAFETY) ×4 IMPLANT
CONT PATH 16OZ SNAP LID 3702 (MISCELLANEOUS) ×4 IMPLANT
COVER BACK TABLE 60X90IN (DRAPES) ×8 IMPLANT
COVER TIP SHEARS 8 DVNC (MISCELLANEOUS) ×3 IMPLANT
COVER TIP SHEARS 8MM DA VINCI (MISCELLANEOUS) ×1
DECANTER SPIKE VIAL GLASS SM (MISCELLANEOUS) ×8 IMPLANT
DEFOGGER SCOPE WARMER CLEARIFY (MISCELLANEOUS) ×1 IMPLANT
DERMABOND ADVANCED (GAUZE/BANDAGES/DRESSINGS) ×1
DERMABOND ADVANCED .7 DNX12 (GAUZE/BANDAGES/DRESSINGS) ×3 IMPLANT
DRAPE ROBOTICS STRL (DRAPES) ×1 IMPLANT
DURAPREP 26ML APPLICATOR (WOUND CARE) ×4 IMPLANT
ELECT REM PT RETURN 9FT ADLT (ELECTROSURGICAL) ×4
ELECTRODE REM PT RTRN 9FT ADLT (ELECTROSURGICAL) ×3 IMPLANT
GAUZE VASELINE 3X9 (GAUZE/BANDAGES/DRESSINGS) IMPLANT
GLOVE BIO SURGEON STRL SZ7 (GLOVE) ×12 IMPLANT
GLOVE BIOGEL PI IND STRL 7.0 (GLOVE) ×6 IMPLANT
GLOVE BIOGEL PI INDICATOR 7.0 (GLOVE) ×2
GOWN STRL REUS W/TWL LRG LVL3 (GOWN DISPOSABLE) ×8 IMPLANT
GRASPER BIPOLAR FEN DA VINCI (INSTRUMENTS)
GRASPER BPLR FEN DVNC (INSTRUMENTS) IMPLANT
HEMOSTAT ARISTA ABSORB 3G PWDR (MISCELLANEOUS) ×1 IMPLANT
KIT ACCESSORY DA VINCI DISP (KITS) ×1
KIT ACCESSORY DVNC DISP (KITS) ×3 IMPLANT
LEGGING LITHOTOMY PAIR STRL (DRAPES) ×4 IMPLANT
MANIPULATOR ADVINCU DEL 2.5 PL (MISCELLANEOUS) IMPLANT
MANIPULATOR ADVINCU DEL 3.0 PL (MISCELLANEOUS) ×1 IMPLANT
MANIPULATOR ADVINCU DEL 3.5 PL (MISCELLANEOUS) IMPLANT
MANIPULATOR ADVINCU DEL 4.0 PL (MISCELLANEOUS) IMPLANT
NEEDLE INSUFFLATION 120MM (ENDOMECHANICALS) ×4 IMPLANT
OCCLUDER COLPOPNEUMO (BALLOONS) ×4 IMPLANT
PACK ROBOT WH (CUSTOM PROCEDURE TRAY) ×4 IMPLANT
PACK ROBOTIC GOWN (GOWN DISPOSABLE) ×4 IMPLANT
PACK TRENDGUARD 450 HYBRID PRO (MISCELLANEOUS) IMPLANT
PACK TRENDGUARD 600 HYBRD PROC (MISCELLANEOUS) IMPLANT
PAD PREP 24X48 CUFFED NSTRL (MISCELLANEOUS) ×4 IMPLANT
POUCH LAPAROSCOPIC INSTRUMENT (MISCELLANEOUS) ×4 IMPLANT
PROTECTOR NERVE ULNAR (MISCELLANEOUS) ×8 IMPLANT
SET CYSTO W/LG BORE CLAMP LF (SET/KITS/TRAYS/PACK) ×4 IMPLANT
SET IRRIG TUBING LAPAROSCOPIC (IRRIGATION / IRRIGATOR) ×4 IMPLANT
SET TRI-LUMEN FLTR TB AIRSEAL (TUBING) ×4 IMPLANT
SUT DVC VLOC 180 0 12IN GS21 (SUTURE)
SUT VIC AB 2-0 CT2 27 (SUTURE) ×8 IMPLANT
SUT VIC AB 2-0 UR6 27 (SUTURE) ×4 IMPLANT
SUT VICRYL RAPIDE 3 0 (SUTURE) ×8 IMPLANT
SUT VLOC 180 0 9IN  GS21 (SUTURE) ×1
SUT VLOC 180 0 9IN GS21 (SUTURE) IMPLANT
SUTURE DVC VLC 180 0 12IN GS21 (SUTURE) IMPLANT
SYR 50ML LL SCALE MARK (SYRINGE) ×5 IMPLANT
TIP RUMI ORANGE 6.7MMX12CM (TIP) IMPLANT
TIP UTERINE 5.1X6CM LAV DISP (MISCELLANEOUS) IMPLANT
TIP UTERINE 6.7X10CM GRN DISP (MISCELLANEOUS) IMPLANT
TIP UTERINE 6.7X6CM WHT DISP (MISCELLANEOUS) IMPLANT
TIP UTERINE 6.7X8CM BLUE DISP (MISCELLANEOUS) IMPLANT
TOWEL OR 17X24 6PK STRL BLUE (TOWEL DISPOSABLE) ×12 IMPLANT
TRENDGUARD 450 HYBRID PRO PACK (MISCELLANEOUS)
TRENDGUARD 600 HYBRID PROC PK (MISCELLANEOUS) ×4
TROCAR DILATING TIP 12MM 150MM (ENDOMECHANICALS) ×4 IMPLANT
TROCAR DISP BLADELESS 8 DVNC (TROCAR) ×3 IMPLANT
TROCAR DISP BLADELESS 8MM (TROCAR) ×1
TROCAR PORT AIRSEAL 5X120 (TROCAR) ×1 IMPLANT
TROCAR PORT AIRSEAL 8X120 (TROCAR) IMPLANT
WATER STERILE IRR 1000ML POUR (IV SOLUTION) ×6 IMPLANT

## 2016-08-05 NOTE — Anesthesia Procedure Notes (Signed)
Procedure Name: Intubation Date/Time: 08/05/2016 7:27 AM Performed by: Jonna Munro Pre-anesthesia Checklist: Patient identified, Emergency Drugs available, Suction available, Timeout performed and Patient being monitored Patient Re-evaluated:Patient Re-evaluated prior to inductionOxygen Delivery Method: Circle system utilized Preoxygenation: Pre-oxygenation with 100% oxygen Intubation Type: IV induction Ventilation: Mask ventilation without difficulty Laryngoscope Size: Mac and 3 Tube size: 7.0 mm Number of attempts: 1 Airway Equipment and Method: Stylet Placement Confirmation: ETT inserted through vocal cords under direct vision,  positive ETCO2 and breath sounds checked- equal and bilateral Secured at: 21 cm Tube secured with: Tape Dental Injury: Teeth and Oropharynx as per pre-operative assessment

## 2016-08-05 NOTE — Brief Op Note (Signed)
08/05/2016  9:33 AM  PATIENT:  Tracy [name]  51 y.o. female  PRE-OPERATIVE DIAGNOSIS:  Right and Left Lower Quadrant Pain  POST-OPERATIVE DIAGNOSIS:  Right and Left Lower Quadrant Pain  PROCEDURE:  Procedure(s): ROBOTIC ASSISTED TOTAL HYSTERECTOMY WITH SALPINGECTOMY (Bilateral) CYSTOSCOPY (N/A) LYSIS OF ADHESION (N/A)  SURGEON:  Surgeon(s) and Role:    * Bobbye Charleston, MD - Primary    * Jerelyn Charles, MD - Assisting  ANESTHESIA:   general  EBL:  Total I/O In: -  Out: 375 [Urine:275; Blood:100]   LOCAL MEDICATIONS USED:  OTHER ropivicaine intraabdominal, aristas  SPECIMEN:  Source of Specimen:  uterus, cervix, tubes  DISPOSITION OF SPECIMEN:  PATHOLOGY  COUNTS:  YES  TOURNIQUET:  * No tourniquets in log *  DICTATION: .Note written in EPIC  PLAN OF CARE: Admit for overnight observation  PATIENT DISPOSITION:  PACU - hemodynamically stable.   Delay start of Pharmacological VTE agent (>24hrs) due to surgical blood loss or risk of bleeding: not applicable  Complications:  None.  Findings:  8 weeks size uterus.  R and L ovaries were normal, without endometriosis and left in place. There were filmy adhesions in the cul de sac that were attached the colon- these were removed with cautery.The ureters were identified during multiple points of the case and were always out of the field of dissection.  On cystoscopy, the bladder was intact and bilateral spill was seen from each ureteral orriface.    Technique:  After adequate anesthesia was achieved the patient was positioned, prepped and draped in usual sterile fashion.  A speculum was placed in the vagina and the cervix dilated with pratt dilators.  The  3 cm Koh ring  Advincula manipulator was placed in proper fashion.  The  Speculum was removed and the bladder catheterized with a foley.    Attention was turned to the abdomen where a 1 cm incision was made 1 cm above the umbilicus. The long 12 mm optiview trocar was  placed with direct visualization into the abdomen without complication and with the aid of two towel clips on either side of the umbilicus.  The other three trocar sites were marked out, all approximately 10 cm from each other and the camera.  Two 8.5 mm trocars were placed on either side of the camera port and a 5 mm airseal assistant port was placed 3 cm above the plane of the other trocars. All trocars were inserted under direct visualization of the camera.  The patient was placed in trendelenburg and then the Robot docked.  The PK forceps were placed on arm 2 and the Hot shears on arm 1 and introduced under direct visualization of the camera.  I then broke scrub and sat down at the console.  The above findings were noted and the ureters identified well out of the field of dissection.  The right fallopian tube was isolated and cauterized with the PK.  The Utero-ovarian ligament was then divided with the PK cautery and shears.  The posterior broad ligament was then divided with the hot shears until the uterosacral ligament.  The Broad and cardinal ligaments were then cauterized against the cervix to the level of the Koh ring, securing the uterine artery.  Each pedicle was then incised with the shears.  The anterior leaf was then incised at the reflection of the vessico-uterine junction and the lateral bladder retracted inferiorly after the round ligament had been divided with the PK forceps.  The left tube was  cauterized with the PK and divided with the shears;  then the left utero-ovarian ligament divided with the PK forceps and the scissors.  The round ligament was divided as well and the posterior leaf of the broad ligament then divided with the hot shears. The broad and cardinal ligaments were then cauterized on the left in the same way.   At the level of the internal os, the uterine arteries were bilaterally cauterized with the PK.  The ureters were identified well out of the field of dissection.  .   The  bladder was then able to be retracted inferiorly and the vesico-uterine fascia was incised in the midline until the bladder was removed one cm below the Koh ring.  The hot shears then circumferentially incised the vagina at the level of the reflection on the [facility] ring.  Once the uterus and cervix were amputated, cautery was used to insure hemostasis of the cuff.  Once hemostasis was achieved, the instruments were changed to the mega needle driver and mega suture cut needle driver and the cuff was closed with a running stitches of 0-vicryl V loc. Cautery was used to ensure hemostasis of the vaginal cuff very superficially.  The ureters were peristalsing bilaterally well and very lateral to the areas of operation.    The Robot was then undocked and I scrubbed back in.  The needle was removed, Arista placed over the raw areas and the cul de sac and Ropivicaine was introduced into the pelvis.  The fascia of the 12 mm trocar was closed with a figure of 8 stitch of 0 vicryl.  The skin incisions were closed with subcuticular stitches of 3-0 vicryl Rapide and Dermabond.  All instruments were removed from the vagina and cystoscopy performed, revealing an intact bladder and vigourous spill of urine from each ureteral orifice.  The cystoscope was removed and the patient taken to the recovery room in stable condition.  Derra Shartzer A

## 2016-08-05 NOTE — Anesthesia Preprocedure Evaluation (Signed)
Anesthesia Evaluation  Patient identified by MRN, date of birth, ID band Patient awake    Reviewed: Allergy & Precautions, NPO status , Patient's Chart, lab work & pertinent test results  Airway Mallampati: II  TM Distance: >3 FB Neck ROM: Full    Dental  (+) Teeth Intact   Pulmonary neg shortness of breath, asthma , sleep apnea , neg recent URI,    breath sounds clear to auscultation       Cardiovascular hypertension,  Rhythm:Regular     Neuro/Psych  Headaches, PSYCHIATRIC DISORDERS Anxiety Depression    GI/Hepatic Neg liver ROS, PUD,   Endo/Other  Morbid obesity  Renal/GU negative Renal ROS     Musculoskeletal  (+) Arthritis ,   Abdominal   Peds  Hematology negative hematology ROS (+)   Anesthesia Other Findings   Reproductive/Obstetrics                             Anesthesia Physical Anesthesia Plan  ASA: III  Anesthesia Plan: General   Post-op Pain Management:    Induction: Intravenous  Airway Management Planned: Oral ETT  Additional Equipment: None  Intra-op Plan:   Post-operative Plan: Extubation in OR  Informed Consent: I have reviewed the patients History and Physical, chart, labs and discussed the procedure including the risks, benefits and alternatives for the proposed anesthesia with the patient or authorized representative who has indicated his/her understanding and acceptance.     Plan Discussed with: CRNA and Surgeon  Anesthesia Plan Comments:         Anesthesia Quick Evaluation

## 2016-08-05 NOTE — Transfer of Care (Signed)
Immediate Anesthesia Transfer of Care Note  Patient: Tracy [name]  Procedure(s) Performed: Procedure(s): ROBOTIC ASSISTED TOTAL HYSTERECTOMY WITH SALPINGECTOMY (Bilateral) CYSTOSCOPY (N/A) LYSIS OF ADHESION (N/A)  Patient Location: PACU  Anesthesia Type:General  Level of Consciousness: awake, alert  and oriented  Airway & Oxygen Therapy: Patient Spontanous Breathing and Patient connected to nasal cannula oxygen  Post-op Assessment: Report given to RN and Post -op Vital signs reviewed and stable  Post vital signs: Reviewed and stable  Last Vitals:  Vitals:   08/05/16 0614  BP: 120/67  Pulse: 62  Resp: 16  Temp: 36.6 C    Last Pain:  Vitals:   08/05/16 0614  TempSrc: Oral  PainSc: 2       Patients Stated Pain Goal: 4 (56/15/37 9432)  Complications: No apparent anesthesia complications

## 2016-08-05 NOTE — Progress Notes (Signed)
There has been no change in the patients history, status or exam since the history and physical.  Vitals:   08/05/16 0614  BP: 120/67  Pulse: 62  Resp: 16  Temp: 97.9 F (36.6 C)  TempSrc: Oral  SpO2: 99%  Weight: (!) 309 lb (140.2 kg)  Height: 5' 5.5" (1.664 m)    Lab Results  Component Value Date   WBC 5.7 07/28/2016   HGB 11.3 (L) 07/28/2016   HCT 36.4 07/28/2016   MCV 81.6 07/28/2016   PLT 326 07/28/2016  UPT neg Tracy [name] A

## 2016-08-05 NOTE — Progress Notes (Signed)
Pt, o2 sat 89-90 on r/a pt. Was sleeping quietly when checked pt. Placed on 2l o2 and sat up to 98% will cont. o2 at present time

## 2016-08-05 NOTE — Op Note (Signed)
08/05/2016  9:33 AM  PATIENT:  Tracy [name]  51 y.o. female  PRE-OPERATIVE DIAGNOSIS:  Right and Left Lower Quadrant Pain  POST-OPERATIVE DIAGNOSIS:  Right and Left Lower Quadrant Pain  PROCEDURE:  Procedure(s): ROBOTIC ASSISTED TOTAL HYSTERECTOMY WITH SALPINGECTOMY (Bilateral) CYSTOSCOPY (N/A) LYSIS OF ADHESION (N/A)  SURGEON:  Surgeon(s) and Role:    * Bobbye Charleston, MD - Primary    * Jerelyn Charles, MD - Assisting  ANESTHESIA:   general  EBL:  Total I/O In: -  Out: 375 [Urine:275; Blood:100]   LOCAL MEDICATIONS USED:  OTHER ropivicaine intraabdominal, aristas  SPECIMEN:  Source of Specimen:  uterus, cervix, tubes  DISPOSITION OF SPECIMEN:  PATHOLOGY  COUNTS:  YES  TOURNIQUET:  * No tourniquets in log *  DICTATION: .Note written in EPIC  PLAN OF CARE: Admit for overnight observation  PATIENT DISPOSITION:  PACU - hemodynamically stable.   Delay start of Pharmacological VTE agent (>24hrs) due to surgical blood loss or risk of bleeding: not applicable  Complications:  None.  Findings:  8 weeks size uterus.  R and L ovaries were normal, without endometriosis and left in place. There were filmy adhesions in the cul de sac that were attached the colon- these were removed with cautery.The ureters were identified during multiple points of the case and were always out of the field of dissection.  On cystoscopy, the bladder was intact and bilateral spill was seen from each ureteral orriface.    Technique:  After adequate anesthesia was achieved the patient was positioned, prepped and draped in usual sterile fashion.  A speculum was placed in the vagina and the cervix dilated with pratt dilators.  The  3 cm Koh ring  Advincula manipulator was placed in proper fashion.  The  Speculum was removed and the bladder catheterized with a foley.    Attention was turned to the abdomen where a 1 cm incision was made 1 cm above the umbilicus. The long 12 mm optiview trocar was  placed with direct visualization into the abdomen without complication and with the aid of two towel clips on either side of the umbilicus.  The other three trocar sites were marked out, all approximately 10 cm from each other and the camera.  Two 8.5 mm trocars were placed on either side of the camera port and a 5 mm airseal assistant port was placed 3 cm above the plane of the other trocars. All trocars were inserted under direct visualization of the camera.  The patient was placed in trendelenburg and then the Robot docked.  The PK forceps were placed on arm 2 and the Hot shears on arm 1 and introduced under direct visualization of the camera.  I then broke scrub and sat down at the console.  The above findings were noted and the ureters identified well out of the field of dissection.  The right fallopian tube was isolated and cauterized with the PK.  The Utero-ovarian ligament was then divided with the PK cautery and shears.  The posterior broad ligament was then divided with the hot shears until the uterosacral ligament.  The Broad and cardinal ligaments were then cauterized against the cervix to the level of the Koh ring, securing the uterine artery.  Each pedicle was then incised with the shears.  The anterior leaf was then incised at the reflection of the vessico-uterine junction and the lateral bladder retracted inferiorly after the round ligament had been divided with the PK forceps.  The left tube was  cauterized with the PK and divided with the shears;  then the left utero-ovarian ligament divided with the PK forceps and the scissors.  The round ligament was divided as well and the posterior leaf of the broad ligament then divided with the hot shears. The broad and cardinal ligaments were then cauterized on the left in the same way.   At the level of the internal os, the uterine arteries were bilaterally cauterized with the PK.  The ureters were identified well out of the field of dissection.  .   The  bladder was then able to be retracted inferiorly and the vesico-uterine fascia was incised in the midline until the bladder was removed one cm below the Koh ring.  The hot shears then circumferentially incised the vagina at the level of the reflection on the [facility] ring.  Once the uterus and cervix were amputated, cautery was used to insure hemostasis of the cuff.  Once hemostasis was achieved, the instruments were changed to the mega needle driver and mega suture cut needle driver and the cuff was closed with a running stitches of 0-vicryl V loc. Cautery was used to ensure hemostasis of the vaginal cuff very superficially.  The ureters were peristalsing bilaterally well and very lateral to the areas of operation.    The Robot was then undocked and I scrubbed back in.  The needle was removed, Arista placed over the raw areas and the cul de sac and Ropivicaine was introduced into the pelvis.  The fascia of the 12 mm trocar was closed with a figure of 8 stitch of 0 vicryl.  The skin incisions were closed with subcuticular stitches of 3-0 vicryl Rapide and Dermabond.  All instruments were removed from the vagina and cystoscopy performed, revealing an intact bladder and vigourous spill of urine from each ureteral orifice.  The cystoscope was removed and the patient taken to the recovery room in stable condition.  Javaya Oregon A

## 2016-08-06 ENCOUNTER — Encounter (HOSPITAL_COMMUNITY): Payer: Self-pay | Admitting: Obstetrics and Gynecology

## 2016-08-06 DIAGNOSIS — R1032 Left lower quadrant pain: Secondary | ICD-10-CM | POA: Diagnosis not present

## 2016-08-06 MED ORDER — OXYCODONE-ACETAMINOPHEN 5-325 MG PO TABS: 1.0000 | ORAL_TABLET | ORAL | 0 refills | Status: DC | PRN

## 2016-08-06 NOTE — Progress Notes (Signed)
Discharge teaching complete. Pt understood all information and did not have any questions. Pt discharged home to family. 

## 2016-08-06 NOTE — Discharge Summary (Signed)
Physician Discharge Summary  Patient ID: MARGREAT WIDENER MRN: [id number] DOB/AGE: [birth year] 51 y.o.  Admit date: 08/05/2016 Discharge date: 08/06/2016  Admission Diagnoses:R&LLQ pain not controlled with conservative, suspect endometreosis  Discharge Diagnoses: same Active Problems:   Postoperative state   Discharged Condition: good  Hospital Course: Uncomplicated Robotic TLH/Salpingectomies/cysto/lysis of adhesions  Consults: None  Significant Diagnostic Studies: labs: No results found for this or any previous visit (from the past 24 hour(s)).  Treatments: surgery:   Uncomplicated Robotic TLH/Salpingectomies/cysto/lysis of adhesions  Discharge Exam: Blood pressure (!) 108/57, pulse 69, temperature 98.2 F (36.8 C), temperature source Oral, resp. rate 18, height 5' 5.5" (1.664 m), weight (!) 309 lb (140.2 kg), SpO2 95 %.   Disposition: 01-Home or Self Care  Discharge Instructions    Call MD for:  temperature >100.4    Complete by:  As directed    Diet - low sodium heart healthy    Complete by:  As directed    Discharge instructions    Complete by:  As directed    No driving on narcotics, no sexual activity for 2 weeks.   Increase activity slowly    Complete by:  As directed    May shower / Bathe    Complete by:  As directed    Shower, no bath for 2 weeks.   Remove dressing in 24 hours    Complete by:  As directed    Sexual Activity Restrictions    Complete by:  As directed    No sexual activity for 2 weeks.     Allergies as of 08/06/2016      Reactions   Doxepin Other (See Comments)   sweats   Ibuprofen Other (See Comments)   Perforated ulcer   Morphine Hives   Nsaids Other (See Comments)   H/o ulcer   Trazodone Other (See Comments)   Didn't work for sleep Causes headaches      Medication List    TAKE these medications   acetaminophen 500 MG tablet Commonly known as:  TYLENOL Take 1,000 mg by mouth every 6 (six) hours as needed for mild pain.    ALPRAZolam 1 MG tablet Commonly known as:  XANAX Take 1 tablet by mouth daily as needed for anxiety. As needed for anxiety   amitriptyline 25 MG tablet Commonly known as:  ELAVIL Take 25 mg by mouth at bedtime as needed for sleep.   buPROPion 75 MG tablet Commonly known as:  WELLBUTRIN Take 75 mg by mouth daily.   Cetirizine HCl 10 MG Caps Take 1 capsule by mouth daily as needed (allergies). allergies   docusate sodium 100 MG capsule Commonly known as:  COLACE Take 1 tablet once or twice daily as needed for constipation while taking narcotic pain medicine   fluvoxaMINE 100 MG tablet Commonly known as:  LUVOX Take 100 mg by mouth 2 (two) times daily.   HYDROcodone-acetaminophen 5-325 MG tablet Commonly known as:  NORCO Take 1 tablet by mouth every 8 (eight) hours as needed for moderate pain.   ibuprofen 800 MG tablet Commonly known as:  ADVIL,MOTRIN Take 800 mg by mouth every 8 (eight) hours as needed for moderate pain.   lamoTRIgine 200 MG tablet Commonly known as:  LAMICTAL Take 200 mg by mouth 2 (two) times daily.   oxyCODONE-acetaminophen 5-325 MG tablet Commonly known as:  PERCOCET/ROXICET Take 1-2 tablets by mouth every 4 (four) hours as needed for severe pain (moderate to severe pain (when tolerating fluids)).   [place]  5000 SENSITIVE 1.1-5 % Pste Generic drug:  Sod Fluoride-Potassium Nitrate use twice daily   PROAIR HFA 108 (90 Base) MCG/ACT inhaler Generic drug:  albuterol inhale 2 puffs by mouth every 4 hours if needed for wheezing   traMADol 50 MG tablet Commonly known as:  ULTRAM Take 50 mg by mouth every 6 (six) hours as needed for moderate pain.   valACYclovir 1000 MG tablet Commonly known as:  VALTREX 2 tablets on 1st day and 1 tablet on 2nd day as needed for cold sores      Follow-up Information    Bobbye Charleston, MD Follow up in 2 week(s).   Specialty:  Obstetrics and Gynecology Contact information: [address]  [place] [address] [phone]           Signed: Markavious Micco A 08/06/2016, 7:37 AM

## 2016-08-06 NOTE — Anesthesia Postprocedure Evaluation (Addendum)
Anesthesia Post Note  Patient: Tracy [name]  Procedure(s) Performed: Procedure(s) (LRB): ROBOTIC ASSISTED TOTAL HYSTERECTOMY WITH SALPINGECTOMY (Bilateral) CYSTOSCOPY (N/A) LYSIS OF ADHESION (N/A)  Patient location during evaluation: PACU Anesthesia Type: General Level of consciousness: awake and alert Pain management: pain level controlled Vital Signs Assessment: post-procedure vital signs reviewed and stable Respiratory status: spontaneous breathing, nonlabored ventilation, respiratory function stable and patient connected to nasal cannula oxygen Cardiovascular status: blood pressure returned to baseline and stable Postop Assessment: no signs of nausea or vomiting Anesthetic complications: no       Last Vitals:  Vitals:   08/06/16 0715 08/06/16 1130  BP: (!) 108/57 127/72  Pulse: 69 63  Resp: 18 18  Temp: 36.8 C 36.8 C    Last Pain:  Vitals:   08/06/16 1150  TempSrc:   PainSc: 2                  Infiniti Hoefling

## 2016-08-06 NOTE — Progress Notes (Signed)
Patient is eating, ambulating, and voiding.  Pain control is good.  BP (!) 108/57 (BP Location: Left Arm)   Pulse 69   Temp 98.2 F (36.8 C) (Oral)   Resp 18   Ht 5' 5.5" (1.664 m)   Wt (!) 309 lb (140.2 kg)   SpO2 95%   BMI 50.64 kg/m   lungs:   clear to auscultation cor:    RRR Abdomen:  soft, appropriate tenderness, incisions intact and without erythema or exudate. ex:    no cords   Lab Results  Component Value Date   WBC 5.7 07/28/2016   HGB 11.3 (L) 07/28/2016   HCT 36.4 07/28/2016   MCV 81.6 07/28/2016   PLT 326 07/28/2016    A/P  Routine care.  Expect d/c per plan.

## 2016-08-17 NOTE — Addendum Note (Signed)
Addendum  created 08/17/16 1559 by Oleta Mouse, MD   Sign clinical note

## 2016-08-17 NOTE — Addendum Note (Signed)
Addendum  created 08/17/16 1556 by Oleta Mouse, MD   Sign clinical note

## 2016-11-09 ENCOUNTER — Other Ambulatory Visit: Payer: Self-pay | Admitting: Obstetrics and Gynecology

## 2016-11-09 DIAGNOSIS — R102 Pelvic and perineal pain: Secondary | ICD-10-CM

## 2016-11-11 ENCOUNTER — Other Ambulatory Visit: Payer: BLUE CROSS/BLUE SHIELD

## 2016-11-12 ENCOUNTER — Other Ambulatory Visit: Payer: BLUE CROSS/BLUE SHIELD

## 2016-11-17 ENCOUNTER — Other Ambulatory Visit: Payer: BLUE CROSS/BLUE SHIELD

## 2016-12-01 ENCOUNTER — Ambulatory Visit
Admission: RE | Admit: 2016-12-01 | Discharge: 2016-12-01 | Disposition: A | Discharge status: Home / Self Care | Payer: BLUE CROSS/BLUE SHIELD | Source: Ambulatory Visit | Attending: Obstetrics and Gynecology | Admitting: Obstetrics and Gynecology

## 2016-12-01 DIAGNOSIS — R102 Pelvic and perineal pain: Secondary | ICD-10-CM

## 2016-12-01 MED ORDER — IOPAMIDOL (ISOVUE-300) INJECTION 61%
100.0000 mL | Freq: Once | INTRAVENOUS | Status: AC | PRN
  Administered 2016-12-01: 100 mL via INTRAVENOUS

## 2017-04-08 ENCOUNTER — Other Ambulatory Visit: Payer: Self-pay | Admitting: Obstetrics and Gynecology

## 2017-05-05 NOTE — Patient Instructions (Addendum)
Tracy [name]  05/05/2017   Your procedure is scheduled on: 05-12-17   Report to [facility] Main  Entrance Report to Admitting at 9:30 AM   Call this number if you have problems the morning of surgery [phone]   Remember: Do not eat food or drink liquids :After Midnight.     Take these medicines the morning of surgery with A SIP OF WATER: Alprazolam (Xana) only-per pt's preference                                You may not have any metal on your body including hair pins and              piercings  Do not wear jewelry, make-up, lotions, powders or perfumes, deodorant             Do not wear nail polish.  Do not shave  48 hours prior to surgery.                Do not bring valuables to the hospital. [place].  Contacts, dentures or bridgework may not be worn into surgery.      Patients discharged the day of surgery will not be allowed to drive home.  Name and phone number of your driver:James Cory Munch [phone]                Please read over the following fact sheets you were given: _____________________________________________________________________             [facility] - Preparing for Surgery Before surgery, you can play an important role.  Because skin is not sterile, your skin needs to be as free of germs as possible.  You can reduce the number of germs on your skin by washing with CHG (chlorahexidine gluconate) soap before surgery.  CHG is an antiseptic cleaner which kills germs and bonds with the skin to continue killing germs even after washing. Please DO NOT use if you have an allergy to CHG or antibacterial soaps.  If your skin becomes reddened/irritated stop using the CHG and inform your nurse when you arrive at Short Stay. Do not shave (including legs and underarms) for at least 48 hours prior to the first CHG shower.  You may shave your face/neck. Please follow these instructions  carefully:  1.  Shower with CHG Soap the night before surgery and the  morning of Surgery.  2.  If you choose to wash your hair, wash your hair first as usual with your  normal  shampoo.  3.  After you shampoo, rinse your hair and body thoroughly to remove the  shampoo.                           4.  Use CHG as you would any other liquid soap.  You can apply chg directly  to the skin and wash                       Gently with a scrungie or clean washcloth.  5.  Apply the CHG Soap to your body ONLY FROM THE NECK DOWN.   Do not use on face/ open  Wound or open sores. Avoid contact with eyes, ears mouth and genitals (private parts).                       Wash face,  Genitals (private parts) with your normal soap.             6.  Wash thoroughly, paying special attention to the area where your surgery  will be performed.  7.  Thoroughly rinse your body with warm water from the neck down.  8.  DO NOT shower/wash with your normal soap after using and rinsing off  the CHG Soap.                9.  Pat yourself dry with a clean towel.            10.  Wear clean pajamas.            11.  Place clean sheets on your bed the night of your first shower and do not  sleep with pets. Day of Surgery : Do not apply any lotions/deodorants the morning of surgery.  Please wear clean clothes to the hospital/surgery center.  FAILURE TO FOLLOW THESE INSTRUCTIONS MAY RESULT IN THE CANCELLATION OF YOUR SURGERY PATIENT SIGNATURE_________________________________  NURSE SIGNATURE__________________________________  ________________________________________________________________________   Adam Phenix  An incentive spirometer is a tool that can help keep your lungs clear and active. This tool measures how well you are filling your lungs with each breath. Taking long deep breaths may help reverse or decrease the chance of developing breathing (pulmonary) problems (especially infection)  following:  A long period of time when you are unable to move or be active. BEFORE THE PROCEDURE   If the spirometer includes an indicator to show your best effort, your nurse or respiratory therapist will set it to a desired goal.  If possible, sit up straight or lean slightly forward. Try not to slouch.  Hold the incentive spirometer in an upright position. INSTRUCTIONS FOR USE  1. Sit on the edge of your bed if possible, or sit up as far as you can in bed or on a chair. 2. Hold the incentive spirometer in an upright position. 3. Breathe out normally. 4. Place the mouthpiece in your mouth and seal your lips tightly around it. 5. Breathe in slowly and as deeply as possible, raising the piston or the ball toward the top of the column. 6. Hold your breath for 3-5 seconds or for as long as possible. Allow the piston or ball to fall to the bottom of the column. 7. Remove the mouthpiece from your mouth and breathe out normally. 8. Rest for a few seconds and repeat Steps 1 through 7 at least 10 times every 1-2 hours when you are awake. Take your time and take a few normal breaths between deep breaths. 9. The spirometer may include an indicator to show your best effort. Use the indicator as a goal to work toward during each repetition. 10. After each set of 10 deep breaths, practice coughing to be sure your lungs are clear. If you have an incision (the cut made at the time of surgery), support your incision when coughing by placing a pillow or rolled up towels firmly against it. Once you are able to get out of bed, walk around indoors and cough well. You may stop using the incentive spirometer when instructed by your caregiver.  RISKS AND COMPLICATIONS  Take your time so you do not get  dizzy or light-headed.  If you are in pain, you may need to take or ask for pain medication before doing incentive spirometry. It is harder to take a deep breath if you are having pain. AFTER USE  Rest and  breathe slowly and easily.  It can be helpful to keep track of a log of your progress. Your caregiver can provide you with a simple table to help with this. If you are using the spirometer at home, follow these instructions: [place] IF:   You are having difficultly using the spirometer.  You have trouble using the spirometer as often as instructed.  Your pain medication is not giving enough relief while using the spirometer.  You develop fever of 100.5 F (38.1 C) or higher. SEEK IMMEDIATE MEDICAL CARE IF:   You cough up bloody sputum that had not been present before.  You develop fever of 102 F (38.9 C) or greater.  You develop worsening pain at or near the incision site. MAKE SURE YOU:   Understand these instructions.  Will watch your condition.  Will get help right away if you are not doing well or get worse. Document Released: 07/13/2006 Document Revised: 05/25/2011 Document Reviewed: 09/13/2006 [facility] Patient Information 2014 [place], [place].   ________________________________________________________________________

## 2017-05-06 ENCOUNTER — Encounter (HOSPITAL_COMMUNITY)
Admission: RE | Admit: 2017-05-06 | Discharge: 2017-05-06 | Disposition: A | Discharge status: Home / Self Care | Payer: BLUE CROSS/BLUE SHIELD | Source: Ambulatory Visit | Attending: Obstetrics and Gynecology | Admitting: Obstetrics and Gynecology

## 2017-05-06 ENCOUNTER — Other Ambulatory Visit: Payer: Self-pay

## 2017-05-06 ENCOUNTER — Encounter (HOSPITAL_COMMUNITY): Payer: Self-pay

## 2017-05-06 DIAGNOSIS — N83201 Unspecified ovarian cyst, right side: Secondary | ICD-10-CM | POA: Insufficient documentation

## 2017-05-06 DIAGNOSIS — R9431 Abnormal electrocardiogram [ECG] [EKG]: Secondary | ICD-10-CM | POA: Diagnosis not present

## 2017-05-06 DIAGNOSIS — Z01818 Encounter for other preprocedural examination: Secondary | ICD-10-CM | POA: Diagnosis not present

## 2017-05-06 DIAGNOSIS — I498 Other specified cardiac arrhythmias: Secondary | ICD-10-CM | POA: Diagnosis not present

## 2017-05-06 LAB — BASIC METABOLIC PANEL
Anion gap: 11 (ref 5–15)
BUN: 12 mg/dL (ref 6–20)
CO2: 29 mmol/L (ref 22–32)
Calcium: 9.1 mg/dL (ref 8.9–10.3)
Chloride: 99 mmol/L — ABNORMAL LOW (ref 101–111)
Creatinine, Ser: 0.7 mg/dL (ref 0.44–1.00)
GFR calc Af Amer: >60 mL/min (ref >60)
GFR calc non Af Amer: >60 mL/min (ref >60)
Glucose, Bld: 96 mg/dL (ref 65–99)
Potassium: 3.9 mmol/L (ref 3.5–5.1)
Sodium: 139 mmol/L (ref 135–145)

## 2017-05-06 LAB — CBC
HCT: 35 % — ABNORMAL LOW (ref 36.0–46.0)
Hemoglobin: 10.4 g/dL — ABNORMAL LOW (ref 12.0–15.0)
MCH: 22.2 pg — ABNORMAL LOW (ref 26.0–34.0)
MCHC: 29.7 g/dL — ABNORMAL LOW (ref 30.0–36.0)
MCV: 74.8 fL — ABNORMAL LOW (ref 78.0–100.0)
Platelets: 343 10*3/uL (ref 150–400)
RBC: 4.68 MIL/uL (ref 3.87–5.11)
RDW: 17.9 % — ABNORMAL HIGH (ref 11.5–15.5)
WBC: 8.2 10*3/uL (ref 4.0–10.5)

## 2017-05-10 NOTE — H&P (Signed)
52 y.o. yo complains of continued dyspareunia on R despite Robo TLH almost a year ago.  Pt presented for pain post endometrial ablation last year and was c/w endometriosis.  She underwent an uncomplicated TLH and no lesions or abnormalities were seen on either ovary. There were adhesions of the cul de sac that were removed.  Pt recovered well but continued to have pain on R side and dyspareunia as well.    Previously:"Pt. here for recheck on pelvic pain, had [place] last visit; s/p TLH/bilateral salpingectomies on 08/05/16 (10 weeks out); Pt. c/o same intermittent pelvic pain cramping and pressure, also having burning with urination and increased frequency since surgery, denies any bleeding/ tb//\US about 11 days ago showed 6 cm complex cyst ovary with some free fluid. Did not look like abscess. No bigger than last visit. "  Then:"US just before surgery had no ovarian cysts - previous had resolved.\Today RO with two mildly complex cystic masses without increase in blood flow. Cysts are 3.9 and 3.7 with cyst wall echogenicity and inferior has fine internal echos. Small amount of ff. LO not seen today."  Ibuprofen, Micronor and time have not improved pain.  [place] in October showed still septated R ovary about 5 cm with small amount of ff.  Since all other options exhausted, she desires reoperation for RO possible LO if both look abnormal.    Past Medical History:  Diagnosis Date  . Anemia   . Anxiety   . Anxiety and depression   . Arthritis    left knee  . Asthma    hardly uses inhaler  . Depression   . Diverticulosis   . Headache    Migraines  . Heart murmur   . Hypertension    history no longer requires medication management  . IDA (iron deficiency anemia)   . Melasma   . Perforated ulcer ([place])   . PUD (peptic ulcer disease)   . Sleep apnea    mild, no CPAP ordered   Past Surgical History:  Procedure Laterality Date  . CHOLECYSTECTOMY    . COLONOSCOPY    . CYSTOSCOPY N/A 08/05/2016   Procedure: CYSTOSCOPY;  Surgeon: Bobbye Charleston, MD;  Location: [facility];  Service: Gynecology;  Laterality: N/A;  . ENDOMETRIAL ABLATION W/ NOVASURE    . GASTRIC BYPASS OPEN    . LYSIS OF ADHESION N/A 08/05/2016   Procedure: LYSIS OF ADHESION;  Surgeon: Bobbye Charleston, MD;  Location: [facility];  Service: Gynecology;  Laterality: N/A;  . perforated ulcer surgery    . ROBOTIC ASSISTED TOTAL HYSTERECTOMY WITH SALPINGECTOMY Bilateral 08/05/2016   Procedure: ROBOTIC ASSISTED TOTAL HYSTERECTOMY WITH SALPINGECTOMY;  Surgeon: Bobbye Charleston, MD;  Location: [facility];  Service: Gynecology;  Laterality: Bilateral;  . TONSILLECTOMY    . UPPER GI ENDOSCOPY      Social History   Socioeconomic History  . Marital status: Married    Spouse name: Not on file  . Number of children: Not on file  . Years of education: Not on file  . Highest education level: Not on file  Social Needs  . Financial resource strain: Not on file  . Food insecurity - worry: Not on file  . Food insecurity - inability: Not on file  . Transportation needs - medical: Not on file  . Transportation needs - non-medical: Not on file  Occupational History  . Not on file  Tobacco Use  . Smoking status: Never Smoker  . Smokeless tobacco: Never Used  Substance and Sexual  Activity  . Alcohol use: Yes    Alcohol/week: 0.0 oz    Comment: rare  . Drug use: No  . Sexual activity: No  Other Topics Concern  . Not on file  Social History Narrative  . Not on file    No current facility-administered medications on file prior to encounter.    Current Outpatient Medications on File Prior to Encounter  Medication Sig Dispense Refill  . acetaminophen (TYLENOL) 500 MG tablet Take 1,000 mg by mouth every 6 (six) hours as needed for mild pain.    [name] albuterol (PROAIR HFA) 108 (90 Base) MCG/ACT inhaler inhale 2 puffs by mouth every 4 hours if needed for wheezing    . ALPRAZolam (XANAX) 1 MG tablet Take 1 tablet by mouth daily as needed for  anxiety.   0  . Biotin 10000 MCG TABS Take 10,000 mcg by mouth daily.    [name] buPROPion (WELLBUTRIN XL) 300 MG 24 hr tablet Take 300 mg by mouth daily.    . Cetirizine HCl 10 MG CAPS Take 10 mg by mouth daily as needed (allergies). allergies    . fluvoxaMINE (LUVOX) 100 MG tablet Take 300 mg by mouth daily.     [name] ibuprofen (ADVIL,MOTRIN) 800 MG tablet Take 800 mg by mouth daily as needed for moderate pain.     [name] lamoTRIgine (LAMICTAL) 200 MG tablet Take 400 mg by mouth daily.     . Sod Fluoride-Potassium Nitrate (PREVIDENT 5000 SENSITIVE) 3.3-5 % PSTE 1 application to teeth twice daily    . valACYclovir (VALTREX) 1000 MG tablet Take 1000 mg by mouth once daily as needed for cold sores    . Vitamin D, Ergocalciferol, (DRISDOL) 50000 units CAPS capsule Take 50,000 Units by mouth every Monday.      Allergies  Allergen Reactions  . Doxepin Other (See Comments)    sweats  . Morphine Hives  . Nsaids Other (See Comments)    H/o ulcer  . Trazodone Other (See Comments)    Didn't work for sleep Causes headaches    There were no vitals filed for this visit.  Lungs: clear to ascultation Cor:  RRR Abdomen:  soft, nontender, nondistended. Ex:  no cords, erythema Pelvic: cuff intact, no masses felt, R sided tenderness  A:  For Robo RSO, possible LSO, lysis of adhesions, possible cautery of endometriosis.  Pt had previous large abdominal scar but last surgery was clear of adhesions anteriorly.   P: All risks, benefits and alternatives d/w patient and she desires to proceed.  Patient has undergone a modified diet and will receive preop antibiotics and SCDs during the operation.     Shawanda Sievert A

## 2017-05-11 MED ORDER — DEXTROSE 5 % IV SOLN
3.0000 g | INTRAVENOUS | Status: AC
  Administered 2017-05-12: 3 g via INTRAVENOUS
  Filled 2017-05-11: qty 3

## 2017-05-12 ENCOUNTER — Ambulatory Visit (HOSPITAL_COMMUNITY): Payer: BLUE CROSS/BLUE SHIELD

## 2017-05-12 ENCOUNTER — Ambulatory Visit (HOSPITAL_COMMUNITY): Payer: BLUE CROSS/BLUE SHIELD | Admitting: Anesthesiology

## 2017-05-12 ENCOUNTER — Ambulatory Visit (HOSPITAL_COMMUNITY)
Admission: RE | Admit: 2017-05-12 | Discharge: 2017-05-12 | Disposition: A | Discharge status: Home / Self Care | Payer: BLUE CROSS/BLUE SHIELD | Source: Ambulatory Visit | Attending: Obstetrics and Gynecology | Admitting: Obstetrics and Gynecology

## 2017-05-12 ENCOUNTER — Encounter (HOSPITAL_COMMUNITY)
Admission: RE | Disposition: A | Discharge status: Home / Self Care | Payer: Self-pay | Source: Ambulatory Visit | Attending: Obstetrics and Gynecology

## 2017-05-12 ENCOUNTER — Other Ambulatory Visit: Payer: Self-pay

## 2017-05-12 ENCOUNTER — Encounter (HOSPITAL_COMMUNITY): Payer: Self-pay | Admitting: *Deleted

## 2017-05-12 DIAGNOSIS — Z9884 Bariatric surgery status: Secondary | ICD-10-CM | POA: Diagnosis not present

## 2017-05-12 DIAGNOSIS — N83201 Unspecified ovarian cyst, right side: Secondary | ICD-10-CM | POA: Diagnosis not present

## 2017-05-12 DIAGNOSIS — Z886 Allergy status to analgesic agent status: Secondary | ICD-10-CM | POA: Insufficient documentation

## 2017-05-12 DIAGNOSIS — Z6841 Body Mass Index (BMI) 40.0 and over, adult: Secondary | ICD-10-CM | POA: Diagnosis not present

## 2017-05-12 DIAGNOSIS — G4730 Sleep apnea, unspecified: Secondary | ICD-10-CM | POA: Insufficient documentation

## 2017-05-12 DIAGNOSIS — F419 Anxiety disorder, unspecified: Secondary | ICD-10-CM | POA: Diagnosis not present

## 2017-05-12 DIAGNOSIS — Z885 Allergy status to narcotic agent status: Secondary | ICD-10-CM | POA: Diagnosis not present

## 2017-05-12 DIAGNOSIS — F329 Major depressive disorder, single episode, unspecified: Secondary | ICD-10-CM | POA: Diagnosis not present

## 2017-05-12 DIAGNOSIS — Z8711 Personal history of peptic ulcer disease: Secondary | ICD-10-CM | POA: Insufficient documentation

## 2017-05-12 DIAGNOSIS — I10 Essential (primary) hypertension: Secondary | ICD-10-CM | POA: Insufficient documentation

## 2017-05-12 DIAGNOSIS — N9489 Other specified conditions associated with female genital organs and menstrual cycle: Secondary | ICD-10-CM | POA: Insufficient documentation

## 2017-05-12 DIAGNOSIS — N803 Endometriosis of pelvic peritoneum: Secondary | ICD-10-CM | POA: Insufficient documentation

## 2017-05-12 DIAGNOSIS — R0602 Shortness of breath: Secondary | ICD-10-CM

## 2017-05-12 DIAGNOSIS — D509 Iron deficiency anemia, unspecified: Secondary | ICD-10-CM | POA: Insufficient documentation

## 2017-05-12 DIAGNOSIS — J45909 Unspecified asthma, uncomplicated: Secondary | ICD-10-CM | POA: Insufficient documentation

## 2017-05-12 DIAGNOSIS — Z79899 Other long term (current) drug therapy: Secondary | ICD-10-CM | POA: Diagnosis not present

## 2017-05-12 HISTORY — PX: ROBOTIC ASSISTED SALPINGO OOPHERECTOMY: SHX6082

## 2017-05-12 SURGERY — SALPINGO-OOPHORECTOMY, ROBOT-ASSISTED
Anesthesia: General | Site: Abdomen | Laterality: Right

## 2017-05-12 MED ORDER — LIDOCAINE 2% (20 MG/ML) 5 ML SYRINGE
INTRAMUSCULAR | Status: AC
  Filled 2017-05-12: qty 10

## 2017-05-12 MED ORDER — OXYCODONE-ACETAMINOPHEN 5-325 MG PO TABS
1.0000 | ORAL_TABLET | Freq: Four times a day (QID) | ORAL | Status: DC | PRN
  Administered 2017-05-12: 1 via ORAL

## 2017-05-12 MED ORDER — LIDOCAINE HCL (CARDIAC) 20 MG/ML IV SOLN
INTRAVENOUS | Status: DC | PRN
  Administered 2017-05-12: 80 mg via INTRAVENOUS
  Administered 2017-05-12: 20 mg via INTRAVENOUS

## 2017-05-12 MED ORDER — SCOPOLAMINE 1 MG/3DAYS TD PT72: 1.0000 | MEDICATED_PATCH | TRANSDERMAL | Status: DC

## 2017-05-12 MED ORDER — PROPOFOL 10 MG/ML IV BOLUS
INTRAVENOUS | Status: DC | PRN
  Administered 2017-05-12: 50 mg via INTRAVENOUS
  Administered 2017-05-12: 200 mg via INTRAVENOUS

## 2017-05-12 MED ORDER — ONDANSETRON HCL 4 MG/2ML IJ SOLN
INTRAMUSCULAR | Status: AC
Start: 2017-05-12 — End: ?
  Filled 2017-05-12: qty 2

## 2017-05-12 MED ORDER — FENTANYL CITRATE (PF) 100 MCG/2ML IJ SOLN
25.0000 ug | INTRAMUSCULAR | Status: DC | PRN
  Administered 2017-05-12 (×3): 50 ug via INTRAVENOUS

## 2017-05-12 MED ORDER — MIDAZOLAM HCL 2 MG/2ML IJ SOLN
INTRAMUSCULAR | Status: AC
  Filled 2017-05-12: qty 2

## 2017-05-12 MED ORDER — SCOPOLAMINE 1 MG/3DAYS TD PT72
1.0000 | MEDICATED_PATCH | TRANSDERMAL | Status: DC
  Administered 2017-05-12: 1.5 mg via TRANSDERMAL

## 2017-05-12 MED ORDER — FENTANYL CITRATE (PF) 100 MCG/2ML IJ SOLN
INTRAMUSCULAR | Status: AC
  Filled 2017-05-12: qty 2

## 2017-05-12 MED ORDER — SODIUM CHLORIDE 0.9 % IV SOLN
INTRAVENOUS | Status: DC | PRN
  Administered 2017-05-12: 60 mL

## 2017-05-12 MED ORDER — DIPHENHYDRAMINE HCL 50 MG/ML IJ SOLN
INTRAMUSCULAR | Status: AC
  Filled 2017-05-12: qty 1

## 2017-05-12 MED ORDER — SCOPOLAMINE 1 MG/3DAYS TD PT72
MEDICATED_PATCH | TRANSDERMAL | Status: AC
  Filled 2017-05-12: qty 1

## 2017-05-12 MED ORDER — ROCURONIUM BROMIDE 10 MG/ML (PF) SYRINGE
PREFILLED_SYRINGE | INTRAVENOUS | Status: AC
  Filled 2017-05-12: qty 5

## 2017-05-12 MED ORDER — PROMETHAZINE HCL 25 MG/ML IJ SOLN: 6.2500 mg | INTRAMUSCULAR | Status: DC | PRN

## 2017-05-12 MED ORDER — OXYCODONE-ACETAMINOPHEN 5-325 MG PO TABS: 1.0000 | ORAL_TABLET | Freq: Four times a day (QID) | ORAL | 0 refills | Status: DC | PRN

## 2017-05-12 MED ORDER — OXYCODONE-ACETAMINOPHEN 5-325 MG PO TABS
ORAL_TABLET | ORAL | Status: AC
  Administered 2017-05-12: 1 via ORAL
  Filled 2017-05-12: qty 1

## 2017-05-12 MED ORDER — KETOROLAC TROMETHAMINE 30 MG/ML IJ SOLN
INTRAMUSCULAR | Status: DC | PRN
  Administered 2017-05-12: 30 mg via INTRAVENOUS

## 2017-05-12 MED ORDER — ROCURONIUM BROMIDE 100 MG/10ML IV SOLN
INTRAVENOUS | Status: DC | PRN
  Administered 2017-05-12: 10 mg via INTRAVENOUS
  Administered 2017-05-12: 50 mg via INTRAVENOUS

## 2017-05-12 MED ORDER — DEXAMETHASONE SODIUM PHOSPHATE 4 MG/ML IJ SOLN
INTRAMUSCULAR | Status: DC | PRN
  Administered 2017-05-12: 10 mg via INTRAVENOUS

## 2017-05-12 MED ORDER — SUGAMMADEX SODIUM 200 MG/2ML IV SOLN
INTRAVENOUS | Status: AC
  Filled 2017-05-12: qty 2

## 2017-05-12 MED ORDER — ONDANSETRON HCL 4 MG/2ML IJ SOLN
INTRAMUSCULAR | Status: DC | PRN
  Administered 2017-05-12: 4 mg via INTRAVENOUS

## 2017-05-12 MED ORDER — SUGAMMADEX SODIUM 200 MG/2ML IV SOLN
INTRAVENOUS | Status: DC | PRN
  Administered 2017-05-12: 120 mg via INTRAVENOUS
  Administered 2017-05-12: 280 mg via INTRAVENOUS

## 2017-05-12 MED ORDER — DEXAMETHASONE SODIUM PHOSPHATE 10 MG/ML IJ SOLN
INTRAMUSCULAR | Status: AC
  Filled 2017-05-12: qty 1

## 2017-05-12 MED ORDER — DIPHENHYDRAMINE HCL 50 MG/ML IJ SOLN
INTRAMUSCULAR | Status: DC | PRN
  Administered 2017-05-12: 12.5 mg via INTRAVENOUS

## 2017-05-12 MED ORDER — MIDAZOLAM HCL 5 MG/5ML IJ SOLN
INTRAMUSCULAR | Status: DC | PRN
  Administered 2017-05-12: 2 mg via INTRAVENOUS

## 2017-05-12 MED ORDER — PROPOFOL 10 MG/ML IV BOLUS
INTRAVENOUS | Status: AC
  Filled 2017-05-12: qty 40

## 2017-05-12 MED ORDER — SODIUM CHLORIDE 0.9 % IJ SOLN
INTRAMUSCULAR | Status: AC
  Filled 2017-05-12: qty 50

## 2017-05-12 MED ORDER — FENTANYL CITRATE (PF) 250 MCG/5ML IJ SOLN
INTRAMUSCULAR | Status: AC
  Filled 2017-05-12: qty 5

## 2017-05-12 MED ORDER — LACTATED RINGERS IV SOLN
INTRAVENOUS | Status: DC
  Administered 2017-05-12 (×2): via INTRAVENOUS

## 2017-05-12 MED ORDER — ROPIVACAINE HCL 5 MG/ML IJ SOLN
INTRAMUSCULAR | Status: AC
  Filled 2017-05-12: qty 30

## 2017-05-12 MED ORDER — FENTANYL CITRATE (PF) 100 MCG/2ML IJ SOLN
INTRAMUSCULAR | Status: DC | PRN
  Administered 2017-05-12 (×9): 50 ug via INTRAVENOUS

## 2017-05-12 SURGICAL SUPPLY — 63 items
ADH SKN CLS APL DERMABOND .7 (GAUZE/BANDAGES/DRESSINGS) ×1
APL SRG 38 LTWT LNG FL B (MISCELLANEOUS) ×1
APPLICATOR ARISTA FLEXITIP XL (MISCELLANEOUS) ×1 IMPLANT
BAG SPEC RTRVL LRG 6X4 10 (ENDOMECHANICALS) ×1
BARRIER ADHS 3X4 INTERCEED (GAUZE/BANDAGES/DRESSINGS) IMPLANT
BRR ADH 4X3 ABS CNTRL BYND (GAUZE/BANDAGES/DRESSINGS)
CANISTER SUCT 3000ML PPV (MISCELLANEOUS) ×2 IMPLANT
CATH FOLEY 2WAY SLVR  5CC 16FR (CATHETERS) ×1
CATH FOLEY 2WAY SLVR 5CC 16FR (CATHETERS) ×1 IMPLANT
COVER BACK TABLE 60X90IN (DRAPES) ×2 IMPLANT
COVER TIP SHEARS 8 DVNC (MISCELLANEOUS) ×1 IMPLANT
COVER TIP SHEARS 8MM DA VINCI (MISCELLANEOUS) ×1
DECANTER SPIKE VIAL GLASS SM (MISCELLANEOUS) ×4 IMPLANT
DEFOGGER SCOPE WARMER CLEARIFY (MISCELLANEOUS) ×2 IMPLANT
DERMABOND ADVANCED (GAUZE/BANDAGES/DRESSINGS) ×1
DERMABOND ADVANCED .7 DNX12 (GAUZE/BANDAGES/DRESSINGS) ×1 IMPLANT
DRAPE ARM DVNC X/XI (DISPOSABLE) ×3 IMPLANT
DRAPE COLUMN DVNC XI (DISPOSABLE) ×1 IMPLANT
DRAPE DA VINCI XI ARM (DISPOSABLE) ×3
DRAPE DA VINCI XI COLUMN (DISPOSABLE) ×1
DRAPE HIP W/POCKET STRL (DRAPE) ×1 IMPLANT
DURAPREP 26ML APPLICATOR (WOUND CARE) ×2 IMPLANT
ELECT REM PT RETURN 15FT ADLT (MISCELLANEOUS) ×2 IMPLANT
GLOVE BIO SURGEON STRL SZ7 (GLOVE) ×6 IMPLANT
GLOVE BIOGEL PI IND STRL 7.0 (GLOVE) ×2 IMPLANT
GLOVE BIOGEL PI INDICATOR 7.0 (GLOVE) ×2
HEMOSTAT ARISTA ABSORB 3G PWDR (MISCELLANEOUS) ×1 IMPLANT
IRRIG SUCT STRYKERFLOW 2 WTIP (MISCELLANEOUS)
IRRIGATION SUCT STRKRFLW 2 WTP (MISCELLANEOUS) ×1 IMPLANT
LEGGING LITHOTOMY PAIR STRL (DRAPES) ×2 IMPLANT
MANIPULATOR ADVINCU DEL 2.5 PL (MISCELLANEOUS) IMPLANT
MANIPULATOR ADVINCU DEL 3.0 PL (MISCELLANEOUS) IMPLANT
MANIPULATOR ADVINCU DEL 3.5 PL (MISCELLANEOUS) IMPLANT
MANIPULATOR ADVINCU DEL 4.0 PL (MISCELLANEOUS) IMPLANT
NEEDLE INSUFFLATION 120MM (ENDOMECHANICALS) ×2 IMPLANT
OBTURATOR OPTICAL STANDARD 8MM (TROCAR) ×1
OBTURATOR OPTICAL STND 8 DVNC (TROCAR) ×1
OBTURATOR OPTICALSTD 8 DVNC (TROCAR) ×1 IMPLANT
PACK ROBOT WH (CUSTOM PROCEDURE TRAY) ×2 IMPLANT
PACK ROBOTIC GOWN (GOWN DISPOSABLE) ×2 IMPLANT
PACK TRENDGUARD 450 HYBRID PRO (MISCELLANEOUS) IMPLANT
PACK TRENDGUARD 600 HYBRD PROC (MISCELLANEOUS) IMPLANT
PAD PREP 24X48 CUFFED NSTRL (MISCELLANEOUS) ×2 IMPLANT
POUCH LAPAROSCOPIC INSTRUMENT (MISCELLANEOUS) ×1 IMPLANT
POUCH SPECIMEN RETRIEVAL 10MM (ENDOMECHANICALS) ×1 IMPLANT
PROTECTOR NERVE ULNAR (MISCELLANEOUS) ×6 IMPLANT
SEAL CANN UNIV 5-8 DVNC XI (MISCELLANEOUS) ×3 IMPLANT
SEAL XI 5MM-8MM UNIVERSAL (MISCELLANEOUS) ×3
SET CYSTO W/LG BORE CLAMP LF (SET/KITS/TRAYS/PACK) ×1 IMPLANT
SET TRI-LUMEN FLTR TB AIRSEAL (TUBING) ×2 IMPLANT
SUT DVC VLOC 180 0 12IN GS21 (SUTURE)
SUT VIC AB 2-0 CT2 27 (SUTURE) ×4 IMPLANT
SUT VIC AB 2-0 UR6 27 (SUTURE) ×2 IMPLANT
SUT VICRYL RAPIDE 3 0 (SUTURE) ×4 IMPLANT
SUT VLOC 180 0 9IN  GS21 (SUTURE)
SUT VLOC 180 0 9IN GS21 (SUTURE) ×1 IMPLANT
SUTURE DVC VLC 180 0 12IN GS21 (SUTURE) IMPLANT
TOWEL OR 17X24 6PK STRL BLUE (TOWEL DISPOSABLE) ×6 IMPLANT
TRENDGUARD 450 HYBRID PRO PACK (MISCELLANEOUS)
TRENDGUARD 600 HYBRID PROC PK (MISCELLANEOUS) ×2
TROCAR PORT AIRSEAL 5X120 (TROCAR) ×2 IMPLANT
TROCAR XCEL NON-BLD 11X100MML (ENDOMECHANICALS) ×1 IMPLANT
WATER STERILE IRR 1000ML POUR (IV SOLUTION) ×2 IMPLANT

## 2017-05-12 NOTE — Anesthesia Procedure Notes (Signed)
Procedure Name: Intubation Date/Time: 05/12/2017 11:12 AM Performed by: Justice Rocher, CRNA Pre-anesthesia Checklist: Patient identified, Emergency Drugs available, Suction available and Patient being monitored Patient Re-evaluated:Patient Re-evaluated prior to induction Oxygen Delivery Method: Circle system utilized Preoxygenation: Pre-oxygenation with 100% oxygen Induction Type: IV induction Ventilation: Mask ventilation without difficulty Laryngoscope Size: Mac and 4 Grade View: Grade II Tube type: Oral Tube size: 7.5 mm Number of attempts: 1 Airway Equipment and Method: Stylet and Oral airway Placement Confirmation: ETT inserted through vocal cords under direct vision,  positive ETCO2 and breath sounds checked- equal and bilateral Secured at: 23 cm Tube secured with: Tape Dental Injury: Teeth and Oropharynx as per pre-operative assessment  Comments: Soft gauze oral airway in place orally

## 2017-05-12 NOTE — Transfer of Care (Signed)
Immediate Anesthesia Transfer of Care Note  Patient: Tracy [name]  Procedure(s) Performed: Procedure(s) (LRB): XI ROBOTIC ASSISTED RIGHT OOPHORECTOMY, Fulgeration of Endometriosis, Peritoneal Biopsy (Right)  Patient Location: PACU  Anesthesia Type: General  Level of Consciousness: awake, sedated, patient cooperative and responds to stimulation  Airway & Oxygen Therapy: Patient Spontanous Breathing and Patient connected to face mask oxygen  Post-op Assessment: Report given to PACU RN, Post -op Vital signs reviewed and stable and Patient moving all extremities  Post vital signs: Reviewed and stable  Complications: No apparent anesthesia complications

## 2017-05-12 NOTE — Progress Notes (Signed)
PACU, Phase II  Pt revaluated by Dr Jillyn Hidden prior to discharge home, pt maintaining VS and O2 sat WDL. Will proceed to  Discharge

## 2017-05-12 NOTE — Anesthesia Postprocedure Evaluation (Signed)
Anesthesia Post Note  Patient: Tracy [name]  Procedure(s) Performed: XI ROBOTIC ASSISTED RIGHT OOPHORECTOMY, Fulgeration of Endometriosis, Peritoneal Biopsy (Right Abdomen)     Patient location during evaluation: PACU Anesthesia Type: General Level of consciousness: sedated Pain management: pain level controlled Vital Signs Assessment: post-procedure vital signs reviewed and stable Respiratory status: spontaneous breathing and respiratory function stable Cardiovascular status: stable Postop Assessment: no apparent nausea or vomiting Anesthetic complications: no    Last Vitals:  Vitals:   05/12/17 1600 05/12/17 1615  BP: 125/82 (!) 144/91  Pulse: 74 72  Resp:    Temp:    SpO2: 96% 94%    Last Pain:  Vitals:   05/12/17 1615  TempSrc:   PainSc: 7                  Shade Rivenbark DANIEL

## 2017-05-12 NOTE — Progress Notes (Signed)
Dr Jillyn Hidden at bedside. Aware pt with o2 sats of 82%.

## 2017-05-12 NOTE — Progress Notes (Signed)
Dr Philis Pique at bedside.

## 2017-05-12 NOTE — Progress Notes (Signed)
Dr Philis Pique at bedside. Order received to obtain EKG

## 2017-05-12 NOTE — Op Note (Signed)
05/12/2017  12:53 PM  PATIENT:  Tracy [name]  52 y.o. female  PRE-OPERATIVE DIAGNOSIS:  RIGHT OVARIAN CYST  POST-OPERATIVE DIAGNOSIS:  RIGHT OVARIAN CYST and endometriosis  PROCEDURE:  Procedure(s): XI ROBOTIC ASSISTED RIGHT OOPHORECTOMY, Fulgeration of Endometriosis, Peritoneal Biopsy (Right)  SURGEON:  Surgeon(s) and Role:    * Bobbye Charleston, MD - Primary    * Jerelyn Charles, MD - Assisting   ANESTHESIA:   general  EBL:  5 mL   LOCAL MEDICATIONS USED:  OTHER Ropivicaine  SPECIMEN:  Source of Specimen:  R ovary and peritoneal biopsies  DISPOSITION OF SPECIMEN:  PATHOLOGY  COUNTS:  YES  TOURNIQUET:  * No tourniquets in log *  DICTATION: .Note written in EPIC  PLAN OF CARE: Discharge to home after PACU  PATIENT DISPOSITION:  PACU - hemodynamically stable.   Delay start of Pharmacological VTE agent (>24hrs) due to surgical blood loss or risk of bleeding: not applicable  Complications:  None.  Findings:  6 cm R Ovary with adhesions to peritoneum and omentum.  Some adhesions of sigmoid to peritoneum.  Liver edge and cul de sac were otherwise normal.  The ureters were seen well out of all fields of dissection.  Meds: Interceed  Technique:  After adequate general anesthesia was achieved, the patient was prepped and draped in sterile fashion.  The foley was placed and the sponge stick placed in the vagina.  Attention was turned to the abdomen and a  2 cm incision was made below the umbilicus.  The veress needle passed into the abdomen without aspiration of bowel contents or blood.  The 8 mm trocar for the camera port was introduced after insuflatation and the above findings noted.  Two 8.5 mm trocars were introduced 10 cm either side of the camera port under direct visualization.  The bipolar forceps were introduced on arm 2 and the Hot Shears on arm 1.    The IP was isolated by tenting up the ovary from the peritoneum.  The ureter was below the field of dissection. The  IP was then cauterized in two places and incised with the scissors.  The R ovary was then removed with careful cautery on the peritoneum that was adhesed to it, careful to be well away from the structures underneath.  The ovary was then placed in the cul de sac.  Hemostasis was assured with the insufflation down.  The Robot was then undocked.  An area of powder burn endometriosis was cauterized on the anterior R abdominal wall.  There were two areas of endometriosis on either end of the cuff and each was carefully tented up and removed with sharp and blunt dissection.  Each piece was removed and sent to path.  Hemostasis was achieved with cautery.  The ureters again were far from the area of dissection.   The 8.5 mm umbilical trocar was changed for a 10 mm optiview and the endobag introduced through the trocar. The ovary was placed inside an endobag.  The bag was then brought through the umbilical incision and opened to the outside.  The ovary  was removed in pieces with a kochar.  Fluid, ovary were successfully removed from the abdomen without any intraperitoneal spill and sent to pathology.  Arista was placed on the raw surfaces.  Ropivicaine was placed in the peritoneum.     All instruments were removed and the abdomen desuflated.  The 10 mm trocar site fascia was closed with a figure of eight stitch of 2-Vicryl.  All skin incisions were closed with subcuticular stitches and Dermabond.  All instruments were withdrawn from the vagina.  Pt tolerated the procedure well and was returned to the recovery room in stable condition.    Emilya Justen A

## 2017-05-12 NOTE — Progress Notes (Signed)
Vitals:   05/12/17 0919 05/12/17 0941  BP: (!) 149/83   Pulse: 71   Resp: 16   Temp: 98.3 F (36.8 C)   TempSrc: Oral   SpO2: 100%   Weight:  (!) 311 lb (141.1 kg)  Height:  5' 4.5" (1.638 m)   Recent Results (from the past 2160 hour(s))  Basic metabolic panel     Status: Abnormal   Collection Time: 05/06/17 11:17 AM  Result Value Ref Range   Sodium 139 135 - 145 mmol/L   Potassium 3.9 3.5 - 5.1 mmol/L   Chloride 99 (L) 101 - 111 mmol/L   CO2 29 22 - 32 mmol/L   Glucose, Bld 96 65 - 99 mg/dL   BUN 12 6 - 20 mg/dL   Creatinine, Ser 0.70 0.44 - 1.00 mg/dL   Calcium 9.1 8.9 - 10.3 mg/dL   GFR calc non Af Amer >60 >60 mL/min   GFR calc Af Amer >60 >60 mL/min    Comment: (NOTE) The eGFR has been calculated using the CKD EPI equation. This calculation has not been validated in all clinical situations. eGFR's persistently <60 mL/min signify possible Chronic Kidney Disease.    Anion gap 11 5 - 15    Comment: Performed at [facility], [place] [address] [place], [place] [address]  CBC     Status: Abnormal   Collection Time: 05/06/17 11:17 AM  Result Value Ref Range   WBC 8.2 4.0 - 10.5 K/uL   RBC 4.68 3.87 - 5.11 MIL/uL   Hemoglobin 10.4 (L) 12.0 - 15.0 g/dL   HCT 35.0 (L) 36.0 - 46.0 %   MCV 74.8 (L) 78.0 - 100.0 fL   MCH 22.2 (L) 26.0 - 34.0 pg   MCHC 29.7 (L) 30.0 - 36.0 g/dL   RDW 17.9 (H) 11.5 - 15.5 %   Platelets 343 150 - 400 K/uL    Comment: Performed at [facility], [place] [address], [place], [place] [address]

## 2017-05-12 NOTE — Anesthesia Preprocedure Evaluation (Signed)
Anesthesia Evaluation  Patient identified by MRN, date of birth, ID band Patient awake    Reviewed: Allergy & Precautions, NPO status , Patient's Chart, lab work & pertinent test results  History of Anesthesia Complications Negative for: history of anesthetic complications  Airway Mallampati: II  TM Distance: >3 FB Neck ROM: Full    Dental  (+) Teeth Intact   Pulmonary neg shortness of breath, asthma , sleep apnea , neg recent URI,    breath sounds clear to auscultation       Cardiovascular hypertension,  Rhythm:Regular     Neuro/Psych  Headaches, PSYCHIATRIC DISORDERS Anxiety Depression    GI/Hepatic Neg liver ROS, PUD,   Endo/Other  Morbid obesity  Renal/GU negative Renal ROS     Musculoskeletal  (+) Arthritis ,   Abdominal   Peds  Hematology negative hematology ROS (+)   Anesthesia Other Findings   Reproductive/Obstetrics                             Anesthesia Physical  Anesthesia Plan  ASA: III  Anesthesia Plan: General   Post-op Pain Management:    Induction: Intravenous  PONV Risk Score and Plan: 4 or greater and Ondansetron, Dexamethasone, Scopolamine patch - Pre-op and Diphenhydramine  Airway Management Planned: Oral ETT  Additional Equipment: None  Intra-op Plan:   Post-operative Plan: Extubation in OR  Informed Consent: I have reviewed the patients History and Physical, chart, labs and discussed the procedure including the risks, benefits and alternatives for the proposed anesthesia with the patient or authorized representative who has indicated his/her understanding and acceptance.   Dental advisory given  Plan Discussed with: CRNA and Surgeon  Anesthesia Plan Comments:         Anesthesia Quick Evaluation

## 2017-05-12 NOTE — Progress Notes (Signed)
There has been no change in the patients history, status or exam since the history and physical.  There were no vitals filed for this visit.  No results found for this or any previous visit (from the past 72 hour(s)).  Tracy [name]

## 2017-05-12 NOTE — Brief Op Note (Signed)
05/12/2017  12:53 PM  PATIENT:  Tracy [name]  52 y.o. female  PRE-OPERATIVE DIAGNOSIS:  RIGHT OVARIAN CYST  POST-OPERATIVE DIAGNOSIS:  RIGHT OVARIAN CYST and endometriosis  PROCEDURE:  Procedure(s): XI ROBOTIC ASSISTED RIGHT OOPHORECTOMY, Fulgeration of Endometriosis, Peritoneal Biopsy (Right)  SURGEON:  Surgeon(s) and Role:    * Bobbye Charleston, MD - Primary    * Jerelyn Charles, MD - Assisting   ANESTHESIA:   general  EBL:  5 mL   LOCAL MEDICATIONS USED:  OTHER Ropivicaine  SPECIMEN:  Source of Specimen:  R ovary and peritoneal biopsies  DISPOSITION OF SPECIMEN:  PATHOLOGY  COUNTS:  YES  TOURNIQUET:  * No tourniquets in log *  DICTATION: .Note written in EPIC  PLAN OF CARE: Discharge to home after PACU  PATIENT DISPOSITION:  PACU - hemodynamically stable.   Delay start of Pharmacological VTE agent (>24hrs) due to surgical blood loss or risk of bleeding: not applicable  Complications:  None.  Findings:  6 cm R Ovary with adhesions to peritoneum and omentum.  Some adhesions of sigmoid to peritoneum.  Liver edge and cul de sac were otherwise normal.  The ureters were seen well out of all fields of dissection.  Meds: Interceed  Technique:  After adequate general anesthesia was achieved, the patient was prepped and draped in sterile fashion.  The foley was placed and the sponge stick placed in the vagina.  Attention was turned to the abdomen and a  2 cm incision was made below the umbilicus.  The veress needle passed into the abdomen without aspiration of bowel contents or blood.  The 8 mm trocar for the camera port was introduced after insuflatation and the above findings noted.  Two 8.5 mm trocars were introduced 10 cm either side of the camera port under direct visualization.  The bipolar forceps were introduced on arm 2 and the Hot Shears on arm 1.    The IP was isolated by tenting up the ovary from the peritoneum.  The ureter was below the field of dissection. The  IP was then cauterized in two places and incised with the scissors.  The R ovary was then removed with careful cautery on the peritoneum that was adhesed to it, careful to be well away from the structures underneath.  The ovary was then placed in the cul de sac.  Hemostasis was assured with the insufflation down.  The Robot was then undocked.  An area of powder burn endometriosis was cauterized on the anterior R abdominal wall.  There were two areas of endometriosis on either end of the cuff and each was carefully tented up and removed with sharp and blunt dissection.  Each piece was removed and sent to path.  Hemostasis was achieved with cautery.  The ureters again were far from the area of dissection.   The 8.5 mm umbilical trocar was changed for a 10 mm optiview and the endobag introduced through the trocar. The ovary was placed inside an endobag.  The bag was then brought through the umbilical incision and opened to the outside.  The ovary  was removed in pieces with a kochar.  Fluid, ovary were successfully removed from the abdomen without any intraperitoneal spill and sent to pathology.  Arista was placed on the raw surfaces.  Ropivicaine was placed in the peritoneum.     All instruments were removed and the abdomen desuflated.  The 10 mm trocar site fascia was closed with a figure of eight stitch of 2-Vicryl.  All skin incisions were closed with subcuticular stitches and Dermabond.  All instruments were withdrawn from the vagina.  Pt tolerated the procedure well and was returned to the recovery room in stable condition.    Stormey Wilborn A

## 2017-07-21 ENCOUNTER — Encounter (INDEPENDENT_AMBULATORY_CARE_PROVIDER_SITE_OTHER): Payer: Self-pay

## 2017-07-21 ENCOUNTER — Encounter (INDEPENDENT_AMBULATORY_CARE_PROVIDER_SITE_OTHER): Payer: Self-pay | Admitting: Vascular Surgery

## 2017-08-02 ENCOUNTER — Encounter (INDEPENDENT_AMBULATORY_CARE_PROVIDER_SITE_OTHER): Payer: Self-pay | Admitting: Vascular Surgery

## 2017-08-02 ENCOUNTER — Encounter (INDEPENDENT_AMBULATORY_CARE_PROVIDER_SITE_OTHER): Payer: Self-pay

## 2017-08-16 ENCOUNTER — Encounter (INDEPENDENT_AMBULATORY_CARE_PROVIDER_SITE_OTHER): Payer: Self-pay

## 2017-08-18 ENCOUNTER — Encounter (INDEPENDENT_AMBULATORY_CARE_PROVIDER_SITE_OTHER): Payer: BLUE CROSS/BLUE SHIELD

## 2017-08-18 ENCOUNTER — Encounter (INDEPENDENT_AMBULATORY_CARE_PROVIDER_SITE_OTHER): Payer: BLUE CROSS/BLUE SHIELD | Admitting: Vascular Surgery

## 2017-09-13 ENCOUNTER — Encounter: Payer: Self-pay | Admitting: Emergency Medicine

## 2017-09-13 ENCOUNTER — Emergency Department
Admission: EM | Admit: 2017-09-13 | Discharge: 2017-09-13 | Disposition: A | Discharge status: Home / Self Care | Payer: BLUE CROSS/BLUE SHIELD | Attending: Emergency Medicine | Admitting: Emergency Medicine

## 2017-09-13 DIAGNOSIS — J45909 Unspecified asthma, uncomplicated: Secondary | ICD-10-CM | POA: Diagnosis not present

## 2017-09-13 DIAGNOSIS — Y929 Unspecified place or not applicable: Secondary | ICD-10-CM | POA: Insufficient documentation

## 2017-09-13 DIAGNOSIS — S31112A Laceration without foreign body of abdominal wall, epigastric region without penetration into peritoneal cavity, initial encounter: Secondary | ICD-10-CM | POA: Insufficient documentation

## 2017-09-13 DIAGNOSIS — F329 Major depressive disorder, single episode, unspecified: Secondary | ICD-10-CM | POA: Insufficient documentation

## 2017-09-13 DIAGNOSIS — S31119A Laceration without foreign body of abdominal wall, unspecified quadrant without penetration into peritoneal cavity, initial encounter: Secondary | ICD-10-CM

## 2017-09-13 DIAGNOSIS — Z79899 Other long term (current) drug therapy: Secondary | ICD-10-CM | POA: Insufficient documentation

## 2017-09-13 DIAGNOSIS — Z9884 Bariatric surgery status: Secondary | ICD-10-CM | POA: Insufficient documentation

## 2017-09-13 DIAGNOSIS — Y998 Other external cause status: Secondary | ICD-10-CM | POA: Insufficient documentation

## 2017-09-13 DIAGNOSIS — W260XXA Contact with knife, initial encounter: Secondary | ICD-10-CM | POA: Diagnosis not present

## 2017-09-13 DIAGNOSIS — F419 Anxiety disorder, unspecified: Secondary | ICD-10-CM | POA: Insufficient documentation

## 2017-09-13 DIAGNOSIS — I10 Essential (primary) hypertension: Secondary | ICD-10-CM | POA: Insufficient documentation

## 2017-09-13 DIAGNOSIS — Y93G9 Activity, other involving cooking and grilling: Secondary | ICD-10-CM | POA: Insufficient documentation

## 2017-09-13 DIAGNOSIS — Z9049 Acquired absence of other specified parts of digestive tract: Secondary | ICD-10-CM | POA: Insufficient documentation

## 2017-09-13 MED ORDER — CEPHALEXIN 500 MG PO CAPS: 500.0000 mg | ORAL_CAPSULE | Freq: Three times a day (TID) | ORAL | 0 refills | Status: AC

## 2017-09-13 MED ORDER — LIDOCAINE HCL (PF) 1 % IJ SOLN
INTRAMUSCULAR | Status: AC
  Administered 2017-09-13: 5 mL
  Filled 2017-09-13: qty 5

## 2017-09-13 MED ORDER — LIDOCAINE HCL 1 % IJ SOLN
5.0000 mL | Freq: Once | INTRAMUSCULAR | Status: AC
  Administered 2017-09-13: 5 mL

## 2017-09-13 NOTE — ED Provider Notes (Signed)
[facility] Emergency Department Provider Note  ____________________________________________  Time seen: Approximately 10:40 PM  I have reviewed the triage vital signs and the nursing notes.   HISTORY  Chief Complaint Laceration    HPI Tracy [name] is a 52 y.o. female presents to the ED with a 4 cm abdominal laceration after patient reports that she was cutting cantaloupe when her knife slipped.  Patient reports that laceration was unintentional.  Her tetanus status is up-to-date.  No alleviating measures have been attempted.   Past Medical History:  Diagnosis Date  . Anemia   . Anxiety   . Anxiety and depression   . Arthritis    left knee  . Asthma    hardly uses inhaler  . Depression   . Diverticulosis   . Headache    Migraines  . Heart murmur   . Hypertension    history no longer requires medication management  . IDA (iron deficiency anemia)   . Melasma   . Perforated ulcer ([place])   . PUD (peptic ulcer disease)   . Sleep apnea    mild, no CPAP ordered    Patient Active Problem List   Diagnosis Date Noted  . Postoperative state 08/05/2016  . Anxiety and depression 01/29/2015  . Airway hyperreactivity 01/29/2015  . Gastroduodenal ulcer 01/29/2015  . Other iron deficiency anemia 01/29/2015  . H/O infectious disease 12/01/2013  . DD (diverticular disease) 10/02/2013  . Cardiac murmur 09/12/2013  . Chloasma 09/12/2013  . Absolute anemia 01/09/2013    Past Surgical History:  Procedure Laterality Date  . CHOLECYSTECTOMY    . COLONOSCOPY    . CYSTOSCOPY N/A 08/05/2016   Procedure: CYSTOSCOPY;  Surgeon: Bobbye Charleston, MD;  Location: [facility];  Service: Gynecology;  Laterality: N/A;  . ENDOMETRIAL ABLATION W/ NOVASURE    . GASTRIC BYPASS OPEN    . LYSIS OF ADHESION N/A 08/05/2016   Procedure: LYSIS OF ADHESION;  Surgeon: Bobbye Charleston, MD;  Location: [facility];  Service: Gynecology;  Laterality: N/A;  . perforated ulcer surgery     . ROBOTIC ASSISTED SALPINGO OOPHERECTOMY Right 05/12/2017   Procedure: XI ROBOTIC ASSISTED RIGHT OOPHORECTOMY, Fulgeration of Endometriosis, Peritoneal Biopsy;  Surgeon: Bobbye Charleston, MD;  Location: [facility];  Service: Gynecology;  Laterality: Right;  . ROBOTIC ASSISTED TOTAL HYSTERECTOMY WITH SALPINGECTOMY Bilateral 08/05/2016   Procedure: ROBOTIC ASSISTED TOTAL HYSTERECTOMY WITH SALPINGECTOMY;  Surgeon: Bobbye Charleston, MD;  Location: [facility];  Service: Gynecology;  Laterality: Bilateral;  . TONSILLECTOMY    . UPPER GI ENDOSCOPY      Prior to Admission medications   Medication Sig Start Date End Date Taking? Authorizing Provider  acetaminophen (TYLENOL) 500 MG tablet Take 1,000 mg by mouth every 6 (six) hours as needed for mild pain.    [provider]  ALPRAZolam Duanne Moron) 1 MG tablet Take 1 tablet by mouth daily as needed for anxiety.  01/24/15   [provider]  Biotin 10000 MCG TABS Take 10,000 mcg by mouth daily.    [provider]  buPROPion (WELLBUTRIN XL) 300 MG 24 hr tablet Take 300 mg by mouth daily.    [provider]  cephALEXin (KEFLEX) 500 MG capsule Take 1 capsule (500 mg total) by mouth 3 (three) times daily for 10 days. 09/13/17 09/23/17  Lannie Fields, PA-C  Cetirizine HCl 10 MG CAPS Take 10 mg by mouth daily as needed (allergies). allergies    [provider]  fluvoxaMINE (LUVOX) 100 MG tablet Take  300 mg by mouth daily.     [provider]  ibuprofen (ADVIL,MOTRIN) 800 MG tablet Take 800 mg by mouth daily as needed for moderate pain.     [provider]  lamoTRIgine (LAMICTAL) 200 MG tablet Take 400 mg by mouth daily.     [provider]  oxyCODONE-acetaminophen (PERCOCET/ROXICET) 5-325 MG tablet Take 1 tablet by mouth every 6 (six) hours as needed for moderate pain. 05/12/17   Bobbye Charleston, MD  Sod Fluoride-Potassium Nitrate (PREVIDENT 5000 SENSITIVE) 1.6-1 % PSTE 1 application to teeth twice daily  01/09/16   [provider]  valACYclovir (VALTREX) 1000 MG tablet Take 1000 mg by mouth once daily as needed for cold sores 06/03/15   [provider]  Vitamin D, Ergocalciferol, (DRISDOL) 50000 units CAPS capsule Take 50,000 Units by mouth every Monday.    [provider]    Allergies Doxepin; Morphine; Nsaids; and Trazodone  Family History  Problem Relation Age of Onset  . Pancreatic cancer Sister 43       died  . Breast cancer Maternal Aunt 60  . COPD Mother   . Anxiety disorder Mother   . Depression Mother   . Diabetes type II Mother   . Obesity Mother   . Crohn's disease Mother   . Coronary artery disease Father   . Diabetes type II Father   . Hypertension Father   . Obesity Sister   . Depression Maternal Grandmother   . Obesity Maternal Grandmother   . Cancer Maternal Grandmother   . Coronary artery disease Maternal Grandmother   . Coronary artery disease Paternal Grandfather   . Leukemia Paternal Grandfather   . Glaucoma Paternal Grandmother   . Hypertension Paternal Grandmother   . Osteoarthritis Paternal Grandmother   . Skin cancer Paternal Grandmother     Social History Social History   Tobacco Use  . Smoking status: Never Smoker  . Smokeless tobacco: Never Used  Substance Use Topics  . Alcohol use: Yes    Alcohol/week: 0.0 oz    Comment: rare  . Drug use: No     Review of Systems  Constitutional: No fever/chills Eyes: No visual changes. No discharge ENT: No upper respiratory complaints. Cardiovascular: no chest pain. Respiratory: no cough. No SOB. Gastrointestinal: No abdominal pain.  No nausea, no vomiting.  No diarrhea.  No constipation. Genitourinary: Negative for dysuria. No hematuria Musculoskeletal: Negative for musculoskeletal pain. Skin: Patient has 4 inch epigastric laceration.  Neurological: Negative for headaches, focal weakness or numbness.  ____________________________________________   PHYSICAL  EXAM:  VITAL SIGNS: ED Triage Vitals  Enc Vitals Group     BP 09/13/17 2101 (!) 118/99     Pulse Rate 09/13/17 2101 100     Resp 09/13/17 2101 17     Temp 09/13/17 2101 98 F (36.7 C)     Temp Source 09/13/17 2101 Oral     SpO2 09/13/17 2101 99 %     Weight 09/13/17 2101 280 lb (127 kg)     Height --      Head Circumference --      Peak Flow --      Pain Score 09/13/17 2221 0     Pain Loc --      Pain Edu? --      Excl. in [place]? --      Constitutional: Alert and oriented. Well appearing and in no acute distress. Eyes: Conjunctivae are normal. PERRL. EOMI. Head: Atraumatic. Cardiovascular: Normal rate, regular rhythm.  Normal S1 and S2.  Good peripheral circulation. Respiratory: Normal respiratory effort without tachypnea or retractions. Lungs CTAB. Good air entry to the bases with no decreased or absent breath sounds. Gastrointestinal: Bowel sounds 4 quadrants. Soft and nontender to palpation. No guarding or rigidity. No palpable masses. No distention. No CVA tenderness. Musculoskeletal: Full range of motion to all extremities. No gross deformities appreciated. Neurologic:  Normal speech and language. No gross focal neurologic deficits are appreciated.  Skin: Patient has a 4 cm linear epigastric laceration with adipose tissue exposure. Psychiatric: Mood and affect are normal. Speech and behavior are normal. Patient exhibits appropriate insight and judgement.   ____________________________________________   LABS (all labs ordered are listed, but only abnormal results are displayed)  Labs Reviewed - No data to display ____________________________________________  EKG   ____________________________________________  RADIOLOGY   No results found.  ____________________________________________    PROCEDURES  Procedure(s) performed:    Procedures  LACERATION REPAIR Performed by: Lannie Fields Authorized by: Lannie Fields Consent: Verbal consent  obtained. Risks and benefits: risks, benefits and alternatives were discussed Consent given by: patient Patient identity confirmed: provided demographic data Prepped and Draped in normal sterile fashion Wound explored  Laceration Location: Epigastric abdomen.   Laceration Length: 4 cm  No Foreign Bodies seen or palpated  Anesthesia: local infiltration  Local anesthetic: lidocaine 1% without epinephrine  Anesthetic total: 7.5 ml  Irrigation method: syringe Amount of cleaning: standard  Skin closure:  Deep: 4-0 Ethilon (4) Superficial 4-0 Monocryl (9)  Number of sutures:  13  Technique:  Deep: Subcutaneous interrupted  Superficial: Simple Interrupted   Patient tolerance: Patient tolerated the procedure well with no immediate complications.    Medications  lidocaine (XYLOCAINE) 1 % (with pres) injection 5 mL (5 mLs Infiltration Given 09/13/17 2221)     ____________________________________________   INITIAL IMPRESSION / ASSESSMENT AND PLAN / ED COURSE  Pertinent labs & imaging results that were available during my care of the patient were reviewed by me and considered in my medical decision making (see chart for details).  Review of the [place] CSRS was performed in accordance of the [place] prior to dispensing any controlled drugs.      Assessment and plan Laceration Patient presents to the emergency department with a 4 cm epigastric laceration repaired in the emergency department without complication.  Patient was advised to have external sutures removed in 1 week.  Patient was discharged with Keflex due to  complicated nature of laceration.  Vital signs are reassuring prior to discharge.  All patient questions were answered.  ____________________________________________  FINAL CLINICAL IMPRESSION(S) / ED DIAGNOSES  Final diagnoses:  Laceration of abdominal wall, initial encounter      NEW MEDICATIONS STARTED DURING THIS VISIT:  ED Discharge Orders         Ordered    cephALEXin (KEFLEX) 500 MG capsule  3 times daily     09/13/17 2216          This chart was dictated using voice recognition software/Dragon. Despite best efforts to proofread, errors can occur which can change the meaning. Any change was purely unintentional.    Lannie Fields, PA-C 09/13/17 2250    Orbie Pyo, MD 09/13/17 [id number]

## 2017-09-13 NOTE — ED Notes (Signed)
Pt c/o laceration to her abdomen while cutting fruit. Pt has 2 inch laceration across her abdomen with bleeding controlled.

## 2017-09-13 NOTE — ED Triage Notes (Signed)
Pt reports she was cutting a cantaloupe when she slipped and cut her left upper abdomen with kitchen knife. Pt with 2 inch laceration to the area, no bleeding, fatty tissue exposed.

## 2017-09-20 ENCOUNTER — Encounter (INDEPENDENT_AMBULATORY_CARE_PROVIDER_SITE_OTHER): Payer: Self-pay | Admitting: Vascular Surgery

## 2017-09-30 ENCOUNTER — Other Ambulatory Visit (INDEPENDENT_AMBULATORY_CARE_PROVIDER_SITE_OTHER): Payer: Self-pay | Admitting: Vascular Surgery

## 2017-09-30 DIAGNOSIS — R601 Generalized edema: Secondary | ICD-10-CM

## 2017-10-04 ENCOUNTER — Ambulatory Visit (INDEPENDENT_AMBULATORY_CARE_PROVIDER_SITE_OTHER): Payer: BLUE CROSS/BLUE SHIELD | Admitting: Vascular Surgery

## 2017-10-04 ENCOUNTER — Encounter

## 2017-10-04 ENCOUNTER — Ambulatory Visit (INDEPENDENT_AMBULATORY_CARE_PROVIDER_SITE_OTHER): Payer: BLUE CROSS/BLUE SHIELD

## 2017-10-04 ENCOUNTER — Encounter (INDEPENDENT_AMBULATORY_CARE_PROVIDER_SITE_OTHER): Payer: Self-pay | Admitting: Vascular Surgery

## 2017-10-04 DIAGNOSIS — I890 Lymphedema, not elsewhere classified: Secondary | ICD-10-CM | POA: Diagnosis not present

## 2017-10-04 DIAGNOSIS — R601 Generalized edema: Secondary | ICD-10-CM

## 2017-10-04 NOTE — Progress Notes (Signed)
MRN : [id number]  Tracy [name] is a 52 y.o. (07/28/1965) female who presents with chief complaint of No chief complaint on file. [name]  History of Present Illness:   Patient is seen for evaluation of leg swelling. The patient first noticed the swelling remotely but is now concerned because of a significant increase in the overall edema. The swelling is associated with mild pain. The patient notes that in the morning the legs are significantly improved but they steadily worsened throughout the course of the day. Elevation makes the legs better, dependency makes them much worse.   There is no history of ulcerations associated with the swelling.   The patient denies any recent changes in their medications.  The patient has not been wearing graduated compression.  The patient has no had any past angiography, interventions or vascular surgery.  The patient denies a history of DVT or PE. There is no prior history of phlebitis. There is no history of primary lymphedema.  There is no history of radiation treatment to the groin or pelvis No history of malignancies. No history of trauma or groin or pelvic surgery. No history of foreign travel or parasitic infections area    No outpatient medications have been marked as taking for the 10/04/17 encounter (Appointment) with Delana Meyer, Dolores Lory, MD.    Past Medical History:  Diagnosis Date  . Anemia   . Anxiety   . Anxiety and depression   . Arthritis    left knee  . Asthma    hardly uses inhaler  . Depression   . Diverticulosis   . Headache    Migraines  . Heart murmur   . Hypertension    history no longer requires medication management  . IDA (iron deficiency anemia)   . Melasma   . Perforated ulcer ([employer])   . PUD (peptic ulcer disease)   . Sleep apnea    mild, no CPAP ordered    Past Surgical History:  Procedure Laterality Date  . CHOLECYSTECTOMY    . COLONOSCOPY    . CYSTOSCOPY N/A 08/05/2016   Procedure: CYSTOSCOPY;   Surgeon: Bobbye Charleston, MD;  Location: [facility];  Service: Gynecology;  Laterality: N/A;  . ENDOMETRIAL ABLATION W/ NOVASURE    . GASTRIC BYPASS OPEN    . LYSIS OF ADHESION N/A 08/05/2016   Procedure: LYSIS OF ADHESION;  Surgeon: Bobbye Charleston, MD;  Location: [facility];  Service: Gynecology;  Laterality: N/A;  . perforated ulcer surgery    . ROBOTIC ASSISTED SALPINGO OOPHERECTOMY Right 05/12/2017   Procedure: XI ROBOTIC ASSISTED RIGHT OOPHORECTOMY, Fulgeration of Endometriosis, Peritoneal Biopsy;  Surgeon: Bobbye Charleston, MD;  Location: [facility];  Service: Gynecology;  Laterality: Right;  . ROBOTIC ASSISTED TOTAL HYSTERECTOMY WITH SALPINGECTOMY Bilateral 08/05/2016   Procedure: ROBOTIC ASSISTED TOTAL HYSTERECTOMY WITH SALPINGECTOMY;  Surgeon: Bobbye Charleston, MD;  Location: [facility];  Service: Gynecology;  Laterality: Bilateral;  . TONSILLECTOMY    . UPPER GI ENDOSCOPY      Social History Social History   Tobacco Use  . Smoking status: Never Smoker  . Smokeless tobacco: Never Used  Substance Use Topics  . Alcohol use: Yes    Alcohol/week: 0.0 oz    Comment: rare  . Drug use: No    Family History Family History  Problem Relation Age of Onset  . Pancreatic cancer Sister 85       died  . Breast cancer Maternal Aunt 60  . COPD Mother   . Anxiety disorder  Mother   . Depression Mother   . Diabetes type II Mother   . Obesity Mother   . Crohn's disease Mother   . Coronary artery disease Father   . Diabetes type II Father   . Hypertension Father   . Obesity Sister   . Depression Maternal Grandmother   . Obesity Maternal Grandmother   . Cancer Maternal Grandmother   . Coronary artery disease Maternal Grandmother   . Coronary artery disease Paternal Grandfather   . Leukemia Paternal Grandfather   . Glaucoma Paternal Grandmother   . Hypertension Paternal Grandmother   . Osteoarthritis Paternal Grandmother   . Skin cancer Paternal Grandmother   No family history of  bleeding/clotting disorders, porphyria or autoimmune disease   Allergies  Allergen Reactions  . Doxepin Other (See Comments)    sweats  . Morphine Hives  . Nsaids Other (See Comments)    H/o ulcer  . Trazodone Other (See Comments)    Didn't work for sleep Causes headaches     REVIEW OF SYSTEMS (Negative unless checked)  Constitutional: [] Weight loss  [] Fever  [] Chills Cardiac: [] Chest pain   [] Chest pressure   [] Palpitations   [] Shortness of breath when laying flat   [] Shortness of breath with exertion. Vascular:  [] Pain in legs with walking   [] Pain in legs at rest  [] History of DVT   [] Phlebitis   [x] Swelling in legs   [] Varicose veins   [] Non-healing ulcers Pulmonary:   [] Uses home oxygen   [] Productive cough   [] Hemoptysis   [] Wheeze  [] COPD   [] Asthma Neurologic:  [] Dizziness   [] Seizures   [] History of stroke   [] History of TIA  [] Aphasia   [] Vissual changes   [] Weakness or numbness in arm   [] Weakness or numbness in leg Musculoskeletal:   [] Joint swelling   [x] Joint pain   [] Low back pain Hematologic:  [] Easy bruising  [] Easy bleeding   [] Hypercoagulable state   [] Anemic Gastrointestinal:  [] Diarrhea   [] Vomiting  [] Gastroesophageal reflux/heartburn   [] Difficulty swallowing. Genitourinary:  [] Chronic kidney disease   [] Difficult urination  [] Frequent urination   [] Blood in urine Skin:  [x] Rashes   [] Ulcers  Psychological:  [] History of anxiety   []  History of major depression.  Physical Examination  There were no vitals filed for this visit. There is no height or weight on file to calculate BMI. Gen: WD/WN, NAD Head: [place]/AT, No temporalis wasting.  Ear/Nose/Throat: Hearing grossly intact, nares w/o erythema or drainage, poor dentition Eyes: PER, EOMI, sclera nonicteric.  Neck: Supple, no masses.  No bruit or JVD.  Pulmonary:  Good air movement, clear to auscultation bilaterally, no use of accessory muscles.  Cardiac: RRR, normal S1, S2, no Murmurs. Vascular: scattered  varicosities present bilaterally.  Mild venous stasis changes to the legs bilaterally.  2+ soft pitting edema Vessel Right Left  Radial Palpable Palpable  PT Palpable Palpable  DP Palpable Palpable   Gastrointestinal: soft, non-distended. No guarding/no peritoneal signs.  Musculoskeletal: M/S 5/5 throughout.  No deformity or atrophy.  Neurologic: CN 2-12 intact. Pain and light touch intact in extremities.  Symmetrical.  Speech is fluent. Motor exam as listed above. Psychiatric: Judgment intact, Mood & affect appropriate for pt's clinical situation. Dermatologic: mild rashes no ulcers noted.  No changes consistent with cellulitis. Lymph : No Cervical lymphadenopathy, no lichenification or skin changes of chronic lymphedema.  CBC Lab Results  Component Value Date   WBC 8.2 05/06/2017   HGB 10.4 (L) 05/06/2017   HCT 35.0 (  L) 05/06/2017   MCV 74.8 (L) 05/06/2017   PLT 343 05/06/2017    BMET    Component Value Date/Time   NA 139 05/06/2017 1117   NA 141 09/26/2013 1414   K 3.9 05/06/2017 1117   K 3.2 (L) 09/26/2013 1414   CL 99 (L) 05/06/2017 1117   CL 102 09/26/2013 1414   CO2 29 05/06/2017 1117   CO2 31 09/26/2013 1414   GLUCOSE 96 05/06/2017 1117   GLUCOSE 85 09/26/2013 1414   BUN 12 05/06/2017 1117   BUN 10 09/26/2013 1414   CREATININE 0.70 05/06/2017 1117   CREATININE 0.71 09/26/2013 1414   CALCIUM 9.1 05/06/2017 1117   CALCIUM 8.9 09/26/2013 1414   GFRNONAA >60 05/06/2017 1117   GFRNONAA >60 09/26/2013 1414   GFRAA >60 05/06/2017 1117   GFRAA >60 09/26/2013 1414   CrCl cannot be calculated (Patient's most recent lab result is older than the maximum 21 days allowed.).  COAG No results found for: INR, PROTIME  Radiology No results found.   Assessment/Plan 1. Lymphedema No surgery or intervention at this point in time.    I have had a long discussion with the patient regarding venous insufficiency and why it  causes symptoms. I have discussed with the patient  the chronic skin changes that accompany venous insufficiency and the long term sequela such as infection and ulceration.  Patient will begin wearing graduated compression stockings class 1 (20-30 mmHg) or compression wraps on a daily basis a prescription was given. The patient will put the stockings on first thing in the morning and removing them in the evening. The patient is instructed specifically not to sleep in the stockings.    In addition, behavioral modification including several periods of elevation of the lower extremities during the day will be continued. I have demonstrated that proper elevation is a position with the ankles at heart level.  The patient is instructed to begin routine exercise, especially walking on a daily basis  Patient's duplex ultrasound of the venous system does not show any DVT or reflux.  Following the review of the ultrasound the patient will follow up in 2-3 months to reassess the degree of swelling and the control that graduated compression stockings or compression wraps  is offering.   The patient can be assessed for a Lymph Pump at that time    Hortencia Pilar, MD  10/04/2017 8:16 AM

## 2017-12-15 ENCOUNTER — Encounter (INDEPENDENT_AMBULATORY_CARE_PROVIDER_SITE_OTHER): Payer: BLUE CROSS/BLUE SHIELD | Admitting: Vascular Surgery

## 2017-12-15 ENCOUNTER — Encounter (INDEPENDENT_AMBULATORY_CARE_PROVIDER_SITE_OTHER): Payer: BLUE CROSS/BLUE SHIELD

## 2017-12-27 ENCOUNTER — Ambulatory Visit (INDEPENDENT_AMBULATORY_CARE_PROVIDER_SITE_OTHER): Payer: BLUE CROSS/BLUE SHIELD | Admitting: Vascular Surgery

## 2018-01-03 ENCOUNTER — Ambulatory Visit (INDEPENDENT_AMBULATORY_CARE_PROVIDER_SITE_OTHER): Payer: BLUE CROSS/BLUE SHIELD | Admitting: Vascular Surgery

## 2018-08-12 ENCOUNTER — Other Ambulatory Visit: Payer: Self-pay | Admitting: Podiatry

## 2018-08-12 DIAGNOSIS — M7672 Peroneal tendinitis, left leg: Secondary | ICD-10-CM

## 2018-08-24 ENCOUNTER — Ambulatory Visit: Payer: BLUE CROSS/BLUE SHIELD

## 2018-08-31 ENCOUNTER — Other Ambulatory Visit: Payer: Self-pay | Admitting: Podiatry

## 2018-09-06 ENCOUNTER — Other Ambulatory Visit: Payer: Self-pay

## 2018-09-06 ENCOUNTER — Encounter
Admission: RE | Admit: 2018-09-06 | Discharge: 2018-09-06 | Disposition: A | Discharge status: Home / Self Care | Payer: BC Managed Care – PPO | Source: Ambulatory Visit | Attending: Podiatry | Admitting: Podiatry

## 2018-09-06 DIAGNOSIS — Z1159 Encounter for screening for other viral diseases: Secondary | ICD-10-CM | POA: Insufficient documentation

## 2018-09-06 NOTE — Patient Instructions (Signed)
Your procedure is scheduled on: September 09, 2018 FRIDAY Report to Day Surgery on the 2nd floor of the [facility]. To find out your arrival time, please call [phone] between 1PM - 3PM on: September 08, 2018 Thursday   REMEMBER: Instructions that are not followed completely may result in serious medical risk, up to and including death; or upon the discretion of your surgeon and anesthesiologist your surgery may need to be rescheduled.  Do not eat food after midnight the night before surgery.  No gum chewing, lozengers or hard candies.  You may however, drink CLEAR liquids up to 4:30AM  THEN DRINK CARBOHYDRATE DRINK FINISH BY 4:30 AM THEN ONLY SIPS WITH MED.  Clear liquids include: - water  - apple juice without pulp -CLEAR  gatorade - black coffee or tea (Do NOT add milk or creamers to the coffee or tea) Do NOT drink anything that is not on this list.  Type 1 and Type 2 diabetics should only drink water.   No Alcohol for 24 hours before or after surgery.  No Smoking including e-cigarettes for 24 hours prior to surgery.  No chewable tobacco products for at least 6 hours prior to surgery.  No nicotine patches on the day of surgery.  On the morning of surgery brush your teeth with toothpaste and water, you may rinse your mouth with mouthwash if you wish. Do not swallow any toothpaste or mouthwash.  Notify your doctor if there is any change in your medical condition (cold, fever, infection).  Do not wear jewelry, make-up, hairpins, clips or nail polish.  Do not wear lotions, powders, or perfumes.   Do not shave 48 hours prior to surgery.   Contacts and dentures may not be worn into surgery.  Do not bring valuables to the hospital, including drivers license, insurance or credit cards.  [facility] is not responsible for any belongings or valuables.   TAKE THESE MEDICATIONS THE MORNING OF SURGERY: PROTONIX (take one the night before and one on the morning of surgery - helps to  prevent nausea after surgery.) ALPRAZOLAM PRISTIQ LAMICTAL BUPROPION   Use CHG Soap  as directed on instruction sheet.  Use inhalers on the day of surgery   Stop Anti-inflammatories (NSAIDS) such as Advil, Aleve, Ibuprofen, Motrin, Naproxen, Naprosyn and Aspirin based products such as Excedrin, Goodys Powder, BC Powder. (May take Tylenol or Acetaminophen if needed.)  Stop ANY OVER THE COUNTER supplements until after surgery. (May continue Vitamin D, Vitamin B, and multivitamin.)  Wear comfortable clothing (specific to your surgery type) to the hospital.  Plan for stool softeners for home use.  If you are being discharged the day of surgery, you will not be allowed to drive home. You will need a responsible adult to drive you home and stay with you that night.   If you are taking public transportation, you will need to have a responsible adult with you. Please confirm with your physician that it is acceptable to use public transportation.   Please call [phone] if you have any questions about these instructions.

## 2018-09-08 LAB — NOVEL CORONAVIRUS, NAA (HOSP ORDER, SEND-OUT TO REF LAB; TAT 18-24 HRS): SARS-CoV-2, NAA: NOT DETECTED

## 2018-09-08 MED ORDER — CEFAZOLIN SODIUM-DEXTROSE 2-4 GM/100ML-% IV SOLN
2.0000 g | INTRAVENOUS | Status: AC
  Administered 2018-09-09: 2 g via INTRAVENOUS

## 2018-09-09 ENCOUNTER — Other Ambulatory Visit: Payer: Self-pay

## 2018-09-09 ENCOUNTER — Encounter
Admission: RE | Disposition: A | Discharge status: Home / Self Care | Payer: Self-pay | Source: Ambulatory Visit | Attending: Podiatry

## 2018-09-09 ENCOUNTER — Ambulatory Visit
Admission: RE | Admit: 2018-09-09 | Discharge: 2018-09-09 | Disposition: A | Discharge status: Home / Self Care | Payer: BC Managed Care – PPO | Source: Ambulatory Visit | Attending: Podiatry | Admitting: Podiatry

## 2018-09-09 ENCOUNTER — Ambulatory Visit: Payer: BC Managed Care – PPO | Admitting: Anesthesiology

## 2018-09-09 DIAGNOSIS — J45909 Unspecified asthma, uncomplicated: Secondary | ICD-10-CM | POA: Diagnosis not present

## 2018-09-09 DIAGNOSIS — M7672 Peroneal tendinitis, left leg: Secondary | ICD-10-CM | POA: Insufficient documentation

## 2018-09-09 DIAGNOSIS — I10 Essential (primary) hypertension: Secondary | ICD-10-CM | POA: Insufficient documentation

## 2018-09-09 DIAGNOSIS — M25372 Other instability, left ankle: Secondary | ICD-10-CM | POA: Insufficient documentation

## 2018-09-09 DIAGNOSIS — F329 Major depressive disorder, single episode, unspecified: Secondary | ICD-10-CM | POA: Diagnosis not present

## 2018-09-09 DIAGNOSIS — Z419 Encounter for procedure for purposes other than remedying health state, unspecified: Secondary | ICD-10-CM

## 2018-09-09 DIAGNOSIS — Z79899 Other long term (current) drug therapy: Secondary | ICD-10-CM | POA: Diagnosis not present

## 2018-09-09 DIAGNOSIS — F419 Anxiety disorder, unspecified: Secondary | ICD-10-CM | POA: Diagnosis not present

## 2018-09-09 DIAGNOSIS — G4730 Sleep apnea, unspecified: Secondary | ICD-10-CM | POA: Insufficient documentation

## 2018-09-09 HISTORY — PX: ANKLE RECONSTRUCTION: SHX1151

## 2018-09-09 HISTORY — PX: TENDON REPAIR: SHX5111

## 2018-09-09 SURGERY — RECONSTRUCTION, ANKLE
Anesthesia: General | Laterality: Left

## 2018-09-09 MED ORDER — DEXAMETHASONE SODIUM PHOSPHATE 10 MG/ML IJ SOLN
INTRAMUSCULAR | Status: DC | PRN
  Administered 2018-09-09: 10 mg via INTRAVENOUS

## 2018-09-09 MED ORDER — FENTANYL CITRATE (PF) 100 MCG/2ML IJ SOLN
INTRAMUSCULAR | Status: AC
  Filled 2018-09-09: qty 2

## 2018-09-09 MED ORDER — BUPIVACAINE-EPINEPHRINE (PF) 0.25% -1:200000 IJ SOLN
INTRAMUSCULAR | Status: AC
  Filled 2018-09-09: qty 30

## 2018-09-09 MED ORDER — ROCURONIUM BROMIDE 100 MG/10ML IV SOLN
INTRAVENOUS | Status: DC | PRN
  Administered 2018-09-09: 15 mg via INTRAVENOUS
  Administered 2018-09-09: 40 mg via INTRAVENOUS
  Administered 2018-09-09: 10 mg via INTRAVENOUS

## 2018-09-09 MED ORDER — SUCCINYLCHOLINE CHLORIDE 20 MG/ML IJ SOLN
INTRAMUSCULAR | Status: DC | PRN
  Administered 2018-09-09: 160 mg via INTRAVENOUS

## 2018-09-09 MED ORDER — ACETAMINOPHEN 10 MG/ML IV SOLN
INTRAVENOUS | Status: DC | PRN
  Administered 2018-09-09: 1000 mg via INTRAVENOUS

## 2018-09-09 MED ORDER — LIDOCAINE HCL (PF) 1 % IJ SOLN
INTRAMUSCULAR | Status: AC
  Filled 2018-09-09: qty 30

## 2018-09-09 MED ORDER — POVIDONE-IODINE 7.5 % EX SOLN
Freq: Once | CUTANEOUS | Status: DC
  Filled 2018-09-09: qty 118

## 2018-09-09 MED ORDER — LIDOCAINE HCL (CARDIAC) PF 100 MG/5ML IV SOSY
PREFILLED_SYRINGE | INTRAVENOUS | Status: DC | PRN
  Administered 2018-09-09: 100 mg via INTRAVENOUS

## 2018-09-09 MED ORDER — FENTANYL CITRATE (PF) 100 MCG/2ML IJ SOLN
INTRAMUSCULAR | Status: DC | PRN
  Administered 2018-09-09 (×2): 50 ug via INTRAVENOUS

## 2018-09-09 MED ORDER — FENTANYL CITRATE (PF) 100 MCG/2ML IJ SOLN: 25.0000 ug | INTRAMUSCULAR | Status: DC | PRN

## 2018-09-09 MED ORDER — ONDANSETRON HCL 4 MG/2ML IJ SOLN
INTRAMUSCULAR | Status: DC | PRN
  Administered 2018-09-09: 4 mg via INTRAVENOUS

## 2018-09-09 MED ORDER — MIDAZOLAM HCL 2 MG/2ML IJ SOLN
INTRAMUSCULAR | Status: DC | PRN
  Administered 2018-09-09: 2 mg via INTRAVENOUS

## 2018-09-09 MED ORDER — BUPIVACAINE-EPINEPHRINE (PF) 0.25% -1:200000 IJ SOLN
INTRAMUSCULAR | Status: DC | PRN
  Administered 2018-09-09 (×2): 10 mL

## 2018-09-09 MED ORDER — PROPOFOL 10 MG/ML IV BOLUS
INTRAVENOUS | Status: DC | PRN
  Administered 2018-09-09: 200 mg via INTRAVENOUS

## 2018-09-09 MED ORDER — LACTATED RINGERS IV SOLN
INTRAVENOUS | Status: DC
  Administered 2018-09-09 (×2): via INTRAVENOUS

## 2018-09-09 MED ORDER — PROPOFOL 10 MG/ML IV BOLUS
INTRAVENOUS | Status: AC
  Filled 2018-09-09: qty 20

## 2018-09-09 MED ORDER — BUPIVACAINE HCL (PF) 0.5 % IJ SOLN
INTRAMUSCULAR | Status: AC
  Filled 2018-09-09: qty 30

## 2018-09-09 MED ORDER — OXYCODONE-ACETAMINOPHEN 5-325 MG PO TABS: 1.0000 | ORAL_TABLET | Freq: Four times a day (QID) | ORAL | 0 refills | Status: AC | PRN

## 2018-09-09 MED ORDER — GLYCOPYRROLATE 0.2 MG/ML IJ SOLN
INTRAMUSCULAR | Status: DC | PRN
  Administered 2018-09-09: 0.2 mg via INTRAVENOUS

## 2018-09-09 MED ORDER — MEPERIDINE HCL 50 MG/ML IJ SOLN: 6.2500 mg | INTRAMUSCULAR | Status: DC | PRN

## 2018-09-09 MED ORDER — SUGAMMADEX SODIUM 200 MG/2ML IV SOLN
INTRAVENOUS | Status: DC | PRN
  Administered 2018-09-09: 285 mg via INTRAVENOUS

## 2018-09-09 MED ORDER — CEFAZOLIN SODIUM-DEXTROSE 2-4 GM/100ML-% IV SOLN
INTRAVENOUS | Status: AC
  Filled 2018-09-09: qty 100

## 2018-09-09 MED ORDER — PHENYLEPHRINE HCL (PRESSORS) 10 MG/ML IV SOLN
INTRAVENOUS | Status: DC | PRN
  Administered 2018-09-09 (×12): 100 ug via INTRAVENOUS

## 2018-09-09 MED ORDER — MIDAZOLAM HCL 2 MG/2ML IJ SOLN
INTRAMUSCULAR | Status: AC
  Filled 2018-09-09: qty 2

## 2018-09-09 MED ORDER — BUPIVACAINE LIPOSOME 1.3 % IJ SUSP
INTRAMUSCULAR | Status: AC
  Filled 2018-09-09: qty 20

## 2018-09-09 MED ORDER — PROMETHAZINE HCL 25 MG/ML IJ SOLN: 6.2500 mg | INTRAMUSCULAR | Status: DC | PRN

## 2018-09-09 MED ORDER — BUPIVACAINE LIPOSOME 1.3 % IJ SUSP
INTRAMUSCULAR | Status: DC | PRN
  Administered 2018-09-09 (×2): 10 mL

## 2018-09-09 SURGICAL SUPPLY — 46 items
BANDAGE ELASTIC 4 LF NS (GAUZE/BANDAGES/DRESSINGS) ×4 IMPLANT
BNDG CMPR MED 5X4 ELC HKLP NS (GAUZE/BANDAGES/DRESSINGS) ×2
BNDG COHESIVE 4X5 TAN STRL (GAUZE/BANDAGES/DRESSINGS) ×2 IMPLANT
BNDG CONFORM 2 STRL LF (GAUZE/BANDAGES/DRESSINGS) ×2 IMPLANT
BNDG CONFORM 3 STRL LF (GAUZE/BANDAGES/DRESSINGS) ×2 IMPLANT
BNDG ESMARK 4X12 TAN STRL LF (GAUZE/BANDAGES/DRESSINGS) ×2 IMPLANT
BNDG GAUZE 4.5X4.1 6PLY STRL (MISCELLANEOUS) ×2 IMPLANT
CANISTER SUCT 1200ML W/VALVE (MISCELLANEOUS) ×2 IMPLANT
COVER WAND RF STERILE (DRAPES) ×2 IMPLANT
CUFF TOURN SGL QUICK 18X4 (TOURNIQUET CUFF) ×1 IMPLANT
CUFF TOURN SGL QUICK 24 (TOURNIQUET CUFF)
CUFF TRNQT CYL 24X4X16.5-23 (TOURNIQUET CUFF) IMPLANT
DRAPE C-ARM XRAY 36X54 (DRAPES) ×1 IMPLANT
DRAPE C-ARMOR (DRAPES) ×1 IMPLANT
DURAPREP 26ML APPLICATOR (WOUND CARE) ×2 IMPLANT
ELECT REM PT RETURN 9FT ADLT (ELECTROSURGICAL) ×2
ELECTRODE REM PT RTRN 9FT ADLT (ELECTROSURGICAL) ×1 IMPLANT
GAUZE SPONGE 4X4 12PLY STRL (GAUZE/BANDAGES/DRESSINGS) ×2 IMPLANT
GAUZE XEROFORM 1X8 LF (GAUZE/BANDAGES/DRESSINGS) ×2 IMPLANT
GLOVE BIO SURGEON STRL SZ7.5 (GLOVE) ×2 IMPLANT
GLOVE INDICATOR 8.0 STRL GRN (GLOVE) ×2 IMPLANT
GOWN STRL REUS W/ TWL LRG LVL3 (GOWN DISPOSABLE) ×2 IMPLANT
GOWN STRL REUS W/TWL LRG LVL3 (GOWN DISPOSABLE) ×4
LABEL OR SOLS (LABEL) ×1 IMPLANT
NS IRRIG 500ML POUR BTL (IV SOLUTION) ×2 IMPLANT
PACK EXTREMITY ARMC (MISCELLANEOUS) ×2 IMPLANT
PAD PREP 24X41 OB/GYN DISP (PERSONAL CARE ITEMS) ×1 IMPLANT
PENCIL ELECTRO HAND CTR (MISCELLANEOUS) ×2 IMPLANT
SPLINT CAST 1 STEP 4X30 (MISCELLANEOUS) ×1 IMPLANT
SPLINT FAST PLASTER 5X30 (CAST SUPPLIES)
SPLINT PLASTER CAST FAST 5X30 (CAST SUPPLIES) ×1 IMPLANT
SPONGE LAP 18X18 RF (DISPOSABLE) ×2 IMPLANT
STAPLER SKIN PROX 35W (STAPLE) ×2 IMPLANT
STOCKINETTE M/LG 89821 (MISCELLANEOUS) ×2 IMPLANT
STRAP SAFETY 5IN WIDE (MISCELLANEOUS) ×2 IMPLANT
SUT ETHIBOND 2-0 (SUTURE) ×1 IMPLANT
SUT ETHILON 3-0 (SUTURE) ×3 IMPLANT
SUT PDS 2-0 27IN (SUTURE) ×1 IMPLANT
SUT VIC AB 2-0 CT1 27 (SUTURE) ×2
SUT VIC AB 2-0 CT1 TAPERPNT 27 (SUTURE) ×1 IMPLANT
SUT VIC AB 2-0 SH 27 (SUTURE) ×2
SUT VIC AB 2-0 SH 27XBRD (SUTURE) IMPLANT
SUT VIC AB 3-0 SH 27 (SUTURE) ×6
SUT VIC AB 3-0 SH 27X BRD (SUTURE) ×1 IMPLANT
WAND TOPAZ MICRO DEBRIDER (MISCELLANEOUS) ×1 IMPLANT
WIRE Z .062 C-WIRE SPADE TIP (WIRE) ×2 IMPLANT

## 2018-09-09 NOTE — H&P (Signed)
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  The various methods of treatment have been discussed with the patient.  No guarantees were given.  After consideration of risks, benefits and other options for treatment, the patient has consented to surgery.  I have reviewed the patients' chart and labs.     A history and physical examination was performed in my office.  The patient was reexamined.  There have been no changes to this history and physical examination.  Tracy [name] A

## 2018-09-09 NOTE — Anesthesia Postprocedure Evaluation (Signed)
Anesthesia Post Note  Patient: ELIZAVETA MATTICE  Procedure(s) Performed: Alvin Critchley LEFT (Left ) TENOLYSIS, MULTIPLE (Left )  Patient location during evaluation: PACU Anesthesia Type: General Level of consciousness: awake and alert and oriented Pain management: pain level controlled Vital Signs Assessment: post-procedure vital signs reviewed and stable Respiratory status: spontaneous breathing, nonlabored ventilation and respiratory function stable Cardiovascular status: blood pressure returned to baseline and stable Postop Assessment: no signs of nausea or vomiting Anesthetic complications: no     Last Vitals:  Vitals:   09/09/18 0945 09/09/18 1015  BP: (!) 104/58 114/62  Pulse: 80 84  Resp: 14 18  Temp:  36.6 C  SpO2: 97% 97%    Last Pain:  Vitals:   09/09/18 1015  TempSrc: Temporal  PainSc: 0-No pain                 Benancio Osmundson

## 2018-09-09 NOTE — Anesthesia Procedure Notes (Signed)

## 2018-09-09 NOTE — Discharge Instructions (Signed)
[facility] °[facility] ° °POST OPERATIVE INSTRUCTIONS FOR DR. TROXLER AND DR. FOWLER °[facility] ° ° °1. Take your medication as prescribed.  Pain medication should be taken only as needed. ° °2. Keep the dressing clean, dry and intact. ° °3. Keep your foot elevated above the heart level for the first 48 hours. ° °4. Walking to the bathroom and brief periods of walking are acceptable, unless we have instructed you to be non-weight bearing. ° °5. Always wear your post-op shoe when walking.  Always use your crutches if you are to be non-weight bearing. ° °6. Do not take a shower. Baths are permissible as long as the foot is kept out of the water.  ° °7. Every hour you are awake:  °- Bend your knee 15 times. °- Flex foot 15 times °- Massage calf 15 times ° °8. Call [facility] ([phone]) if any of the following problems occur: °- You develop a temperature or fever. °- The bandage becomes saturated with blood. °- Medication does not stop your pain. °- Injury of the foot occurs. °- Any symptoms of infection including redness, odor, or red streaks running from wound. ° ° ° ° °AMBULATORY SURGERY  °DISCHARGE INSTRUCTIONS ° ° °1) The drugs that you were given will stay in your system until tomorrow so for the next 24 hours you should not: ° °A) Drive an automobile °B) Make any legal decisions °C) Drink any alcoholic beverage ° ° °2) You may resume regular meals tomorrow.  Today it is better to start with liquids and gradually work up to solid foods. ° °You may eat anything you prefer, but it is better to start with liquids, then soup and crackers, and gradually work up to solid foods. ° ° °3) Please notify your doctor immediately if you have any unusual bleeding, trouble breathing, redness and pain at the surgery site, drainage, fever, or pain not relieved by medication. ° ° ° °4) Additional Instructions: ° ° ° ° ° ° ° °Please contact your physician with any  problems or Same Day Surgery at [phone], Monday through Friday 6 am to 4 pm, or [facility] Main number at [phone]. ° °

## 2018-09-09 NOTE — Anesthesia Preprocedure Evaluation (Signed)
Anesthesia Evaluation  Patient identified by MRN, date of birth, ID band Patient awake    Reviewed: Allergy & Precautions, NPO status , Patient's Chart, lab work & pertinent test results  History of Anesthesia Complications Negative for: history of anesthetic complications  Airway Mallampati: III  TM Distance: >3 FB Neck ROM: Full    Dental no notable dental hx.    Pulmonary asthma , sleep apnea ,    breath sounds clear to auscultation- rhonchi (-) wheezing      Cardiovascular hypertension, (-) CAD, (-) Past MI, (-) Cardiac Stents and (-) CABG  Rhythm:Regular Rate:Normal - Systolic murmurs and - Diastolic murmurs    Neuro/Psych  Headaches, neg Seizures PSYCHIATRIC DISORDERS Anxiety Depression    GI/Hepatic Neg liver ROS, PUD,   Endo/Other  negative endocrine ROSneg diabetes  Renal/GU negative Renal ROS     Musculoskeletal  (+) Arthritis ,   Abdominal (+) + obese,   Peds  Hematology  (+) anemia ,   Anesthesia Other Findings Past Medical History: No date: Anemia No date: Anxiety No date: Anxiety and depression No date: Arthritis     Comment:  left knee No date: Asthma     Comment:  hardly uses inhaler No date: Depression No date: Diverticulosis No date: Headache     Comment:  Migraines No date: Heart murmur No date: Hypertension     Comment:  history no longer requires medication management No date: IDA (iron deficiency anemia) No date: Melasma No date: Perforated ulcer ([facility]) No date: PUD (peptic ulcer disease) No date: Sleep apnea     Comment:  mild, no CPAP ordered   Reproductive/Obstetrics                            Anesthesia Physical Anesthesia Plan  ASA: III  Anesthesia Plan: General   Post-op Pain Management:    Induction: Intravenous  PONV Risk Score and Plan: 2 and Ondansetron, Dexamethasone and Midazolam  Airway Management Planned: Oral ETT  Additional  Equipment:   Intra-op Plan:   Post-operative Plan: Extubation in OR  Informed Consent: I have reviewed the patients History and Physical, chart, labs and discussed the procedure including the risks, benefits and alternatives for the proposed anesthesia with the patient or authorized representative who has indicated his/her understanding and acceptance.     Dental advisory given  Plan Discussed with: CRNA and Anesthesiologist  Anesthesia Plan Comments:         Anesthesia Quick Evaluation

## 2018-09-09 NOTE — Anesthesia Post-op Follow-up Note (Signed)
Anesthesia QCDR form completed.        

## 2018-09-09 NOTE — Transfer of Care (Signed)
Immediate Anesthesia Transfer of Care Note  Patient: Tracy [name]  Procedure(s) Performed: Alvin Critchley LEFT (Left ) TENOLYSIS, MULTIPLE (Left )  Patient Location: PACU  Anesthesia Type:General  Level of Consciousness: awake, alert  and oriented  Airway & Oxygen Therapy: Patient Spontanous Breathing and Patient connected to face mask oxygen  Post-op Assessment: Report given to RN and Post -op Vital signs reviewed and stable  Post vital signs: Reviewed and stable  Last Vitals:  Vitals Value Taken Time  BP    Temp    Pulse 90 09/09/18 0926  Resp 10 09/09/18 0926  SpO2 99 % 09/09/18 0926  Vitals shown include unvalidated device data.  Last Pain:  Vitals:   09/09/18 0629  TempSrc: Tympanic  PainSc: 2          Complications: No apparent anesthesia complications

## 2018-09-09 NOTE — Op Note (Signed)
Operative note   Surgeon:Marcelus Dubberly Lawyer: None    Preop diagnosis: 1.  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  Lateral ankle instability all left ankle    Postop diagnosis: Same    Procedure: 1.  Peroneal longus tenosynovectomy 2.  Peroneal brevis tenosynovectomy 3.  Brostrm Gould lateral ankle stabilization all left ankle    EBL: Minimal    Anesthesia:local and general.  Local consisted of a one-to-one mixture of 0.25% bupivacaine with epinephrine and Exparel long-acting anesthetic.  A total of 40 cc was used.    Hemostasis: Mid calf tourniquet inflated to 200 mmHg for approximately 1 hour    Specimen: Peroneal tendinitis    Complications: None    Operative indications:Tracy [name] is an 53 y.o. that presents today for surgical intervention.  The risks/benefits/alternatives/complications have been discussed and consent has been given.    Procedure:  Patient was brought into the OR and placed on the operating table in thesupine position. After anesthesia was obtained theleft lower extremity was prepped and draped in usual sterile fashion.  Attention was directed to the lateral aspect of the ankle where longitudinal incision was performed along the peroneal tendon from the posterior aspect of the ankle to just proximal to the fifth metatarsal base.  Sharp and blunt dissection carried down to the peroneal tendon sheath.  This was then entered.  A large amount of synovitis and fluid was removed initially.  Specks and then revealed a large amount of peroneal tendinitis with Tina synovitis adhered to the peroneal brevis tendon.  This was excised and removed from the surgical field in toto.  This was sent for pathological examination.  A smaller amount of tenosynovitis was noted adhered to the peroneal longus tendon.  This was also debrided and removed from the deep layers.  Further inspection did not reveal any obvious peroneal tendon tear.  The tendons were  both and then infiltrated with the Topaz wand.  The tendon sheath was then reapproximated.  The peroneal retinaculum was reapproximated with a pants over vest fashion with a 2-0 PDS.  The peroneal tendon sheath was reapproximated with a 3-0 Vicryl proximal and distal to the retinaculum.    Attention was then directed to the anterolateral aspect of the ankle.  Dissection was carried down and the extensor retinaculum was noted and tagged for later reapproximation.  The deeper anterior talofibular ligament region was noted.  This was quite thin and abnormal in nature.  A curved incision was placed along the distal fibula.  Next with a 2-0 Ethibond in a pants over vest fashion the deep anterior talofibular ligament was reanastomosed and tightened and the foot was held in a dorsiflexed and everted position.  The extensor retinaculum was brought more proximal and reanastomosed on the lateral aspect of the fibula with a 2-0 Vicryl.  Good stability was noted.  Final skin closure was performed with a 3-0 Vicryl for the deeper layers and subcutaneous tissue and a 3-0 nylon for skin.    Patient tolerated the procedure and anesthesia well.  Was transported from the OR to the PACU with all vital signs stable and vascular status intact. To be discharged per routine protocol.  Will follow up in approximately 1 week in the outpatient clinic.  A prescription for Percocet was sent to her pharmacy.

## 2018-09-12 LAB — SURGICAL PATHOLOGY

## 2019-03-09 ENCOUNTER — Ambulatory Visit: Payer: BC Managed Care – PPO | Attending: Internal Medicine

## 2019-03-09 DIAGNOSIS — Z20822 Contact with and (suspected) exposure to covid-19: Secondary | ICD-10-CM

## 2019-03-10 LAB — NOVEL CORONAVIRUS, NAA: SARS-CoV-2, NAA: NOT DETECTED

## 2020-01-03 ENCOUNTER — Other Ambulatory Visit: Payer: Self-pay | Admitting: Obstetrics and Gynecology

## 2020-01-03 DIAGNOSIS — R928 Other abnormal and inconclusive findings on diagnostic imaging of breast: Secondary | ICD-10-CM

## 2020-01-19 ENCOUNTER — Other Ambulatory Visit: Payer: Self-pay

## 2020-01-19 ENCOUNTER — Ambulatory Visit: Payer: BC Managed Care – PPO

## 2020-01-19 ENCOUNTER — Ambulatory Visit
Admission: RE | Admit: 2020-01-19 | Discharge: 2020-01-19 | Disposition: A | Discharge status: Home / Self Care | Payer: BC Managed Care – PPO | Source: Ambulatory Visit | Attending: Obstetrics and Gynecology | Admitting: Obstetrics and Gynecology

## 2020-01-19 DIAGNOSIS — R928 Other abnormal and inconclusive findings on diagnostic imaging of breast: Secondary | ICD-10-CM

## 2020-04-25 DIAGNOSIS — F332 Major depressive disorder, recurrent severe without psychotic features: Secondary | ICD-10-CM | POA: Diagnosis not present

## 2020-04-25 DIAGNOSIS — Z5181 Encounter for therapeutic drug level monitoring: Secondary | ICD-10-CM | POA: Diagnosis not present

## 2020-04-25 DIAGNOSIS — Z79891 Long term (current) use of opiate analgesic: Secondary | ICD-10-CM | POA: Diagnosis not present

## 2020-04-29 DIAGNOSIS — F339 Major depressive disorder, recurrent, unspecified: Secondary | ICD-10-CM | POA: Diagnosis not present

## 2020-05-09 DIAGNOSIS — E538 Deficiency of other specified B group vitamins: Secondary | ICD-10-CM | POA: Diagnosis not present

## 2020-05-09 DIAGNOSIS — Z6841 Body Mass Index (BMI) 40.0 and over, adult: Secondary | ICD-10-CM | POA: Diagnosis not present

## 2020-05-09 DIAGNOSIS — Z79891 Long term (current) use of opiate analgesic: Secondary | ICD-10-CM | POA: Diagnosis not present

## 2020-05-09 DIAGNOSIS — F332 Major depressive disorder, recurrent severe without psychotic features: Secondary | ICD-10-CM | POA: Diagnosis not present

## 2020-05-09 DIAGNOSIS — Z5181 Encounter for therapeutic drug level monitoring: Secondary | ICD-10-CM | POA: Diagnosis not present

## 2020-05-09 DIAGNOSIS — I10 Essential (primary) hypertension: Secondary | ICD-10-CM | POA: Diagnosis not present

## 2020-05-22 DIAGNOSIS — F332 Major depressive disorder, recurrent severe without psychotic features: Secondary | ICD-10-CM | POA: Diagnosis not present

## 2020-05-27 DIAGNOSIS — F332 Major depressive disorder, recurrent severe without psychotic features: Secondary | ICD-10-CM | POA: Diagnosis not present

## 2020-05-30 DIAGNOSIS — Z79891 Long term (current) use of opiate analgesic: Secondary | ICD-10-CM | POA: Diagnosis not present

## 2020-05-30 DIAGNOSIS — F332 Major depressive disorder, recurrent severe without psychotic features: Secondary | ICD-10-CM | POA: Diagnosis not present

## 2020-05-30 DIAGNOSIS — Z5181 Encounter for therapeutic drug level monitoring: Secondary | ICD-10-CM | POA: Diagnosis not present

## 2020-06-03 DIAGNOSIS — F332 Major depressive disorder, recurrent severe without psychotic features: Secondary | ICD-10-CM | POA: Diagnosis not present

## 2020-06-10 DIAGNOSIS — F332 Major depressive disorder, recurrent severe without psychotic features: Secondary | ICD-10-CM | POA: Diagnosis not present

## 2020-06-17 DIAGNOSIS — F332 Major depressive disorder, recurrent severe without psychotic features: Secondary | ICD-10-CM | POA: Diagnosis not present

## 2020-06-27 DIAGNOSIS — F332 Major depressive disorder, recurrent severe without psychotic features: Secondary | ICD-10-CM | POA: Diagnosis not present

## 2020-06-27 DIAGNOSIS — Z79891 Long term (current) use of opiate analgesic: Secondary | ICD-10-CM | POA: Diagnosis not present

## 2020-06-27 DIAGNOSIS — Z5181 Encounter for therapeutic drug level monitoring: Secondary | ICD-10-CM | POA: Diagnosis not present

## 2020-07-01 DIAGNOSIS — E538 Deficiency of other specified B group vitamins: Secondary | ICD-10-CM | POA: Diagnosis not present

## 2020-07-01 DIAGNOSIS — F332 Major depressive disorder, recurrent severe without psychotic features: Secondary | ICD-10-CM | POA: Diagnosis not present

## 2020-07-01 DIAGNOSIS — R21 Rash and other nonspecific skin eruption: Secondary | ICD-10-CM | POA: Diagnosis not present

## 2020-07-08 DIAGNOSIS — F332 Major depressive disorder, recurrent severe without psychotic features: Secondary | ICD-10-CM | POA: Diagnosis not present

## 2020-07-15 DIAGNOSIS — F332 Major depressive disorder, recurrent severe without psychotic features: Secondary | ICD-10-CM | POA: Diagnosis not present

## 2020-07-22 DIAGNOSIS — F332 Major depressive disorder, recurrent severe without psychotic features: Secondary | ICD-10-CM | POA: Diagnosis not present

## 2020-07-24 DIAGNOSIS — H5203 Hypermetropia, bilateral: Secondary | ICD-10-CM | POA: Diagnosis not present

## 2020-07-24 DIAGNOSIS — H04123 Dry eye syndrome of bilateral lacrimal glands: Secondary | ICD-10-CM | POA: Diagnosis not present

## 2020-07-25 DIAGNOSIS — M722 Plantar fascial fibromatosis: Secondary | ICD-10-CM | POA: Diagnosis not present

## 2020-07-25 DIAGNOSIS — M79672 Pain in left foot: Secondary | ICD-10-CM | POA: Diagnosis not present

## 2020-07-25 DIAGNOSIS — M7672 Peroneal tendinitis, left leg: Secondary | ICD-10-CM | POA: Diagnosis not present

## 2020-07-25 DIAGNOSIS — M79671 Pain in right foot: Secondary | ICD-10-CM | POA: Diagnosis not present

## 2020-07-29 DIAGNOSIS — F332 Major depressive disorder, recurrent severe without psychotic features: Secondary | ICD-10-CM | POA: Diagnosis not present

## 2020-07-31 DIAGNOSIS — D500 Iron deficiency anemia secondary to blood loss (chronic): Secondary | ICD-10-CM | POA: Diagnosis not present

## 2020-07-31 DIAGNOSIS — E538 Deficiency of other specified B group vitamins: Secondary | ICD-10-CM | POA: Diagnosis not present

## 2020-07-31 DIAGNOSIS — R21 Rash and other nonspecific skin eruption: Secondary | ICD-10-CM | POA: Diagnosis not present

## 2020-08-06 DIAGNOSIS — Z5181 Encounter for therapeutic drug level monitoring: Secondary | ICD-10-CM | POA: Diagnosis not present

## 2020-08-06 DIAGNOSIS — Z79891 Long term (current) use of opiate analgesic: Secondary | ICD-10-CM | POA: Diagnosis not present

## 2020-08-06 DIAGNOSIS — F332 Major depressive disorder, recurrent severe without psychotic features: Secondary | ICD-10-CM | POA: Diagnosis not present

## 2020-08-07 DIAGNOSIS — F332 Major depressive disorder, recurrent severe without psychotic features: Secondary | ICD-10-CM | POA: Diagnosis not present

## 2020-08-19 DIAGNOSIS — F332 Major depressive disorder, recurrent severe without psychotic features: Secondary | ICD-10-CM | POA: Diagnosis not present

## 2020-08-27 DIAGNOSIS — Z5181 Encounter for therapeutic drug level monitoring: Secondary | ICD-10-CM | POA: Diagnosis not present

## 2020-08-27 DIAGNOSIS — Z79891 Long term (current) use of opiate analgesic: Secondary | ICD-10-CM | POA: Diagnosis not present

## 2020-08-27 DIAGNOSIS — F332 Major depressive disorder, recurrent severe without psychotic features: Secondary | ICD-10-CM | POA: Diagnosis not present

## 2020-09-02 DIAGNOSIS — F332 Major depressive disorder, recurrent severe without psychotic features: Secondary | ICD-10-CM | POA: Diagnosis not present

## 2020-09-09 DIAGNOSIS — F332 Major depressive disorder, recurrent severe without psychotic features: Secondary | ICD-10-CM | POA: Diagnosis not present

## 2020-09-11 DIAGNOSIS — F332 Major depressive disorder, recurrent severe without psychotic features: Secondary | ICD-10-CM | POA: Diagnosis not present

## 2020-09-11 DIAGNOSIS — Z5181 Encounter for therapeutic drug level monitoring: Secondary | ICD-10-CM | POA: Diagnosis not present

## 2020-09-11 DIAGNOSIS — Z79891 Long term (current) use of opiate analgesic: Secondary | ICD-10-CM | POA: Diagnosis not present

## 2020-09-20 DIAGNOSIS — F332 Major depressive disorder, recurrent severe without psychotic features: Secondary | ICD-10-CM | POA: Diagnosis not present

## 2020-09-26 DIAGNOSIS — F332 Major depressive disorder, recurrent severe without psychotic features: Secondary | ICD-10-CM | POA: Diagnosis not present

## 2020-09-30 DIAGNOSIS — F332 Major depressive disorder, recurrent severe without psychotic features: Secondary | ICD-10-CM | POA: Diagnosis not present

## 2020-10-04 DIAGNOSIS — Z5181 Encounter for therapeutic drug level monitoring: Secondary | ICD-10-CM | POA: Diagnosis not present

## 2020-10-04 DIAGNOSIS — Z79891 Long term (current) use of opiate analgesic: Secondary | ICD-10-CM | POA: Diagnosis not present

## 2020-10-04 DIAGNOSIS — F332 Major depressive disorder, recurrent severe without psychotic features: Secondary | ICD-10-CM | POA: Diagnosis not present

## 2020-10-09 DIAGNOSIS — Z6841 Body Mass Index (BMI) 40.0 and over, adult: Secondary | ICD-10-CM | POA: Diagnosis not present

## 2020-10-09 DIAGNOSIS — M25562 Pain in left knee: Secondary | ICD-10-CM | POA: Diagnosis not present

## 2020-10-09 DIAGNOSIS — G8929 Other chronic pain: Secondary | ICD-10-CM | POA: Diagnosis not present

## 2020-10-09 DIAGNOSIS — M25561 Pain in right knee: Secondary | ICD-10-CM | POA: Diagnosis not present

## 2020-10-09 DIAGNOSIS — M170 Bilateral primary osteoarthritis of knee: Secondary | ICD-10-CM | POA: Diagnosis not present

## 2020-10-14 DIAGNOSIS — F332 Major depressive disorder, recurrent severe without psychotic features: Secondary | ICD-10-CM | POA: Diagnosis not present

## 2020-10-17 DIAGNOSIS — M170 Bilateral primary osteoarthritis of knee: Secondary | ICD-10-CM | POA: Diagnosis not present

## 2020-10-21 DIAGNOSIS — F332 Major depressive disorder, recurrent severe without psychotic features: Secondary | ICD-10-CM | POA: Diagnosis not present

## 2020-10-23 DIAGNOSIS — M170 Bilateral primary osteoarthritis of knee: Secondary | ICD-10-CM | POA: Diagnosis not present

## 2020-10-28 DIAGNOSIS — F332 Major depressive disorder, recurrent severe without psychotic features: Secondary | ICD-10-CM | POA: Diagnosis not present

## 2020-10-31 DIAGNOSIS — M170 Bilateral primary osteoarthritis of knee: Secondary | ICD-10-CM | POA: Diagnosis not present

## 2020-11-05 DIAGNOSIS — Z5181 Encounter for therapeutic drug level monitoring: Secondary | ICD-10-CM | POA: Diagnosis not present

## 2020-11-05 DIAGNOSIS — F332 Major depressive disorder, recurrent severe without psychotic features: Secondary | ICD-10-CM | POA: Diagnosis not present

## 2020-11-05 DIAGNOSIS — Z79891 Long term (current) use of opiate analgesic: Secondary | ICD-10-CM | POA: Diagnosis not present

## 2020-11-11 DIAGNOSIS — F332 Major depressive disorder, recurrent severe without psychotic features: Secondary | ICD-10-CM | POA: Diagnosis not present

## 2020-11-22 DIAGNOSIS — M940 Chondrocostal junction syndrome [Tietze]: Secondary | ICD-10-CM | POA: Diagnosis not present

## 2020-11-22 DIAGNOSIS — K279 Peptic ulcer, site unspecified, unspecified as acute or chronic, without hemorrhage or perforation: Secondary | ICD-10-CM | POA: Diagnosis not present

## 2020-11-25 DIAGNOSIS — I10 Essential (primary) hypertension: Secondary | ICD-10-CM | POA: Diagnosis not present

## 2020-11-25 DIAGNOSIS — M940 Chondrocostal junction syndrome [Tietze]: Secondary | ICD-10-CM | POA: Diagnosis not present

## 2020-11-25 DIAGNOSIS — Z13220 Encounter for screening for lipoid disorders: Secondary | ICD-10-CM | POA: Diagnosis not present

## 2020-11-25 DIAGNOSIS — Z23 Encounter for immunization: Secondary | ICD-10-CM | POA: Diagnosis not present

## 2020-11-25 DIAGNOSIS — F332 Major depressive disorder, recurrent severe without psychotic features: Secondary | ICD-10-CM | POA: Diagnosis not present

## 2020-11-25 DIAGNOSIS — E538 Deficiency of other specified B group vitamins: Secondary | ICD-10-CM | POA: Diagnosis not present

## 2020-11-25 DIAGNOSIS — Z1159 Encounter for screening for other viral diseases: Secondary | ICD-10-CM | POA: Diagnosis not present

## 2020-12-02 DIAGNOSIS — F332 Major depressive disorder, recurrent severe without psychotic features: Secondary | ICD-10-CM | POA: Diagnosis not present

## 2020-12-02 DIAGNOSIS — E538 Deficiency of other specified B group vitamins: Secondary | ICD-10-CM | POA: Diagnosis not present

## 2020-12-16 DIAGNOSIS — F332 Major depressive disorder, recurrent severe without psychotic features: Secondary | ICD-10-CM | POA: Diagnosis not present

## 2020-12-16 DIAGNOSIS — F411 Generalized anxiety disorder: Secondary | ICD-10-CM | POA: Diagnosis not present

## 2020-12-16 DIAGNOSIS — F410 Panic disorder [episodic paroxysmal anxiety] without agoraphobia: Secondary | ICD-10-CM | POA: Diagnosis not present

## 2020-12-26 DIAGNOSIS — Z5181 Encounter for therapeutic drug level monitoring: Secondary | ICD-10-CM | POA: Diagnosis not present

## 2020-12-26 DIAGNOSIS — Z79891 Long term (current) use of opiate analgesic: Secondary | ICD-10-CM | POA: Diagnosis not present

## 2020-12-26 DIAGNOSIS — F332 Major depressive disorder, recurrent severe without psychotic features: Secondary | ICD-10-CM | POA: Diagnosis not present

## 2020-12-30 DIAGNOSIS — F332 Major depressive disorder, recurrent severe without psychotic features: Secondary | ICD-10-CM | POA: Diagnosis not present

## 2021-01-02 DIAGNOSIS — Z01419 Encounter for gynecological examination (general) (routine) without abnormal findings: Secondary | ICD-10-CM | POA: Diagnosis not present

## 2021-01-02 DIAGNOSIS — N760 Acute vaginitis: Secondary | ICD-10-CM | POA: Diagnosis not present

## 2021-01-02 DIAGNOSIS — Z1231 Encounter for screening mammogram for malignant neoplasm of breast: Secondary | ICD-10-CM | POA: Diagnosis not present

## 2021-01-06 DIAGNOSIS — F332 Major depressive disorder, recurrent severe without psychotic features: Secondary | ICD-10-CM | POA: Diagnosis not present

## 2021-01-08 ENCOUNTER — Other Ambulatory Visit: Payer: Self-pay | Admitting: Obstetrics and Gynecology

## 2021-01-08 DIAGNOSIS — R928 Other abnormal and inconclusive findings on diagnostic imaging of breast: Secondary | ICD-10-CM

## 2021-01-10 DIAGNOSIS — Z5181 Encounter for therapeutic drug level monitoring: Secondary | ICD-10-CM | POA: Diagnosis not present

## 2021-01-10 DIAGNOSIS — F332 Major depressive disorder, recurrent severe without psychotic features: Secondary | ICD-10-CM | POA: Diagnosis not present

## 2021-01-10 DIAGNOSIS — Z79891 Long term (current) use of opiate analgesic: Secondary | ICD-10-CM | POA: Diagnosis not present

## 2021-01-13 DIAGNOSIS — F332 Major depressive disorder, recurrent severe without psychotic features: Secondary | ICD-10-CM | POA: Diagnosis not present

## 2021-01-15 ENCOUNTER — Ambulatory Visit
Admission: RE | Admit: 2021-01-15 | Discharge: 2021-01-15 | Disposition: A | Discharge status: Home / Self Care | Payer: BC Managed Care – PPO | Source: Ambulatory Visit | Attending: Obstetrics and Gynecology | Admitting: Obstetrics and Gynecology

## 2021-01-15 ENCOUNTER — Other Ambulatory Visit: Payer: Self-pay

## 2021-01-15 ENCOUNTER — Encounter: Payer: Self-pay | Admitting: Internal Medicine

## 2021-01-15 ENCOUNTER — Other Ambulatory Visit: Payer: Self-pay | Admitting: Obstetrics and Gynecology

## 2021-01-15 DIAGNOSIS — R928 Other abnormal and inconclusive findings on diagnostic imaging of breast: Secondary | ICD-10-CM

## 2021-01-15 DIAGNOSIS — R922 Inconclusive mammogram: Secondary | ICD-10-CM | POA: Diagnosis not present

## 2021-01-20 DIAGNOSIS — F332 Major depressive disorder, recurrent severe without psychotic features: Secondary | ICD-10-CM | POA: Diagnosis not present

## 2021-01-20 DIAGNOSIS — F411 Generalized anxiety disorder: Secondary | ICD-10-CM | POA: Diagnosis not present

## 2021-01-23 ENCOUNTER — Ambulatory Visit
Admission: RE | Admit: 2021-01-23 | Discharge: 2021-01-23 | Disposition: A | Discharge status: Home / Self Care | Payer: BC Managed Care – PPO | Source: Ambulatory Visit | Attending: Obstetrics and Gynecology | Admitting: Obstetrics and Gynecology

## 2021-01-23 ENCOUNTER — Other Ambulatory Visit: Payer: Self-pay

## 2021-01-23 DIAGNOSIS — N6322 Unspecified lump in the left breast, upper inner quadrant: Secondary | ICD-10-CM | POA: Diagnosis not present

## 2021-01-23 DIAGNOSIS — C50212 Malignant neoplasm of upper-inner quadrant of left female breast: Secondary | ICD-10-CM | POA: Diagnosis not present

## 2021-01-23 DIAGNOSIS — R928 Other abnormal and inconclusive findings on diagnostic imaging of breast: Secondary | ICD-10-CM

## 2021-01-23 DIAGNOSIS — Z170 Estrogen receptor positive status [ER+]: Secondary | ICD-10-CM | POA: Diagnosis not present

## 2021-01-27 DIAGNOSIS — F332 Major depressive disorder, recurrent severe without psychotic features: Secondary | ICD-10-CM | POA: Diagnosis not present

## 2021-01-30 DIAGNOSIS — Z79891 Long term (current) use of opiate analgesic: Secondary | ICD-10-CM | POA: Diagnosis not present

## 2021-01-30 DIAGNOSIS — Z5181 Encounter for therapeutic drug level monitoring: Secondary | ICD-10-CM | POA: Diagnosis not present

## 2021-01-30 DIAGNOSIS — F332 Major depressive disorder, recurrent severe without psychotic features: Secondary | ICD-10-CM | POA: Diagnosis not present

## 2021-02-03 ENCOUNTER — Ambulatory Visit: Payer: Self-pay | Admitting: Surgery

## 2021-02-03 DIAGNOSIS — C50912 Malignant neoplasm of unspecified site of left female breast: Secondary | ICD-10-CM

## 2021-02-03 DIAGNOSIS — Z170 Estrogen receptor positive status [ER+]: Secondary | ICD-10-CM | POA: Diagnosis not present

## 2021-02-03 DIAGNOSIS — I10 Essential (primary) hypertension: Secondary | ICD-10-CM | POA: Diagnosis not present

## 2021-02-03 DIAGNOSIS — C50212 Malignant neoplasm of upper-inner quadrant of left female breast: Secondary | ICD-10-CM | POA: Diagnosis not present

## 2021-02-05 ENCOUNTER — Other Ambulatory Visit: Payer: Self-pay | Admitting: Surgery

## 2021-02-05 DIAGNOSIS — Z170 Estrogen receptor positive status [ER+]: Secondary | ICD-10-CM

## 2021-02-05 DIAGNOSIS — C50912 Malignant neoplasm of unspecified site of left female breast: Secondary | ICD-10-CM

## 2021-02-05 DIAGNOSIS — F332 Major depressive disorder, recurrent severe without psychotic features: Secondary | ICD-10-CM | POA: Diagnosis not present

## 2021-02-10 DIAGNOSIS — F332 Major depressive disorder, recurrent severe without psychotic features: Secondary | ICD-10-CM | POA: Diagnosis not present

## 2021-02-11 NOTE — Progress Notes (Signed)
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 Marcello Moores [place] Scheduled for : LEFT BREAST LUMPECTOMY WITH RADIOACTIVE SEED AND SENTINEL LYMPH NODE BIOPSY  Past/Anticipated interventions by medical oncology, if any:  02/14/2021 Scheduled for consultation with Cassie Heilingoetter, PA-C   Lymphedema issues, if any:  Scheduled for PT evaluation on 02/13/2021  Pain issues, if any:  Patient denies   SAFETY ISSUES: Prior radiation? No Pacemaker/ICD? No Possible current pregnancy? No--hysterectomy Is the patient on methotrexate? No  Current Complaints / other details:  Nothing else of note

## 2021-02-11 NOTE — Progress Notes (Signed)
Radiation Oncology         [phone] ________________________________  Initial Outpatient Consultation by telephone.  The patient opted for telemedicine to maximize safety during the pandemic.  MyChart video was not obtainable.   Name: Tracy [name] MRN: [id number]  Date: 02/12/2021  DOB: [birth year]  LP:[id number], Ishmael Holter, MD  Erroll Luna, MD   REFERRING PHYSICIAN: Erroll Luna, MD  DIAGNOSIS:    ICD-10-CM   1. Malignant neoplasm of upper-inner quadrant of left breast in female, estrogen receptor positive ([place])  C50.212    Z17.0     2. Carcinoma of upper-inner quadrant of left breast in female, estrogen receptor positive ([place])  C50.212    Z17.0       Cancer Staging  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 ([place]) Staging form: Breast, AJCC 8th Edition - Clinical stage from 02/12/2021: Stage IA (cT1b, cN0, cM0, G2, ER+, PR+, HER2-) - Signed by Eppie Gibson, MD on 02/12/2021 Stage prefix: Initial diagnosis Histologic grading system: 3 grade system   CHIEF COMPLAINT: Here to discuss management of left breast cancer  HISTORY OF PRESENT ILLNESS::Tracy [name] is a 55 y.o. female who presented with left breast abnormality on the following imaging: bilateral screening mammogram on an unknown date.  No symptoms, if any, were reported at that time. Diagnostic unilateral left breast mammogram and ultrasound on 01/15/21 further revealed a suspicious irregular, hypoechoic left breast mass with posterior shadowing at the 11 o'clock position 6 cm from the nipple, measuring 7 x 6 x 6 mm. No findings of axillary lymphadenopathy were appreciated.  Left breast biopsy at the 11 o'clock position, 6cmfn, on date of 01/23/21 showed invasive mammary carcinoma measuring 0.4 cm in the greatest linear extent, and atypical lobular hyperplasia.  ER status: 90% positive; PR status 95% positive, both with strong staining intensity, Her2 status negative; proliferation marker Ki67 at 1%; Grade  2.  Subsequently, the patient was referred to Dr. Brantley Stage to discuss surgical options. Following discussion of the risks and benefits, the patient opted to proceed with left breast lumpectomy and SLN biopsies. Procedure is scheduled for 02/26/21.   Lymphedema issues, if any:  Scheduled for PT evaluation on 02/13/2021  Pain issues, if any:  Patient denies   SAFETY ISSUES: Prior radiation? No Pacemaker/ICD? No Possible current pregnancy? No--hysterectomy Is the patient on methotrexate? No  Current Complaints / other details:  Nothing else of note She is busy caring for her grand niece, and still adjusting to the death of her father. She has worked as a Surveyor, minerals in the past.   PREVIOUS RADIATION THERAPY: No  PAST MEDICAL HISTORY:  has a past medical history of Anemia, Anxiety, Anxiety and depression, Arthritis, Asthma, Depression, Diverticulosis, Headache, Heart murmur, Hypertension, IDA (iron deficiency anemia), Melasma, Perforated ulcer ([place]), PUD (peptic ulcer disease), and Sleep apnea.    PAST SURGICAL HISTORY: Past Surgical History:  Procedure Laterality Date   ANKLE RECONSTRUCTION Left 09/09/2018   Procedure: BROSTRUM-GOULD LEFT;  Surgeon: Samara Deist, DPM;  Location: [facility];  Service: Podiatry;  Laterality: Left;   CHOLECYSTECTOMY     COLONOSCOPY     CYSTOSCOPY N/A 08/05/2016   Procedure: CYSTOSCOPY;  Surgeon: Bobbye Charleston, MD;  Location: [facility];  Service: Gynecology;  Laterality: N/A;   ENDOMETRIAL ABLATION W/ NOVASURE     GASTRIC BYPASS OPEN     LYSIS OF ADHESION N/A 08/05/2016   Procedure: LYSIS OF ADHESION;  Surgeon: Bobbye Charleston, MD;  Location: [facility];  Service: Gynecology;  Laterality: N/A;  perforated ulcer surgery     ROBOTIC ASSISTED SALPINGO OOPHERECTOMY Right 05/12/2017   Procedure: XI ROBOTIC ASSISTED RIGHT OOPHORECTOMY, Fulgeration of Endometriosis, Peritoneal Biopsy;  Surgeon: Bobbye Charleston, MD;  Location: [facility];  Service: Gynecology;  Laterality:  Right;   ROBOTIC ASSISTED TOTAL HYSTERECTOMY WITH SALPINGECTOMY Bilateral 08/05/2016   Procedure: ROBOTIC ASSISTED TOTAL HYSTERECTOMY WITH SALPINGECTOMY;  Surgeon: Bobbye Charleston, MD;  Location: [facility];  Service: Gynecology;  Laterality: Bilateral;   TENDON REPAIR Left 09/09/2018   Procedure: TENOLYSIS, MULTIPLE;  Surgeon: Samara Deist, DPM;  Location: [facility];  Service: Podiatry;  Laterality: Left;   TONSILLECTOMY     UPPER GI ENDOSCOPY      FAMILY HISTORY: family history includes Anxiety disorder in her mother; Breast cancer (age of onset: 43) in her maternal aunt; COPD in her mother; Cancer in her maternal grandmother; Coronary artery disease in her father, maternal grandmother, and paternal grandfather; Crohn's disease in her mother; Depression in her maternal grandmother and mother; Diabetes type II in her father and mother; Glaucoma in her paternal grandmother; Hypertension in her father and paternal grandmother; Leukemia in her paternal grandfather; Obesity in her maternal grandmother, mother, and sister; Osteoarthritis in her paternal grandmother; Pancreatic cancer (age of onset: 55) in her sister; Skin cancer in her paternal grandmother.  SOCIAL HISTORY:  reports that she has never smoked. She has never used smokeless tobacco. She reports current alcohol use of about 1.0 - 2.0 standard drink per week. She reports that she does not use drugs.  ALLERGIES: Doxepin, Morphine, Nsaids, Tramadol, and Trazodone  MEDICATIONS:  Current Outpatient Medications  Medication Sig Dispense Refill   acetaminophen (TYLENOL) 500 MG tablet Take 1,000 mg by mouth every 6 (six) hours as needed for mild pain.     albuterol (PROVENTIL) (2.5 MG/3ML) 0.083% nebulizer solution Take 2.5 mg by nebulization every 6 (six) hours as needed for wheezing or shortness of breath.     albuterol (VENTOLIN HFA) 108 (90 Base) MCG/ACT inhaler Inhale 2 puffs into the lungs every 6 (six) hours as needed for wheezing or  shortness of breath.     ALPRAZolam (XANAX XR) 1 MG 24 hr tablet Take 1 mg by mouth 2 (two) times daily.     ALPRAZolam (XANAX) 0.5 MG tablet Take 0.5 tablets by mouth 2 (two) times daily as needed for anxiety.   0   azelastine (ASTELIN) 0.1 % nasal spray Place 1 spray into both nostrils daily.   12   buPROPion (WELLBUTRIN XL) 300 MG 24 hr tablet Take 300 mg by mouth daily.     Cetirizine HCl 10 MG CAPS Take 10 mg by mouth daily as needed (allergies). allergies     Cyanocobalamin 1000 MCG/ML KIT Inject 1,000 mcg as directed every 30 (thirty) days.     desvenlafaxine (PRISTIQ) 50 MG 24 hr tablet Take 50 mg by mouth daily.     fluticasone (FLONASE) 50 MCG/ACT nasal spray Place 1 spray into both nostrils daily.      gabapentin (NEURONTIN) 100 MG capsule Take 200 mg by mouth at bedtime.     lamoTRIgine (LAMICTAL) 200 MG tablet Take 400 mg by mouth 2 (two) times daily.      montelukast (SINGULAIR) 10 MG tablet Take 10 mg by mouth at bedtime.     Multiple Vitamin (MULTIVITAMIN WITH MINERALS) TABS tablet Take 1 tablet by mouth daily.     pantoprazole (PROTONIX) 40 MG tablet Take 40 mg by mouth daily.   5   valACYclovir (VALTREX)  1000 MG tablet Take 1,000 mg by mouth daily as needed (cold sores).      No current facility-administered medications for this encounter.    REVIEW OF SYSTEMS: As above in HPI.   PHYSICAL EXAM:  vitals were not taken for this visit.   General: Alert and oriented, in no acute distress     LABORATORY DATA:  Lab Results  Component Value Date   WBC 8.2 05/06/2017   HGB 10.4 (L) 05/06/2017   HCT 35.0 (L) 05/06/2017   MCV 74.8 (L) 05/06/2017   PLT 343 05/06/2017   CMP     Component Value Date/Time   NA 139 05/06/2017 1117   NA 141 09/26/2013 1414   K 3.9 05/06/2017 1117   K 3.2 (L) 09/26/2013 1414   CL 99 (L) 05/06/2017 1117   CL 102 09/26/2013 1414   CO2 29 05/06/2017 1117   CO2 31 09/26/2013 1414   GLUCOSE 96 05/06/2017 1117   GLUCOSE 85 09/26/2013 1414    BUN 12 05/06/2017 1117   BUN 10 09/26/2013 1414   CREATININE 0.70 05/06/2017 1117   CREATININE 0.71 09/26/2013 1414   CALCIUM 9.1 05/06/2017 1117   CALCIUM 8.9 09/26/2013 1414   PROT 7.5 09/26/2013 1414   ALBUMIN 3.5 09/26/2013 1414   AST 17 09/26/2013 1414   ALT 27 09/26/2013 1414   ALKPHOS 72 09/26/2013 1414   BILITOT < 0.1 (L) 09/26/2013 1414   GFRNONAA >60 05/06/2017 1117   GFRNONAA >60 09/26/2013 1414   GFRAA >60 05/06/2017 1117   GFRAA >60 09/26/2013 1414         RADIOGRAPHY: US BREAST LTD UNI LEFT INC AXILLA  Result Date: 01/15/2021 CLINICAL DATA:  55 year old female recalled from screening mammogram dated 01/02/2021 for possible left breast distortion. EXAM: DIGITAL DIAGNOSTIC UNILATERAL LEFT MAMMOGRAM WITH TOMOSYNTHESIS AND CAD; ULTRASOUND LEFT BREAST LIMITED TECHNIQUE: Left digital diagnostic mammography and breast tomosynthesis was performed. The images were evaluated with computer-aided detection.; Targeted ultrasound examination of the left breast was performed. COMPARISON:  Previous exam(s). ACR Breast Density Category b: There are scattered areas of fibroglandular density. FINDINGS: There is focal persistent distortion in the superior central left breast at mid depth. Further evaluation with ultrasound was performed. Targeted ultrasound is performed, showing an irregular, hypoechoic mass with posterior shadowing at the 11 o'clock position 6 cm from the nipple. It measures 7 x 6 x 6 mm. There is associated vascularity. Evaluation of the left axilla demonstrates no suspicious lymphadenopathy. IMPRESSION: 1. Suspicious left breast mass corresponding with the screening mammographic findings. 2. No suspicious left axillary lymphadenopathy. RECOMMENDATION: Ultrasound-guided biopsy of the left breast. I have discussed the findings and recommendations with the patient. If applicable, a reminder letter will be sent to the patient regarding the next appointment. BI-RADS CATEGORY  4:  Suspicious. Electronically Signed   By: Kristopher Oppenheim M.D.   On: 01/15/2021 15:06  MM DIAG BREAST TOMO UNI LEFT  Result Date: 01/15/2021 CLINICAL DATA:  55 year old female recalled from screening mammogram dated 01/02/2021 for possible left breast distortion. EXAM: DIGITAL DIAGNOSTIC UNILATERAL LEFT MAMMOGRAM WITH TOMOSYNTHESIS AND CAD; ULTRASOUND LEFT BREAST LIMITED TECHNIQUE: Left digital diagnostic mammography and breast tomosynthesis was performed. The images were evaluated with computer-aided detection.; Targeted ultrasound examination of the left breast was performed. COMPARISON:  Previous exam(s). ACR Breast Density Category b: There are scattered areas of fibroglandular density. FINDINGS: There is focal persistent distortion in the superior central left breast at mid depth. Further evaluation with ultrasound was performed. Targeted  ultrasound is performed, showing an irregular, hypoechoic mass with posterior shadowing at the 11 o'clock position 6 cm from the nipple. It measures 7 x 6 x 6 mm. There is associated vascularity. Evaluation of the left axilla demonstrates no suspicious lymphadenopathy. IMPRESSION: 1. Suspicious left breast mass corresponding with the screening mammographic findings. 2. No suspicious left axillary lymphadenopathy. RECOMMENDATION: Ultrasound-guided biopsy of the left breast. I have discussed the findings and recommendations with the patient. If applicable, a reminder letter will be sent to the patient regarding the next appointment. BI-RADS CATEGORY  4: Suspicious. Electronically Signed   By: Kristopher Oppenheim M.D.   On: 01/15/2021 15:06  MM CLIP PLACEMENT LEFT  Result Date: 01/23/2021 CLINICAL DATA:  Post procedure mammogram for clip placement. EXAM: 3D DIAGNOSTIC LEFT MAMMOGRAM POST ULTRASOUND BIOPSY COMPARISON:  Previous exam(s). FINDINGS: 3D Mammographic images were obtained following ultrasound guided biopsy of a mass in the left breast at 11 o'clock. The biopsy marking  clip is displaced approximately 1 cm anterior to the biopsied mass. IMPRESSION: Displacement of the ribbon biopsy marking clip in the left breast by approximately 1 cm anteriorly. Final Assessment: Post Procedure Mammograms for Marker Placement Electronically Signed   By: Audie Pinto M.D.   On: 01/23/2021 13:50  [place] LT BREAST BX W LOC DEV 1ST LESION IMG BX SPEC US GUIDE  Addendum Date: 01/27/2021   ADDENDUM REPORT: 01/27/2021 13:07 ADDENDUM: Pathology revealed INVASIVE MAMMARY CARCINOMA, LOBULAR NEOPLASIA (ATYPICAL LOBULAR HYPERPLASIA) of the Left breast, 11:00, 6cmfn, (ribbon clip). E-cadherin is POSITIVE supporting a ductal origin. This was found to be concordant by Dr. Audie Pinto. Pathology results were discussed with the patient by telephone. The patient reported doing well after the biopsy with tenderness and bruising at the site. Post biopsy instructions and care were reviewed and questions were answered. The patient was encouraged to call [facility] for any additional concerns. My direct phone number was provided. Surgical consultation has been arranged with Dr. Erroll Luna at [facility] on February 03, 2021. Pathology results reported by Terie Purser, RN on 01/27/2021. Electronically Signed   By: Audie Pinto M.D.   On: 01/27/2021 13:07   Result Date: 01/27/2021 CLINICAL DATA:  55 year old female presenting for biopsy of a left breast mass. EXAM: ULTRASOUND GUIDED LEFT BREAST CORE NEEDLE BIOPSY COMPARISON:  Previous exam(s). PROCEDURE: I met with the patient and we discussed the procedure of ultrasound-guided biopsy, including benefits and alternatives. We discussed the high likelihood of a successful procedure. We discussed the risks of the procedure, including infection, bleeding, tissue injury, clip migration, and inadequate sampling. Informed written consent was given. The usual time-out protocol was performed immediately prior to the  procedure. Lesion quadrant: Upper inner quadrant Using sterile technique and 1% Lidocaine as local anesthetic, under direct ultrasound visualization, a 14 gauge spring-loaded device was used to perform biopsy of a mass in the left breast at 11 o'clock using a lateral approach. At the conclusion of the procedure a ribbon tissue marker clip was deployed into the biopsy cavity. Follow up 2 view mammogram was performed and dictated separately. IMPRESSION: Ultrasound guided biopsy of a mass in the left breast at 11 o'clock. No apparent complications. Electronically Signed: By: Audie Pinto M.D. On: 01/23/2021 13:55     IMPRESSION/PLAN: left breast cancer, ER+, stage I   It was a pleasure meeting the patient today. We discussed the risks, benefits, and side effects of radiotherapy. I recommend radiotherapy to the left breast to reduce  her risk of locoregional recurrence by 2/3.  We discussed that radiation would take approximately 4 weeks to complete and that I would give the patient a few weeks to heal following surgery before starting treatment planning.  If chemotherapy were to be given, this would precede radiotherapy. We spoke about acute effects including skin irritation and fatigue as well as much less common late effects including internal organ injury or irritation. We spoke about the latest technology that is used to minimize the risk of late effects for patients undergoing radiotherapy to the breast or chest wall. No guarantees of treatment were given. The patient is enthusiastic about proceeding with treatment. I look forward to participating in the patient's care.  I will await her referral back to me for postoperative follow-up and eventual CT simulation/treatment planning.  This encounter was provided by telemedicine platform; patient desired telemedicine during pandemic precautions.  MyChart Video was not available and thus telephone was used. The patient has given verbal consent for this type  of encounter and has been advised to only accept a meeting of this type in a secure network environment. On date of service, in total, I spent 35 minutes on this encounter.   The attendants for this meeting include Eppie Gibson  and Zollie Beckers During the encounter, Eppie Gibson was located at [facility] Radiation Oncology Department.  Zollie Beckers was located at home.   __________________________________________   Eppie Gibson, MD  This document serves as a record of services personally performed by Eppie Gibson, MD. It was created on her behalf by Roney Mans, a trained medical scribe. The creation of this record is based on the scribe's personal observations and the provider's statements to them. This document has been checked and approved by the attending provider.

## 2021-02-12 ENCOUNTER — Encounter: Payer: Self-pay | Admitting: Radiation Oncology

## 2021-02-12 ENCOUNTER — Ambulatory Visit
Admission: RE | Admit: 2021-02-12 | Discharge: 2021-02-12 | Disposition: A | Discharge status: Home / Self Care | Payer: BC Managed Care – PPO | Source: Ambulatory Visit | Attending: Radiation Oncology | Admitting: Radiation Oncology

## 2021-02-12 ENCOUNTER — Ambulatory Visit: Payer: BC Managed Care – PPO

## 2021-02-12 ENCOUNTER — Telehealth: Payer: Self-pay | Admitting: Physician Assistant

## 2021-02-12 DIAGNOSIS — C50212 Malignant neoplasm of upper-inner quadrant of left female breast: Secondary | ICD-10-CM | POA: Diagnosis not present

## 2021-02-12 DIAGNOSIS — Z170 Estrogen receptor positive status [ER+]: Secondary | ICD-10-CM

## 2021-02-12 NOTE — Progress Notes (Signed)
[place] [phone]   [phone]  CONSULT NOTE  REFERRING PHYSICIAN: Dr. Brantley Stage   REASON FOR CONSULTATION:  Left ER/PR positive breast cancer   HPI AUBERY DATE is a 55 y.o. female past medical history significant for reactive airway disease/asthma, arthritis, gastric duodenal ulcer with perforation in 2010, anxiety and depression, and total hysterectomy and right oophorectomy in 2018 in 2019 is referred to the clinic for newly diagnosed left-sided ER/PR positive breast cancer.   The patient's evaluation began when she had a screening mammogram performed on 01/02/21 which is suspicious for possible left breast distortion.  She had a follow-up diagnostic mammogram performed on 01/15/2021 which showed a 7 x 6 x 6 mm irregular, hypoechoic mass in the 11 o'clock position suspicious for breast cancer.  No axillary lymphadenopathy was appreciated.  The patient underwent a ultrasound-guided left breast core needle biopsy which the result showed invasive mammary carcinoma. This is E-cadherin positive supporting ductal origin. She was found to be ER +90%, PR +95%, and HER2 negative by FISH.  The patient's Ki-67 was 1%.  Grade 2.  The patient was seen by [facility] surgery, Dr. Brantley Stage, on 02/03/2021.  The patient is planning to undergo a left breast seed localized lumpectomy with sentinel lymph node mapping on 02/25/2021.  The patient also had a physical therapy consultation on 02/13/2021.  They plan to see her in January.  They also recommended getting a prescription for a [employer] compression bra with extenders.  The patient is requesting a prescription today.    The patient saw Dr. Isidore Moos from radiation oncology on 02/12/2021. They discussed adjuvant radiation to reduce the risk of recurrence after surgery.    Overall, the patient is feeling fine today without any concerning complaints except she noted some left breast itching which  started yesterday.  The patient denies any new lotions, detergents, or soaps.  She reports that she did have some extensive bruising following her biopsy a few weeks ago, majority of which improved greatly.  The patient denies any lymphadenopathy, fevers, chills, appetite change, unusual weight loss, or bone pain.  The patient denies any overlying skin changes prior to her diagnosis. She denies nipple discharge, nipple inversion, or breast swelling.  The patient denies any history of cystic breast.  She reports she has a history of an abnormal mammogram several years ago but her diagnostic mammogram did not show any concerning findings.  She believes this was on the right breast.  She thinks she is not yet postmenopausal.  She had some hot flashes, which the most recent being 2 months ago.  She had a total hysterectomy in 2018.  She had a right oophorectomy in 2019 due to pelvic pain, cyst, and endometriosis.  The patient is unsure if she still has her left ovary.  Per review of the surgical notes, it does not appear the patient had a left oophorectomy  The patient's family history consists of a maternal grandfather who had chronic leukemia in his 79s.  The patient's paternal grandmother had melanoma which was excised.  The patient's maternal grandmother had stomach cancer around the age of 26.  The patient's father passed away secondary to lung cancer in August 2020.  The patient's sister passed away after being diagnosed with pancreatic cancer at the age of 30.  The patient states that she was genetically tested for predisposition to hereditary cancers.  She cannot remember the company but states that her results did not  show any hereditary markers tube predispose her to develop malignancy. The patient denies any family history of Jewish, African-American, Asian Pacific Islander, or Hispanic descent.  The patient is married.  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  She does not have any  biologic children but she presently has custody over her grandniece who is 90 years old.  She has had her since August 2022.  The patient denies any regular alcohol consumption until her father died in 2018-06-01.  Now she states that she is drinking 5-6 alcoholic beverages per week.  She is working on cutting back.  Her drink of choice is wine or vodka.  The patient denies any drug or tobacco use.    GYN HISTORY  Menarchal: 12 LMP: 31-May-2016 prior to hysterectomy. Rt ovary removal in 2017/05/31.  HRT: 2 weeks total in September 2022 d/ced due to cramping and 10 pound weight gain in 2 weeks. GP: 0 pregnancies and 0 live births.   HPI  Past Medical History:  Diagnosis Date   Anemia    Anxiety    Anxiety and depression    Arthritis    left knee   Asthma    hardly uses inhaler   Depression    Diverticulosis    Headache    Migraines   Heart murmur    Hypertension    history no longer requires medication management   IDA (iron deficiency anemia)    Melasma    Perforated ulcer (HCC)    PUD (peptic ulcer disease)    Sleep apnea    mild, no CPAP ordered    Past Surgical History:  Procedure Laterality Date   ANKLE RECONSTRUCTION Left 09/09/2018   Procedure: BROSTRUM-GOULD LEFT;  Surgeon: Samara Deist, DPM;  Location: [facility];  Service: Podiatry;  Laterality: Left;   CHOLECYSTECTOMY     COLONOSCOPY     CYSTOSCOPY N/A 08/05/2016   Procedure: CYSTOSCOPY;  Surgeon: Bobbye Charleston, MD;  Location: [facility];  Service: Gynecology;  Laterality: N/A;   ENDOMETRIAL ABLATION W/ NOVASURE     GASTRIC BYPASS OPEN     LYSIS OF ADHESION N/A 08/05/2016   Procedure: LYSIS OF ADHESION;  Surgeon: Bobbye Charleston, MD;  Location: [facility];  Service: Gynecology;  Laterality: N/A;   perforated ulcer surgery     ROBOTIC ASSISTED SALPINGO OOPHERECTOMY Right 05/12/2017   Procedure: XI ROBOTIC ASSISTED RIGHT OOPHORECTOMY, Fulgeration of Endometriosis, Peritoneal Biopsy;  Surgeon: Bobbye Charleston, MD;  Location: [facility];   Service: Gynecology;  Laterality: Right;   ROBOTIC ASSISTED TOTAL HYSTERECTOMY WITH SALPINGECTOMY Bilateral 08/05/2016   Procedure: ROBOTIC ASSISTED TOTAL HYSTERECTOMY WITH SALPINGECTOMY;  Surgeon: Bobbye Charleston, MD;  Location: [facility];  Service: Gynecology;  Laterality: Bilateral;   TENDON REPAIR Left 09/09/2018   Procedure: TENOLYSIS, MULTIPLE;  Surgeon: Samara Deist, DPM;  Location: [facility];  Service: Podiatry;  Laterality: Left;   TONSILLECTOMY     UPPER GI ENDOSCOPY      Family History  Problem Relation Age of Onset   Pancreatic cancer Sister 44       died   Breast cancer Maternal Aunt 83   COPD Mother    Anxiety disorder Mother    Depression Mother    Diabetes type II Mother    Obesity Mother    Crohn's disease Mother    Coronary artery disease Father    Diabetes type II Father    Hypertension Father    Obesity Sister    Depression Maternal Grandmother  Obesity Maternal Grandmother    Cancer Maternal Grandmother    Coronary artery disease Maternal Grandmother    Coronary artery disease Paternal Grandfather    Leukemia Paternal Grandfather    Glaucoma Paternal Grandmother    Hypertension Paternal Grandmother    Osteoarthritis Paternal Grandmother    Skin cancer Paternal Grandmother     Social History Social History   Tobacco Use   Smoking status: Never   Smokeless tobacco: Never  Vaping Use   Vaping Use: Never used  Substance Use Topics   Alcohol use: Yes    Alcohol/week: 1.0 - 2.0 standard drink    Types: 1 - 2 Glasses of wine per week   Drug use: No    Allergies  Allergen Reactions   Doxepin Other (See Comments)    sweats   Morphine Hives   Nsaids Other (See Comments)    H/o ulcer   Tramadol Nausea Only   Trazodone Other (See Comments)    Didn't work for sleep Causes headaches    Current Outpatient Medications  Medication Sig Dispense Refill   acetaminophen (TYLENOL) 500 MG tablet Take 1,000 mg by mouth every 6 (six) hours as needed for  mild pain.     albuterol (PROVENTIL) (2.5 MG/3ML) 0.083% nebulizer solution Take 2.5 mg by nebulization every 6 (six) hours as needed for wheezing or shortness of breath.     albuterol (VENTOLIN HFA) 108 (90 Base) MCG/ACT inhaler Inhale 2 puffs into the lungs every 6 (six) hours as needed for wheezing or shortness of breath.     ALPRAZolam (XANAX XR) 1 MG 24 hr tablet Take 1 mg by mouth 2 (two) times daily.     ALPRAZolam (XANAX) 0.5 MG tablet Take 0.5 tablets by mouth 2 (two) times daily as needed for anxiety.   0   azelastine (ASTELIN) 0.1 % nasal spray Place 1 spray into both nostrils daily.   12   buPROPion (WELLBUTRIN XL) 300 MG 24 hr tablet Take 300 mg by mouth daily.     Cetirizine HCl 10 MG CAPS Take 10 mg by mouth daily as needed (allergies). allergies     Cyanocobalamin 1000 MCG/ML KIT Inject 1,000 mcg as directed every 30 (thirty) days.     desvenlafaxine (PRISTIQ) 50 MG 24 hr tablet Take 50 mg by mouth daily.     fluticasone (FLONASE) 50 MCG/ACT nasal spray Place 1 spray into both nostrils daily.      gabapentin (NEURONTIN) 100 MG capsule Take 200 mg by mouth at bedtime.     lamoTRIgine (LAMICTAL) 200 MG tablet Take 400 mg by mouth 2 (two) times daily.      montelukast (SINGULAIR) 10 MG tablet Take 10 mg by mouth at bedtime.     Multiple Vitamin (MULTIVITAMIN WITH MINERALS) TABS tablet Take 1 tablet by mouth daily.     pantoprazole (PROTONIX) 40 MG tablet Take 40 mg by mouth daily.   5   valACYclovir (VALTREX) 1000 MG tablet Take 1,000 mg by mouth daily as needed (cold sores).      No current facility-administered medications for this visit.    REVIEW OF SYSTEMS:   Review of Systems  Constitutional: Negative for appetite change, chills, fatigue, fever and unexpected weight change.  HENT: Negative for mouth sores, nosebleeds, sore throat and trouble swallowing.   Eyes: Negative for eye problems and icterus.  Respiratory: Negative for cough, hemoptysis, shortness of breath and  wheezing.   Cardiovascular: Negative for chest pain and leg swelling.  Gastrointestinal:  Negative for abdominal pain, constipation, diarrhea, nausea and vomiting.  Genitourinary: Negative for bladder incontinence, difficulty urinating, dysuria, frequency and hematuria.   Musculoskeletal: Negative for back pain, gait problem, neck pain and neck stiffness.  Skin: Positive for itching over left breast Neurological: Negative for dizziness, extremity weakness, gait problem, headaches, light-headedness and seizures.  Hematological: Negative for adenopathy. Does not bruise/bleed easily.  Psychiatric/Behavioral: Negative for confusion, depression and sleep disturbance. The patient is not nervous/anxious.     PHYSICAL EXAMINATION:  Last menstrual period 07/20/2008.  ECOG PERFORMANCE STATUS: 0  Physical Exam  Constitutional: Oriented to person, place, and time and well-developed, well-nourished, and in no distress.   HENT:  Head: Normocephalic and atraumatic.  Mouth/Throat: Oropharynx is clear and moist. No oropharyngeal exudate.  Eyes: Conjunctivae are normal. Right eye exhibits no discharge. Left eye exhibits no discharge. No scleral icterus.  Neck: Normal range of motion. Neck supple.  Cardiovascular: Normal rate, regular rhythm, normal heart sounds and intact distal pulses.   Pulmonary/Chest: Effort normal and breath sounds normal. No respiratory distress. No wheezes. No rales.  Abdominal: Soft. Bowel sounds are normal. Exhibits no distension and no mass. There is no tenderness.  Musculoskeletal: Normal range of motion. Exhibits no edema.  Lymphadenopathy:    No cervical adenopathy.  Neurological: Alert and oriented to person, place, and time. Exhibits normal muscle tone. Gait normal. Coordination normal.  Skin: Skin is warm and dry. No rash noted. Not diaphoretic. No erythema. No pallor.  Psychiatric: Mood, memory and judgment normal.  Breasts: Breast inspection showed them to be  symmetrical with no nipple discharge. Palpation of the breasts and axilla revealed no obvious mass that I could appreciate. Some old bruising over surgical site from biopsy. Some mild erythema of left breast    Vitals reviewed.  LABORATORY DATA: Lab Results  Component Value Date   WBC 8.2 05/06/2017   HGB 10.4 (L) 05/06/2017   HCT 35.0 (L) 05/06/2017   MCV 74.8 (L) 05/06/2017   PLT 343 05/06/2017      Chemistry      Component Value Date/Time   NA 139 05/06/2017 1117   NA 141 09/26/2013 1414   K 3.9 05/06/2017 1117   K 3.2 (L) 09/26/2013 1414   CL 99 (L) 05/06/2017 1117   CL 102 09/26/2013 1414   CO2 29 05/06/2017 1117   CO2 31 09/26/2013 1414   BUN 12 05/06/2017 1117   BUN 10 09/26/2013 1414   CREATININE 0.70 05/06/2017 1117   CREATININE 0.71 09/26/2013 1414      Component Value Date/Time   CALCIUM 9.1 05/06/2017 1117   CALCIUM 8.9 09/26/2013 1414   ALKPHOS 72 09/26/2013 1414   AST 17 09/26/2013 1414   ALT 27 09/26/2013 1414   BILITOT < 0.1 (L) 09/26/2013 1414       RADIOGRAPHIC STUDIES: US BREAST LTD UNI LEFT INC AXILLA  Result Date: 01/15/2021 CLINICAL DATA:  55 year old female recalled from screening mammogram dated 01/02/2021 for possible left breast distortion. EXAM: DIGITAL DIAGNOSTIC UNILATERAL LEFT MAMMOGRAM WITH TOMOSYNTHESIS AND CAD; ULTRASOUND LEFT BREAST LIMITED TECHNIQUE: Left digital diagnostic mammography and breast tomosynthesis was performed. The images were evaluated with computer-aided detection.; Targeted ultrasound examination of the left breast was performed. COMPARISON:  Previous exam(s). ACR Breast Density Category b: There are scattered areas of fibroglandular density. FINDINGS: There is focal persistent distortion in the superior central left breast at mid depth. Further evaluation with ultrasound was performed. Targeted ultrasound is performed, showing an irregular, hypoechoic mass with  posterior shadowing at the 11 o'clock position 6 cm from  the nipple. It measures 7 x 6 x 6 mm. There is associated vascularity. Evaluation of the left axilla demonstrates no suspicious lymphadenopathy. IMPRESSION: 1. Suspicious left breast mass corresponding with the screening mammographic findings. 2. No suspicious left axillary lymphadenopathy. RECOMMENDATION: Ultrasound-guided biopsy of the left breast. I have discussed the findings and recommendations with the patient. If applicable, a reminder letter will be sent to the patient regarding the next appointment. BI-RADS CATEGORY  4: Suspicious. Electronically Signed   By: Kristopher Oppenheim M.D.   On: 01/15/2021 15:06  MM DIAG BREAST TOMO UNI LEFT  Result Date: 01/15/2021 CLINICAL DATA:  55 year old female recalled from screening mammogram dated 01/02/2021 for possible left breast distortion. EXAM: DIGITAL DIAGNOSTIC UNILATERAL LEFT MAMMOGRAM WITH TOMOSYNTHESIS AND CAD; ULTRASOUND LEFT BREAST LIMITED TECHNIQUE: Left digital diagnostic mammography and breast tomosynthesis was performed. The images were evaluated with computer-aided detection.; Targeted ultrasound examination of the left breast was performed. COMPARISON:  Previous exam(s). ACR Breast Density Category b: There are scattered areas of fibroglandular density. FINDINGS: There is focal persistent distortion in the superior central left breast at mid depth. Further evaluation with ultrasound was performed. Targeted ultrasound is performed, showing an irregular, hypoechoic mass with posterior shadowing at the 11 o'clock position 6 cm from the nipple. It measures 7 x 6 x 6 mm. There is associated vascularity. Evaluation of the left axilla demonstrates no suspicious lymphadenopathy. IMPRESSION: 1. Suspicious left breast mass corresponding with the screening mammographic findings. 2. No suspicious left axillary lymphadenopathy. RECOMMENDATION: Ultrasound-guided biopsy of the left breast. I have discussed the findings and recommendations with the patient. If  applicable, a reminder letter will be sent to the patient regarding the next appointment. BI-RADS CATEGORY  4: Suspicious. Electronically Signed   By: Kristopher Oppenheim M.D.   On: 01/15/2021 15:06  MM CLIP PLACEMENT LEFT  Result Date: 01/23/2021 CLINICAL DATA:  Post procedure mammogram for clip placement. EXAM: 3D DIAGNOSTIC LEFT MAMMOGRAM POST ULTRASOUND BIOPSY COMPARISON:  Previous exam(s). FINDINGS: 3D Mammographic images were obtained following ultrasound guided biopsy of a mass in the left breast at 11 o'clock. The biopsy marking clip is displaced approximately 1 cm anterior to the biopsied mass. IMPRESSION: Displacement of the ribbon biopsy marking clip in the left breast by approximately 1 cm anteriorly. Final Assessment: Post Procedure Mammograms for Marker Placement Electronically Signed   By: Audie Pinto M.D.   On: 01/23/2021 13:50  [place] LT BREAST BX W LOC DEV 1ST LESION IMG BX SPEC US GUIDE  Addendum Date: 01/27/2021   ADDENDUM REPORT: 01/27/2021 13:07 ADDENDUM: Pathology revealed INVASIVE MAMMARY CARCINOMA, LOBULAR NEOPLASIA (ATYPICAL LOBULAR HYPERPLASIA) of the Left breast, 11:00, 6cmfn, (ribbon clip). E-cadherin is POSITIVE supporting a ductal origin. This was found to be concordant by Dr. Audie Pinto. Pathology results were discussed with the patient by telephone. The patient reported doing well after the biopsy with tenderness and bruising at the site. Post biopsy instructions and care were reviewed and questions were answered. The patient was encouraged to call [facility] for any additional concerns. My direct phone number was provided. Surgical consultation has been arranged with Dr. Erroll Luna at [facility] on February 03, 2021. Pathology results reported by Terie Purser, RN on 01/27/2021. Electronically Signed   By: Audie Pinto M.D.   On: 01/27/2021 13:07   Result Date: 01/27/2021 CLINICAL DATA:  55 year old female presenting  for biopsy of a left breast mass.  EXAM: ULTRASOUND GUIDED LEFT BREAST CORE NEEDLE BIOPSY COMPARISON:  Previous exam(s). PROCEDURE: I met with the patient and we discussed the procedure of ultrasound-guided biopsy, including benefits and alternatives. We discussed the high likelihood of a successful procedure. We discussed the risks of the procedure, including infection, bleeding, tissue injury, clip migration, and inadequate sampling. Informed written consent was given. The usual time-out protocol was performed immediately prior to the procedure. Lesion quadrant: Upper inner quadrant Using sterile technique and 1% Lidocaine as local anesthetic, under direct ultrasound visualization, a 14 gauge spring-loaded device was used to perform biopsy of a mass in the left breast at 11 o'clock using a lateral approach. At the conclusion of the procedure a ribbon tissue marker clip was deployed into the biopsy cavity. Follow up 2 view mammogram was performed and dictated separately. IMPRESSION: Ultrasound guided biopsy of a mass in the left breast at 11 o'clock. No apparent complications. Electronically Signed: By: Audie Pinto M.D. 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  Diagnosed in November 2022   -Found on screening mammogram on 01/02/21. Further evaluation demonstrated a 7 x 6 x 6 mass in the left upper/inner quadrant of the breast 11 o'clock position.  No lymphadenopathy appreciated -Presented with a non-palpable left breast mass, measures 7 x 7 x6  m on imaging, with negative axillary lymph node. 11 o'clock position. ER positive, PR positive, and HER2 negative.  Ki-67 1%. Grade 2.  -She is a candidate for lumpectomy and sentinel  lymph node biopsy. The patient scheduled to undergo left breast seed localized lumpectomy with sentinel lymph node mapping on 02/25/2021 under the care of Dr. Brantley Stage. -Patient is scheduled for physical therapy in January. They recommended prescription for [facility] Compression bra with extenders. Prescription given to patient.  -She saw Dr. Isidore Moos a few days ago. She will likely benefit from breast radiation if she undergo lumpectomy to decrease the risk of breast cancer.  -The patient was seen with Dr. Burr Medico today.  Dr. Burr Medico discussed her imaging findings and the biopsy results in great detail. She also discussed favorable prognosis in early stage hormone positive breast cancer.  -Dr. Burr Medico recommend a Oncotype Dx test on the surgical sample if tumor is >5 mm given that it is grade 2. That will help her make a decision about adjuvant chemotherapy based on the Oncotype result.  Written material of this test was given to her. She is young and otherwise healthy, would be a good candidate for chemotherapy if her Oncotype recurrence score is high.  If the patient's tumor is less than 5 mm, Dr. Burr Medico would not recommend Oncotype testing as the patient would not be a candidate for adjuvant systemic IV chemotherapy. -If her surgical sentinel lymph node is positive, Dr. Burr Medico would recommend mammaprint for further risk stratification and guide adjuvant chemotherapy. -Giving the strong ER and PR expression, Dr. Burr Medico recommends adjuvant endocrine therapy for a total of 5-10 years to reduce the risk of cancer recurrence. Unclear if pre or postmenopausal. Will arrange [facility] and [facility] at her next lab visit to determine whether she should be on aromatase inhibitor or tamoxifen.  -We also discussed the breast cancer surveillance after her surgery. She will continue annual screening mammogram, self exam, and  a routine office visit with lab and exam with [place].  -Dr. Burr Medico encouraged her to have healthy diet and exercise  regularly.   2. Family History of Cancer/Genetics -Patient's sister diagnosed with pancreatic cancer at age 41. Due to this, the patient underwent genetic testing to determine her hereditary risk for cancer. -The patient is unsure of the name of the test she had performed but stated it was negative for hereditary risk of cancer.  PLAN: -Oncotype testing if tumor after surgical resection is greater than 5 mm --Follow up after surgery or radiation oncology based on oncotype  -Prescription given for compression bra -F[facility] and LH testing with next lab draw.  The patient voices understanding of current disease status and treatment options and is in agreement with the current care plan.  All questions were answered. The patient knows to call the clinic with any problems, questions or concerns. We can certainly see the patient much sooner if necessary.  Thank you so much for allowing me to participate in the care of Tracy [name]. I will continue to follow up the patient with you and assist in her care.   Disclaimer: This note was dictated with voice recognition software. Similar sounding words can inadvertently be transcribed and may not be corrected upon review.   Burnett Lieber L Nandika Stetzer February 12, 2021, 3:49 PM  Addendum  I have seen the patient, examined her. I agree with the assessment and and plan and have edited the notes.   55 yo female with mammogram discovered cT1bN0 stage Ia invasive ductal carcinoma, both ER and PR are strongly positive, HER2 negative, grade 2 with Ki67 1%.  I recommend Oncotype on her surgical sample, this is likely low risk disease given both ER/PR strong positivity and low Ki67.  I reviewed the indication for chemotherapy, if recurrence score from Oncotype is 26 or above.  I also discussed adjuvant endocrine therapy with tamoxifen or aromatase inhibitor, depends on her menopausal status.  We will plan to check [facility] and estradiol level before she starts.  She is  open to chemo and endocrine therapy if needed. I will see her back after adjuvant radiation, or sooner if Oncotype shows high risk disease.  All questions were answered.  Truitt Merle  02/14/2021

## 2021-02-12 NOTE — Telephone Encounter (Signed)
Scheduled appt per 11/28 referral. Pt is aware of appt date and time.  

## 2021-02-13 ENCOUNTER — Encounter: Payer: Self-pay | Admitting: Physical Therapy

## 2021-02-13 ENCOUNTER — Ambulatory Visit: Payer: BC Managed Care – PPO | Attending: Surgery | Admitting: Physical Therapy

## 2021-02-13 ENCOUNTER — Other Ambulatory Visit: Payer: Self-pay

## 2021-02-13 DIAGNOSIS — Z170 Estrogen receptor positive status [ER+]: Secondary | ICD-10-CM | POA: Insufficient documentation

## 2021-02-13 DIAGNOSIS — C50212 Malignant neoplasm of upper-inner quadrant of left female breast: Secondary | ICD-10-CM | POA: Insufficient documentation

## 2021-02-13 DIAGNOSIS — R293 Abnormal posture: Secondary | ICD-10-CM | POA: Diagnosis not present

## 2021-02-13 NOTE — Therapy (Signed)
[facility] @ [place], [place], [address] Phone: [phone]   Fax:  [phone]  Physical Therapy Evaluation  Patient Details  Name: Tracy [name] MRN: [id number] Date of Birth: 17-Dec-1965 Referring Provider (PT): Dr. Brantley Stage   Encounter Date: 02/13/2021   PT End of Session - 02/13/21 1314     Visit Number 1    Number of Visits 2    Date for PT Re-Evaluation 04/15/21    PT Start Time 0900    PT Stop Time 0955    PT Time Calculation (min) 55 min    Activity Tolerance Patient tolerated treatment well    Behavior During Therapy [facility] for tasks assessed/performed             Past Medical History:  Diagnosis Date   Anemia    Anxiety    Anxiety and depression    Arthritis    left knee   Asthma    hardly uses inhaler   Depression    Diverticulosis    Headache    Migraines   Heart murmur    Hypertension    history no longer requires medication management   IDA (iron deficiency anemia)    Melasma    Perforated ulcer (HCC)    PUD (peptic ulcer disease)    Sleep apnea    mild, no CPAP ordered    Past Surgical History:  Procedure Laterality Date   ANKLE RECONSTRUCTION Left 09/09/2018   Procedure: BROSTRUM-GOULD LEFT;  Surgeon: Samara Deist, DPM;  Location: [facility];  Service: Podiatry;  Laterality: Left;   CHOLECYSTECTOMY     COLONOSCOPY     CYSTOSCOPY N/A 08/05/2016   Procedure: CYSTOSCOPY;  Surgeon: Bobbye Charleston, MD;  Location: [facility];  Service: Gynecology;  Laterality: N/A;   ENDOMETRIAL ABLATION W/ NOVASURE     GASTRIC BYPASS OPEN     LYSIS OF ADHESION N/A 08/05/2016   Procedure: LYSIS OF ADHESION;  Surgeon: Bobbye Charleston, MD;  Location: [facility];  Service: Gynecology;  Laterality: N/A;   perforated ulcer surgery     ROBOTIC ASSISTED SALPINGO OOPHERECTOMY Right 05/12/2017   Procedure: XI ROBOTIC ASSISTED RIGHT OOPHORECTOMY, Fulgeration of Endometriosis, Peritoneal Biopsy;  Surgeon:  Bobbye Charleston, MD;  Location: [facility];  Service: Gynecology;  Laterality: Right;   ROBOTIC ASSISTED TOTAL HYSTERECTOMY WITH SALPINGECTOMY Bilateral 08/05/2016   Procedure: ROBOTIC ASSISTED TOTAL HYSTERECTOMY WITH SALPINGECTOMY;  Surgeon: Bobbye Charleston, MD;  Location: [facility];  Service: Gynecology;  Laterality: Bilateral;   TENDON REPAIR Left 09/09/2018   Procedure: TENOLYSIS, MULTIPLE;  Surgeon: Samara Deist, DPM;  Location: [facility];  Service: Podiatry;  Laterality: Left;   TONSILLECTOMY     UPPER GI ENDOSCOPY      There were no vitals filed for this visit.    Subjective Assessment - 02/13/21 0912     Subjective Pt  reports she has been overwhelmedwith lots of life events and the amount of appointments    Pertinent History 01/15/2021 left breast cancer ER/PR +, Her2 - Ki 1% Pt to have left lumpectomy 02/26/2021    Patient Stated Goals to find out what she needs to know for surgery    Currently in Pain? No/denies                [facility] PT Assessment - 02/13/21 0001       Assessment   Medical Diagnosis left breast cancer    Referring Provider (PT) Dr. Brantley Stage    Onset  Date/Surgical Date 01/15/21    Hand Dominance Right      Precautions   Precautions Other (comment)    Precaution Comments to have surgery and will be at risk for lymphedema      Restrictions   Weight Bearing Restrictions No      Balance Screen   Has the patient fallen in the past 6 months No    Has the patient had a decrease in activity level because of a fear of falling?  No    Is the patient reluctant to leave their home because of a fear of falling?  No      Home Social worker Private residence    Living Arrangements Spouse/significant other;Children   taking care ofd 90 1/2 year old neice   Available Help at Discharge Family      Prior Function   Level of [place] Unemployed    Leisure takes walks and plays with neice      Cognition   Overall  Cognitive Status Within Functional Limits for tasks assessed      Observation/Other Assessments   Observations Pt walks in and moves without difficulty    Other Surveys  Quick Dash    Quick DASH  2.27      Sensation   Light Touch Appears Intact      Coordination   Gross Motor Movements are Fluid and Coordinated Yes      Posture/Postural Control   Posture/Postural Control Postural limitations    Postural Limitations Rounded Shoulders;Decreased thoracic kyphosis    Posture Comments Obese with fullness at posterior axillary area      ROM / Strength   AROM / PROM / Strength AROM;Strength      AROM   Overall AROM  Within functional limits for tasks performed    AROM Assessment Site Shoulder    Right/Left Shoulder Right;Left    Right Shoulder Extension 55 Degrees    Right Shoulder Flexion 165 Degrees    Right Shoulder ABduction 160 Degrees    Right Shoulder Internal Rotation 50 Degrees    Right Shoulder External Rotation 90 Degrees    Left Shoulder Extension 64 Degrees    Left Shoulder Flexion 170 Degrees    Left Shoulder ABduction 165 Degrees    Left Shoulder Internal Rotation 60 Degrees    Left Shoulder External Rotation 95 Degrees      Strength   Overall Strength Within functional limits for tasks performed               LYMPHEDEMA/ONCOLOGY QUESTIONNAIRE - 02/13/21 0001       Type   Cancer Type left breasst      Surgeries   Mastectomy Date 02/26/21      Lymphedema Assessments   Lymphedema Assessments Upper extremities      Right Upper Extremity Lymphedema   10 cm Proximal to Olecranon Process 40 cm    Olecranon Process 28 cm    15 cm Proximal to Ulnar Styloid Process 29 cm    Just Proximal to Ulnar Styloid Process 18 cm    Across Hand at [employer] 20 cm    At [place] of 2nd Digit 6.5 cm      Left Upper Extremity Lymphedema   10 cm Proximal to Olecranon Process 40 cm    Olecranon Process 29 cm    15 cm Proximal to Ulnar Styloid Process 29.5 cm     Just Proximal to Ulnar  Styloid Process 18 cm    Across Hand at [employer] 20 cm    At [place] of 2nd Digit 6.2 cm             L-DEX FLOWSHEETS - 02/13/21 0900       L-DEX LYMPHEDEMA SCREENING   Measurement Type Unilateral    L-DEX MEASUREMENT EXTREMITY Upper Extremity    POSITION  Standing    DOMINANT SIDE Right    At Risk Side Left    BASELINE SCORE (UNILATERAL) 4.7                  Quick Dash - 02/13/21 0001     Open a tight or new jar No difficulty    Do heavy household chores (wash walls, wash floors) Mild difficulty    Carry a shopping bag or briefcase No difficulty    Wash your back No difficulty    Use a knife to cut food No difficulty    Recreational activities in which you take some force or impact through your arm, shoulder, or hand (golf, hammering, tennis) No difficulty    During the past week, to what extent has your arm, shoulder or hand problem interfered with your normal social activities with family, friends, neighbors, or groups? Not at all    During the past week, to what extent has your arm, shoulder or hand problem limited your work or other regular daily activities Not at all    Arm, shoulder, or hand pain. None    Tingling (pins and needles) in your arm, shoulder, or hand None    Difficulty Sleeping No difficulty    DASH Score 2.27 %              Objective measurements completed on examination: See above findings.       [facility] Adult PT Treatment/Exercise - 02/13/21 0001       Self-Care   Self-Care Other Self-Care Comments    Other Self-Care Comments  Pt shown Prarie PRIMA and given infomration about [employer]. This bra has extenders and will be better able to fit up to her axilla at lateral chest and back where there is somtimes fluid collection after surgery Pt is interested and will get a prescription from her MD appointment tomorrow.                           Breast Clinic Goals - 02/13/21 1320        Patient will be able to verbalize understanding of pertinent lymphedema risk reduction practices relevant to her diagnosis specifically related to skin care.   Time 8    Period Weeks    Status New      Patient will be able to return demonstrate and/or verbalize understanding of the post-op home exercise program related to regaining shoulder range of motion.   Period Days    Status Achieved      Patient will be able to verbalize understanding of the importance of attending the postoperative After Breast Cancer Class for further lymphedema risk reduction education and therapeutic exercise.   Time 8    Period Weeks    Status New      Additional Goals   Additional Goals Yes                   Plan - 02/13/21 1315     Clinical Impression Statement Pt comes to PT for baseline assessments  and to learn post op exercises.  She was given information to get a Prarie compression bra that comes up high at posteior axilla to prevent post op fluid colleciton there in addition to instruciton in exercises.  All questions answered and pt feels prepared for her surgery    Personal Factors and Comorbidities Comorbidity 2    Comorbidities breast cancer, obesity    Clinical Decision Making Low    Rehab Potential Good    PT Frequency Other (comment)    PT Duration 8 weeks   3 week post op visit   PT Treatment/Interventions Therapeutic exercise;Therapeutic activities;Manual lymph drainage;Manual techniques;Passive range of motion;Patient/family education;Orthotic Fit/Training    PT Next Visit Plan reassess post op, progress exercises    PT Home Exercise Plan post op exercises    Consulted and Agree with Plan of Care Patient            Patient will follow up at outpatient cancer rehab 3-4 weeks following surgery.  If the patient requires physical therapy at that time, a specific plan will be dictated and sent to the referring physician for approval. The patient was educated today on  appropriate basic range of motion exercises to begin post operatively and the importance of attending the After Breast Cancer class following surgery.  Patient was educated today on lymphedema risk reduction practices as it pertains to recommendations that will benefit the patient immediately following surgery.  She verbalized good understanding.     The patient was assessed using the L-Dex machine today to produce a lymphedema index baseline score. The patient will be reassessed on a regular basis (typically every 3 months) to obtain new L-Dex scores. If the score is > 6.5 points away from his/her baseline score indicating onset of subclinical lymphedema, it will be recommended to wear a compression garment for 4 weeks, 12 hours per day and then be reassessed. If the score continues to be > 6.5 points from baseline at reassessment, we will initiate lymphedema treatment. Assessing in this manner has a 95% rate of preventing clinically significant lymphedema.  Patient will benefit from skilled therapeutic intervention in order to improve the following deficits and impairments:  Decreased knowledge of precautions, Decreased knowledge of use of DME, Postural dysfunction  Visit Diagnosis: Malignant neoplasm of upper-inner quadrant of left breast in female, estrogen receptor positive ([facility])  Abnormal posture     Problem List Patient Active Problem List   Diagnosis Date Noted   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 ([place]) 02/12/2021   Lymphedema 10/04/2017   Postoperative state 08/05/2016   Anxiety and depression 01/29/2015   Airway hyperreactivity 01/29/2015   Gastroduodenal ulcer 01/29/2015   Other iron deficiency anemia 01/29/2015   H/O infectious disease 12/01/2013   DD (diverticular disease) 10/02/2013   Cardiac murmur 09/12/2013   Chloasma 09/12/2013   Absolute anemia 01/09/2013   Donato Heinz. Owens Shark PT  Norwood Levo, PT 02/13/2021, 1:21 PM  [facility] @ [place], [place], [address] Phone: [phone]   Fax:  [phone]  Name: Tracy [name] MRN: [id number] Date of Birth: 02/27/1966

## 2021-02-13 NOTE — Patient Instructions (Addendum)
First of all, check with your insurance company to see if provider is in [place] (for wigs and compression sleeves / gloves/gauntlets )   [address] [place], [place] Phone: [phone]   Fax: [phone] Will file some insurances --- call for appointment   [facility] (for mastectomy prosthetics and garments) [address] [place], [place]  Phone: [phone]  Fax: [phone] Will file some insurances --- call for appointment  [facility]  [address]  [place], [place] [address] [phone] Lower extremity garments  [facility] [address] [place], [place]  [address] [place] and [facility] (for compression garments, especilly for lower extremities) [address] [place], [place]  [address] [phone] Call for appointment    Jerrol Banana ,certified fitter [facility]  [phone]  [employer] (for mastectomy supplies and garments) [place] [place], [place] [address] [phone]  Other Resources: National Lymphedema Network:  www.lymphnet.org www.Klosetraining.com for patient articles and self manual lymph drainage information www.lymphedemablog.com has informative articles.  DishTag.es.com www.lymphedemaproducts.com www.brightlifedirect.com    Physical Therapy Information for After Breast Cancer Surgery/Treatment:  Lymphedema is a swelling condition that you may be at risk for in your arm if you have lymph nodes removed from the armpit area.  After a sentinel node biopsy, the risk is approximately 5-9% and is higher after an axillary node dissection.  There is treatment available for this condition and it is not life-threatening.  Contact your physician or physical therapist with concerns. You may begin the 4 shoulder/posture exercises (see additional sheet)  when permitted by your physician (typically a week after surgery).  If you have drains, you may need to wait until those are removed before beginning range of motion exercises.  A general recommendation is to not lift your arms above shoulder height until drains are removed.  These exercises should be done to your tolerance and gently.  This is not a "no pain/no gain" type of recovery so listen to your body and stretch into the range of motion that you can tolerate, stopping if you have pain.  If you are having immediate reconstruction, ask your plastic surgeon about doing exercises as he or she may want you to wait. We encourage you to attend the free one time ABC (After Breast Cancer) class offered by [facility].  You will learn information related to lymphedema risk, prevention and treatment and additional exercises to regain mobility following surgery.  You can call [phone] for more information.  This is offered the 1st and 3rd Monday of each month.  You only attend the class one time. While undergoing any medical procedure or treatment, try to avoid blood pressure being taken or needle sticks from occurring on the arm on the side of cancer.   This recommendation begins after surgery and continues for the rest of your life.  This may help reduce your risk of getting lymphedema (swelling in your arm). An excellent resource for those seeking information on lymphedema is the National Lymphedema Network's web site. It can be accessed at [place].org If you notice swelling in your hand, arm or breast at any time following surgery (even if it is many years from now), please contact your doctor or physical therapist to discuss this.  Lymphedema can be treated at any time but it is easier for you if it is  treated early on.  If you feel like your shoulder motion is not returning to normal in a reasonable amount of time, please contact your surgeon or physical therapist.  [facility].  [place], [place], [place] [phone]; [address], [place], [place] [address] ABC CLASS After Breast Cancer Class  After Breast Cancer Class is a specially designed exercise class to assist you in a safe recover after having breast cancer surgery.  In this class you will learn how to get back to full function whether your drains were just removed or if you had surgery a month ago.  This one-time class is held the 1st and 3rd Monday of every month from 11:00 a.m. until 12:00 noon at the [facility] located at [place], [place] [address]  This class is FREE and space is limited. For more information or to register for the next available class, call [phone].  Class Goals  Understand specific stretches to improve the flexibility of you chest and shoulder. Learn ways to safely strengthen your upper body and improve your posture. Understand the warning signs of infection and why you may be at risk for an arm infection. Learn about Lymphedema and prevention.  ** You do not attend this class until after surgery.  Drains must be removed to participate  Patient was instructed today in a home exercise program today for post op shoulder range of motion. These included active assist shoulder flexion in sitting, scapular retraction, wall walking with shoulder abduction, and hands behind head external rotation.  She was encouraged to do these twice a day, holding 3 seconds and repeating 5 times when permitted by her physician.

## 2021-02-14 ENCOUNTER — Encounter: Payer: Self-pay | Admitting: Physician Assistant

## 2021-02-14 ENCOUNTER — Inpatient Hospital Stay: Payer: BC Managed Care – PPO | Attending: Physician Assistant | Admitting: Physician Assistant

## 2021-02-14 VITALS — BP 151/83 | HR 78 | Temp 97.5°F | Resp 20 | Ht 66.0 in | Wt 307.8 lb

## 2021-02-14 DIAGNOSIS — C50212 Malignant neoplasm of upper-inner quadrant of left female breast: Secondary | ICD-10-CM | POA: Insufficient documentation

## 2021-02-14 DIAGNOSIS — Z800 Family history of malignant neoplasm of digestive organs: Secondary | ICD-10-CM | POA: Insufficient documentation

## 2021-02-14 DIAGNOSIS — Z170 Estrogen receptor positive status [ER+]: Secondary | ICD-10-CM | POA: Insufficient documentation

## 2021-02-14 DIAGNOSIS — Z79899 Other long term (current) drug therapy: Secondary | ICD-10-CM | POA: Diagnosis not present

## 2021-02-16 ENCOUNTER — Encounter: Payer: Self-pay | Admitting: Physician Assistant

## 2021-02-16 ENCOUNTER — Encounter: Payer: Self-pay | Admitting: Internal Medicine

## 2021-02-17 DIAGNOSIS — F332 Major depressive disorder, recurrent severe without psychotic features: Secondary | ICD-10-CM | POA: Diagnosis not present

## 2021-02-18 ENCOUNTER — Other Ambulatory Visit: Payer: Self-pay

## 2021-02-18 ENCOUNTER — Telehealth: Payer: Self-pay | Admitting: *Deleted

## 2021-02-18 ENCOUNTER — Encounter (HOSPITAL_COMMUNITY): Payer: Self-pay

## 2021-02-18 ENCOUNTER — Encounter: Payer: Self-pay | Admitting: *Deleted

## 2021-02-18 ENCOUNTER — Encounter (HOSPITAL_COMMUNITY)
Admission: RE | Admit: 2021-02-18 | Discharge: 2021-02-18 | Disposition: A | Discharge status: Home / Self Care | Payer: BC Managed Care – PPO | Source: Ambulatory Visit | Attending: Surgery | Admitting: Surgery

## 2021-02-18 VITALS — BP 144/80 | HR 83 | Temp 98.0°F | Resp 19 | Ht 66.0 in | Wt 307.6 lb

## 2021-02-18 DIAGNOSIS — E876 Hypokalemia: Secondary | ICD-10-CM | POA: Diagnosis not present

## 2021-02-18 DIAGNOSIS — I10 Essential (primary) hypertension: Secondary | ICD-10-CM | POA: Insufficient documentation

## 2021-02-18 DIAGNOSIS — Z01818 Encounter for other preprocedural examination: Secondary | ICD-10-CM | POA: Insufficient documentation

## 2021-02-18 DIAGNOSIS — D508 Other iron deficiency anemias: Secondary | ICD-10-CM | POA: Insufficient documentation

## 2021-02-18 LAB — BASIC METABOLIC PANEL WITH GFR
Anion gap: 9 (ref 5–15)
BUN: 14 mg/dL (ref 6–20)
CO2: 35 mmol/L — ABNORMAL HIGH (ref 22–32)
Calcium: 8.8 mg/dL — ABNORMAL LOW (ref 8.9–10.3)
Chloride: 94 mmol/L — ABNORMAL LOW (ref 98–111)
Creatinine, Ser: 0.67 mg/dL (ref 0.44–1.00)
GFR, Estimated: >60 mL/min
Glucose, Bld: 102 mg/dL — ABNORMAL HIGH (ref 70–99)
Potassium: 2.9 mmol/L — ABNORMAL LOW (ref 3.5–5.1)
Sodium: 138 mmol/L (ref 135–145)

## 2021-02-18 LAB — CBC
HCT: 37 % (ref 36.0–46.0)
Hemoglobin: 10.8 g/dL — ABNORMAL LOW (ref 12.0–15.0)
MCH: 23.4 pg — ABNORMAL LOW (ref 26.0–34.0)
MCHC: 29.2 g/dL — ABNORMAL LOW (ref 30.0–36.0)
MCV: 80.1 fL (ref 80.0–100.0)
Platelets: 341 10*3/uL (ref 150–400)
RBC: 4.62 MIL/uL (ref 3.87–5.11)
RDW: 16.7 % — ABNORMAL HIGH (ref 11.5–15.5)
WBC: 5.7 10*3/uL (ref 4.0–10.5)
nRBC: 0 % (ref 0.0–0.2)

## 2021-02-18 NOTE — Telephone Encounter (Signed)
Spoke with patient to follow up from new patient appt and assess navigation needs. Patient denies any questions or concerns at this time. Contact info given and encouraged her to call should anything arise. Patient verbalized understanding.

## 2021-02-18 NOTE — Pre-Procedure Instructions (Signed)
Surgical Instructions    Your procedure is scheduled on Tuesday, December 13th.  Report to [facility] Main Entrance "A" at 1:30 PM, then check in with the Admitting office.  Call this number if you have problems the morning of surgery:  [phone]   If you have any questions prior to your surgery date call [phone]: Open Monday-Friday 8am-4pm    Remember:  Do not eat after midnight the night before your surgery  You may drink clear liquids until 12:30 PM the day of your surgery.   Clear liquids allowed are: Water, Non-Citrus Juices (without pulp), Carbonated Beverages, Clear Tea, Black Coffee Only, and Gatorade  Patient Instructions  The night before surgery:  No food after midnight. ONLY clear liquids after midnight  The day of surgery (if you do NOT have diabetes):  Drink ONE (1) Pre-Surgery Clear Ensure by 12:30 PM the dayof surgery. Drink in one sitting. Do not sip.  This drink was given to you during your hospital  pre-op appointment visit.  Nothing else to drink after completing the  Pre-Surgery Clear Ensure.          If you have questions, please contact your surgeon's office.     Take these medicines the morning of surgery with A SIP OF WATER  ALPRAZolam (XANAX XR)  buPROPion (WELLBUTRIN XL) desvenlafaxine (PRISTIQ)  lamoTRIgine (LAMICTAL)    If needed: acetaminophen (TYLENOL) albuterol (PROVENTIL) (2.5 MG/3ML) 0.083% nebulizer solution albuterol (VENTOLIN HFA) 108 (90 Base) MCG/ACT inhaler- bring with you on day of surgery azelastine (ASTELIN) 0.1 % nasal spray cetirizine (ZYRTEC) olopatadine (PATADAY) 0.1 % ophthalmic solution valACYclovir (VALTREX)   As of today, STOP taking any Aspirin (unless otherwise instructed by your surgeon) Aleve, Naproxen, Ibuprofen, Motrin, Advil, Goody's, BC's, all herbal medications, fish oil, and all vitamins.                     Do NOT Smoke (Tobacco/Vaping) or drink Alcohol 24 hours prior to your procedure.  If  you use a CPAP at night, you may bring all equipment for your overnight stay.   Contacts, glasses, piercing's, hearing aid's, dentures or partials may not be worn into surgery, please bring cases for these belongings.    For patients admitted to the hospital, discharge time will be determined by your treatment team.   Patients discharged the day of surgery will not be allowed to drive home, and someone needs to stay with them for 24 hours.  NO VISITORS WILL BE ALLOWED IN PRE-OP WHERE PATIENTS GET READY FOR SURGERY.  ONLY 1 SUPPORT PERSON MAY BE PRESENT IN THE WAITING ROOM WHILE YOU ARE IN SURGERY.  IF YOU ARE TO BE ADMITTED, ONCE YOU ARE IN YOUR ROOM YOU WILL BE ALLOWED TWO (2) VISITORS.  Minor children may have two parents present. Special consideration for safety and communication needs will be reviewed on a case by case basis.   Special instructions:   [facility]- Preparing For Surgery  Before surgery, you can play an important role. Because skin is not sterile, your skin needs to be as free of germs as possible. You can reduce the number of germs on your skin by washing with CHG (chlorahexidine gluconate) Soap before surgery.  CHG is an antiseptic cleaner which kills germs and bonds with the skin to continue killing germs even after washing.    Oral Hygiene is also important to reduce your risk of infection.  Remember - BRUSH YOUR TEETH THE MORNING OF SURGERY WITH  YOUR REGULAR TOOTHPASTE  Please do not use if you have an allergy to CHG or antibacterial soaps. If your skin becomes reddened/irritated stop using the CHG.  Do not shave (including legs and underarms) for at least 48 hours prior to first CHG shower. It is OK to shave your face.  Please follow these instructions carefully.   Shower the NIGHT BEFORE SURGERY and the MORNING OF SURGERY  If you chose to wash your hair, wash your hair first as usual with your normal shampoo.  After you shampoo, rinse your hair and body thoroughly  to remove the shampoo.  Use CHG Soap as you would any other liquid soap. You can apply CHG directly to the skin and wash gently with a scrungie or a clean washcloth.   Apply the CHG Soap to your body ONLY FROM THE NECK DOWN.  Do not use on open wounds or open sores. Avoid contact with your eyes, ears, mouth and genitals (private parts). Wash Face and genitals (private parts)  with your normal soap.   Wash thoroughly, paying special attention to the area where your surgery will be performed.  Thoroughly rinse your body with warm water from the neck down.  DO NOT shower/wash with your normal soap after using and rinsing off the CHG Soap.  Pat yourself dry with a CLEAN TOWEL.  Wear CLEAN PAJAMAS to bed the night before surgery  Place CLEAN SHEETS on your bed the night before your surgery  DO NOT SLEEP WITH PETS.   Day of Surgery: Shower with CHG soap. Do not wear jewelry, make up, nail polish, gel polish, artificial nails, or any other type of covering on natural nails including finger and toenails. If patients have artificial nails, gel coating, etc. that need to be removed by a nail salon please have this removed prior to surgery. Surgery may need to be canceled/delayed if the surgeon/ anesthesia feels like the patient is unable to be adequately monitored. Do not wear lotions, powders, perfumes, or deodorant. Do not shave 48 hours prior to surgery.   Do not bring valuables to the hospital. [facility] is not responsible for any belongings or valuables. Wear Clean/Comfortable clothing the morning of surgery Remember to brush your teeth WITH YOUR REGULAR TOOTHPASTE.   Please read over the following fact sheets that you were given.   3 days prior to your procedure or After your COVID test   You are not required to quarantine however you are required to wear a well-fitting mask when you are out and around people not in your household. If your mask becomes wet or soiled, replace with  a new one.   Wash your hands often with soap and water for 20 seconds or clean your hands with an alcohol-based hand sanitizer that contains at least 60% alcohol.   Do not share personal items.   Notify your provider:  o if you are in close contact with someone who has COVID  o or if you develop a fever of 100.4 or greater, sneezing, cough, sore throat, shortness of breath or body aches.

## 2021-02-18 NOTE — Progress Notes (Signed)
PCP - Dr. Nada Boozer Jose-Mathews Cardiologist - denies  PPM/ICD - denies   Chest x-ray - 05/12/17 EKG - 02/18/21 at PAT Stress Test - denies ECHO - denies Cardiac Cath - denies  Sleep Study - pt states she had one about 3 years ago and was diagnosed with "mild-moderate" OSA and was told that she did not need a CPAP   DM- denies  Blood Thinner Instructions: n/a Aspirin Instructions: n/a  ERAS Protcol - yes PRE-SURGERY Ensure given at PAT  COVID TEST- n/a, ambulatory surgery   Anesthesia review: no  Patient denies shortness of breath, fever, cough and chest pain at PAT appointment   All instructions explained to the patient, with a verbal understanding of the material. Patient agrees to go over the instructions while at home for a better understanding. Patient also instructed to wear a mask in public for 3 days prior to surgery. The opportunity to ask questions was provided.

## 2021-02-19 NOTE — Progress Notes (Addendum)
Anesthesia Chart Review:  Preop labs notable for hypokalemia with potassium 2.9.  This result was called to Dr. Josetta Huddle office.  Will be rechecked day of surgery.  Addendum 02/20/21: Dr. Brantley Stage prescribed pt Kcl 20 mEq bid x 5 days.   Wynonia Musty [facility] Short Stay Center/Anesthesiology Phone [phone] 02/19/2021 3:40 PM

## 2021-02-24 ENCOUNTER — Encounter: Payer: Self-pay | Admitting: Internal Medicine

## 2021-02-24 ENCOUNTER — Ambulatory Visit
Admission: RE | Admit: 2021-02-24 | Discharge: 2021-02-24 | Disposition: A | Discharge status: Home / Self Care | Payer: BC Managed Care – PPO | Source: Ambulatory Visit | Attending: Surgery | Admitting: Surgery

## 2021-02-24 DIAGNOSIS — C50912 Malignant neoplasm of unspecified site of left female breast: Secondary | ICD-10-CM | POA: Diagnosis not present

## 2021-02-25 ENCOUNTER — Ambulatory Visit
Admission: RE | Admit: 2021-02-25 | Discharge: 2021-02-25 | Disposition: A | Discharge status: Home / Self Care | Payer: BC Managed Care – PPO | Source: Ambulatory Visit | Attending: Surgery | Admitting: Surgery

## 2021-02-25 ENCOUNTER — Encounter (HOSPITAL_COMMUNITY): Payer: Self-pay | Admitting: Surgery

## 2021-02-25 ENCOUNTER — Ambulatory Visit (HOSPITAL_COMMUNITY)
Admission: RE | Admit: 2021-02-25 | Discharge: 2021-02-25 | Disposition: A | Discharge status: Home / Self Care | Payer: BC Managed Care – PPO | Source: Ambulatory Visit | Attending: Surgery | Admitting: Surgery

## 2021-02-25 ENCOUNTER — Ambulatory Visit (HOSPITAL_COMMUNITY): Payer: BC Managed Care – PPO | Admitting: Physician Assistant

## 2021-02-25 ENCOUNTER — Encounter (HOSPITAL_COMMUNITY)
Admission: RE | Disposition: A | Discharge status: Home / Self Care | Payer: Self-pay | Source: Ambulatory Visit | Attending: Surgery

## 2021-02-25 DIAGNOSIS — R519 Headache, unspecified: Secondary | ICD-10-CM | POA: Insufficient documentation

## 2021-02-25 DIAGNOSIS — J45909 Unspecified asthma, uncomplicated: Secondary | ICD-10-CM | POA: Insufficient documentation

## 2021-02-25 DIAGNOSIS — F418 Other specified anxiety disorders: Secondary | ICD-10-CM | POA: Diagnosis not present

## 2021-02-25 DIAGNOSIS — G4730 Sleep apnea, unspecified: Secondary | ICD-10-CM | POA: Insufficient documentation

## 2021-02-25 DIAGNOSIS — C50912 Malignant neoplasm of unspecified site of left female breast: Secondary | ICD-10-CM | POA: Diagnosis not present

## 2021-02-25 DIAGNOSIS — C50412 Malignant neoplasm of upper-outer quadrant of left female breast: Secondary | ICD-10-CM | POA: Insufficient documentation

## 2021-02-25 DIAGNOSIS — Z170 Estrogen receptor positive status [ER+]: Secondary | ICD-10-CM | POA: Insufficient documentation

## 2021-02-25 DIAGNOSIS — R928 Other abnormal and inconclusive findings on diagnostic imaging of breast: Secondary | ICD-10-CM | POA: Diagnosis not present

## 2021-02-25 DIAGNOSIS — D508 Other iron deficiency anemias: Secondary | ICD-10-CM | POA: Diagnosis not present

## 2021-02-25 DIAGNOSIS — C50212 Malignant neoplasm of upper-inner quadrant of left female breast: Secondary | ICD-10-CM | POA: Diagnosis not present

## 2021-02-25 DIAGNOSIS — I10 Essential (primary) hypertension: Secondary | ICD-10-CM | POA: Diagnosis not present

## 2021-02-25 DIAGNOSIS — M1990 Unspecified osteoarthritis, unspecified site: Secondary | ICD-10-CM | POA: Insufficient documentation

## 2021-02-25 DIAGNOSIS — Z8711 Personal history of peptic ulcer disease: Secondary | ICD-10-CM | POA: Diagnosis not present

## 2021-02-25 HISTORY — PX: BREAST LUMPECTOMY WITH RADIOACTIVE SEED AND SENTINEL LYMPH NODE BIOPSY: SHX6550

## 2021-02-25 LAB — POCT I-STAT, CHEM 8
BUN: 7 mg/dL (ref 6–20)
Calcium, Ion: 1.06 mmol/L — ABNORMAL LOW (ref 1.15–1.40)
Chloride: 102 mmol/L (ref 98–111)
Creatinine, Ser: 0.6 mg/dL (ref 0.44–1.00)
Glucose, Bld: 96 mg/dL (ref 70–99)
HCT: 40 % (ref 36.0–46.0)
Hemoglobin: 13.6 g/dL (ref 12.0–15.0)
Potassium: 4.3 mmol/L (ref 3.5–5.1)
Sodium: 140 mmol/L (ref 135–145)
TCO2: 28 mmol/L (ref 22–32)

## 2021-02-25 SURGERY — BREAST LUMPECTOMY WITH RADIOACTIVE SEED AND SENTINEL LYMPH NODE BIOPSY
Anesthesia: General | Site: Breast | Laterality: Left

## 2021-02-25 MED ORDER — OXYCODONE HCL 5 MG PO TABS: 5.0000 mg | ORAL_TABLET | Freq: Four times a day (QID) | ORAL | 0 refills | Status: DC | PRN

## 2021-02-25 MED ORDER — CHLORHEXIDINE GLUCONATE 0.12 % MT SOLN: 15.0000 mL | Freq: Once | OROMUCOSAL | Status: AC

## 2021-02-25 MED ORDER — HEMOSTATIC AGENTS (NO CHARGE) OPTIME
TOPICAL | Status: DC | PRN
  Administered 2021-02-25: 1 via TOPICAL

## 2021-02-25 MED ORDER — CEFAZOLIN IN SODIUM CHLORIDE 3-0.9 GM/100ML-% IV SOLN
3.0000 g | INTRAVENOUS | Status: AC
  Administered 2021-02-25: 3 g via INTRAVENOUS

## 2021-02-25 MED ORDER — PHENYLEPHRINE 40 MCG/ML (10ML) SYRINGE FOR IV PUSH (FOR BLOOD PRESSURE SUPPORT)
PREFILLED_SYRINGE | INTRAVENOUS | Status: DC | PRN
  Administered 2021-02-25: 80 ug via INTRAVENOUS

## 2021-02-25 MED ORDER — PHENYLEPHRINE HCL-NACL 20-0.9 MG/250ML-% IV SOLN
INTRAVENOUS | Status: DC | PRN
Start: 2021-02-25 — End: 2021-02-25
  Administered 2021-02-25: 40 ug/min via INTRAVENOUS

## 2021-02-25 MED ORDER — FENTANYL CITRATE (PF) 250 MCG/5ML IJ SOLN
INTRAMUSCULAR | Status: DC | PRN
  Administered 2021-02-25: 50 ug via INTRAVENOUS
  Administered 2021-02-25: 25 ug via INTRAVENOUS
  Administered 2021-02-25 (×2): 50 ug via INTRAVENOUS
  Administered 2021-02-25: 25 ug via INTRAVENOUS

## 2021-02-25 MED ORDER — DEXAMETHASONE SODIUM PHOSPHATE 10 MG/ML IJ SOLN
INTRAMUSCULAR | Status: DC | PRN
  Administered 2021-02-25: 5 mg via INTRAVENOUS

## 2021-02-25 MED ORDER — CEFAZOLIN IN SODIUM CHLORIDE 3-0.9 GM/100ML-% IV SOLN
INTRAVENOUS | Status: AC
  Filled 2021-02-25: qty 100

## 2021-02-25 MED ORDER — LIDOCAINE 2% (20 MG/ML) 5 ML SYRINGE
INTRAMUSCULAR | Status: DC | PRN
  Administered 2021-02-25: 100 mg via INTRAVENOUS

## 2021-02-25 MED ORDER — MIDAZOLAM HCL 2 MG/2ML IJ SOLN
INTRAMUSCULAR | Status: AC
  Filled 2021-02-25: qty 2

## 2021-02-25 MED ORDER — PROPOFOL 10 MG/ML IV BOLUS
INTRAVENOUS | Status: DC | PRN
  Administered 2021-02-25: 200 mg via INTRAVENOUS

## 2021-02-25 MED ORDER — ONDANSETRON HCL 4 MG PO TABS: 4.0000 mg | ORAL_TABLET | Freq: Every day | ORAL | 1 refills | Status: DC | PRN

## 2021-02-25 MED ORDER — MIDAZOLAM HCL 2 MG/2ML IJ SOLN
INTRAMUSCULAR | Status: DC | PRN
  Administered 2021-02-25: 2 mg via INTRAVENOUS

## 2021-02-25 MED ORDER — BUPIVACAINE-EPINEPHRINE 0.25% -1:200000 IJ SOLN
INTRAMUSCULAR | Status: DC | PRN
  Administered 2021-02-25: 24 mL

## 2021-02-25 MED ORDER — LACTATED RINGERS IV SOLN: INTRAVENOUS | Status: DC

## 2021-02-25 MED ORDER — 0.9 % SODIUM CHLORIDE (POUR BTL) OPTIME
TOPICAL | Status: DC | PRN
  Administered 2021-02-25: 1000 mL

## 2021-02-25 MED ORDER — FENTANYL CITRATE (PF) 100 MCG/2ML IJ SOLN
25.0000 ug | INTRAMUSCULAR | Status: DC | PRN
  Administered 2021-02-25: 50 ug via INTRAVENOUS

## 2021-02-25 MED ORDER — MAGTRACE LYMPHATIC TRACER
INTRAMUSCULAR | Status: DC | PRN
  Administered 2021-02-25: 2 mL via INTRAMUSCULAR

## 2021-02-25 MED ORDER — ACETAMINOPHEN 500 MG PO TABS: 1000.0000 mg | ORAL_TABLET | ORAL | Status: DC

## 2021-02-25 MED ORDER — ORAL CARE MOUTH RINSE: 15.0000 mL | Freq: Once | OROMUCOSAL | Status: AC

## 2021-02-25 MED ORDER — PROPOFOL 10 MG/ML IV BOLUS
INTRAVENOUS | Status: AC
  Filled 2021-02-25: qty 20

## 2021-02-25 MED ORDER — EPHEDRINE SULFATE-NACL 50-0.9 MG/10ML-% IV SOSY
PREFILLED_SYRINGE | INTRAVENOUS | Status: DC | PRN
  Administered 2021-02-25: 5 mg via INTRAVENOUS
  Administered 2021-02-25 (×4): 10 mg via INTRAVENOUS

## 2021-02-25 MED ORDER — ACETAMINOPHEN 500 MG PO TABS
ORAL_TABLET | ORAL | Status: AC
  Filled 2021-02-25: qty 2

## 2021-02-25 MED ORDER — ONDANSETRON HCL 4 MG/2ML IJ SOLN
INTRAMUSCULAR | Status: DC | PRN
  Administered 2021-02-25: 4 mg via INTRAVENOUS

## 2021-02-25 MED ORDER — FENTANYL CITRATE (PF) 250 MCG/5ML IJ SOLN
INTRAMUSCULAR | Status: AC
  Filled 2021-02-25: qty 5

## 2021-02-25 MED ORDER — FENTANYL CITRATE (PF) 100 MCG/2ML IJ SOLN
INTRAMUSCULAR | Status: AC
  Filled 2021-02-25: qty 2

## 2021-02-25 MED ORDER — CHLORHEXIDINE GLUCONATE CLOTH 2 % EX PADS: 6.0000 | MEDICATED_PAD | Freq: Once | CUTANEOUS | Status: DC

## 2021-02-25 MED ORDER — CHLORHEXIDINE GLUCONATE 0.12 % MT SOLN
OROMUCOSAL | Status: AC
  Administered 2021-02-25: 15 mL via OROMUCOSAL
  Filled 2021-02-25: qty 15

## 2021-02-25 MED ORDER — BUPIVACAINE-EPINEPHRINE (PF) 0.25% -1:200000 IJ SOLN
INTRAMUSCULAR | Status: AC
  Filled 2021-02-25: qty 30

## 2021-02-25 SURGICAL SUPPLY — 45 items
ADH SKN CLS APL DERMABOND .7 (GAUZE/BANDAGES/DRESSINGS) ×1
APL PRP STRL LF DISP 70% ISPRP (MISCELLANEOUS) ×1
APPLIER CLIP 9.375 MED OPEN (MISCELLANEOUS) ×3
APR CLP MED 9.3 20 MLT OPN (MISCELLANEOUS) ×1
BINDER BREAST XLRG (GAUZE/BANDAGES/DRESSINGS) IMPLANT
CANISTER SUCT 3000ML PPV (MISCELLANEOUS) ×4 IMPLANT
CHLORAPREP W/TINT 26 (MISCELLANEOUS) ×4 IMPLANT
CLIP APPLIE 9.375 MED OPEN (MISCELLANEOUS) ×1 IMPLANT
CNTNR URN SCR LID CUP LEK RST (MISCELLANEOUS) ×1 IMPLANT
CONT SPEC 4OZ STRL OR WHT (MISCELLANEOUS) ×3
COVER PROBE W GEL 5X96 (DRAPES) ×4 IMPLANT
COVER SURGICAL LIGHT HANDLE (MISCELLANEOUS) ×4 IMPLANT
DERMABOND ADVANCED (GAUZE/BANDAGES/DRESSINGS) ×2
DERMABOND ADVANCED .7 DNX12 (GAUZE/BANDAGES/DRESSINGS) ×2 IMPLANT
DEVICE DUBIN SPECIMEN MAMMOGRA (MISCELLANEOUS) ×3 IMPLANT
DRAPE CHEST BREAST 15X10 FENES (DRAPES) ×3 IMPLANT
ELECT CAUTERY BLADE 6.4 (BLADE) ×4 IMPLANT
ELECT REM PT RETURN 9FT ADLT (ELECTROSURGICAL) ×3
ELECTRODE REM PT RTRN 9FT ADLT (ELECTROSURGICAL) ×1 IMPLANT
GLOVE SRG 8 PF TXTR STRL LF DI (GLOVE) ×2 IMPLANT
GLOVE SURG ENC MOIS LTX SZ8 (GLOVE) ×4 IMPLANT
GLOVE SURG UNDER POLY LF SZ8 (GLOVE) ×3
GOWN STRL REUS W/ TWL LRG LVL3 (GOWN DISPOSABLE) ×2 IMPLANT
GOWN STRL REUS W/ TWL XL LVL3 (GOWN DISPOSABLE) ×2 IMPLANT
GOWN STRL REUS W/TWL LRG LVL3 (GOWN DISPOSABLE) ×3
GOWN STRL REUS W/TWL XL LVL3 (GOWN DISPOSABLE) ×3
HEMOSTAT ARISTA ABSORB 3G PWDR (HEMOSTASIS) ×2 IMPLANT
KIT BASIN OR (CUSTOM PROCEDURE TRAY) ×4 IMPLANT
KIT MARKER MARGIN INK (KITS) ×3 IMPLANT
LIGHT WAVEGUIDE WIDE FLAT (MISCELLANEOUS) IMPLANT
NDL 18GX1X1/2 (RX/OR ONLY) (NEEDLE) IMPLANT
NDL FILTER BLUNT 18X1 1/2 (NEEDLE) IMPLANT
NDL HYPO 25GX1X1/2 BEV (NEEDLE) ×2 IMPLANT
NEEDLE 18GX1X1/2 (RX/OR ONLY) (NEEDLE) ×3 IMPLANT
NEEDLE FILTER BLUNT 18X 1/2SAF (NEEDLE)
NEEDLE FILTER BLUNT 18X1 1/2 (NEEDLE) IMPLANT
NEEDLE HYPO 25GX1X1/2 BEV (NEEDLE) ×3 IMPLANT
NS IRRIG 1000ML POUR BTL (IV SOLUTION) ×4 IMPLANT
PACK GENERAL/GYN (CUSTOM PROCEDURE TRAY) ×4 IMPLANT
SUT MNCRL AB 4-0 PS2 18 (SUTURE) ×4 IMPLANT
SUT VIC AB 3-0 SH 18 (SUTURE) ×6 IMPLANT
SYR CONTROL 10ML LL (SYRINGE) ×4 IMPLANT
TOWEL GREEN STERILE (TOWEL DISPOSABLE) ×3 IMPLANT
TOWEL GREEN STERILE FF (TOWEL DISPOSABLE) ×3 IMPLANT
TRACER MAGTRACE VIAL (MISCELLANEOUS) ×2 IMPLANT

## 2021-02-25 NOTE — Interval H&P Note (Signed)
History and Physical Interval Note:  02/25/2021 2:59 PM  Tracy [name]  has presented today for surgery, with the diagnosis of LEFT BREAST CANCER.  The various methods of treatment have been discussed with the patient and family. After consideration of risks, benefits and other options for treatment, the patient has consented to  Procedure(s): LEFT BREAST LUMPECTOMY WITH RADIOACTIVE SEED AND SENTINEL LYMPH NODE BIOPSY (Left) as a surgical intervention.  The patient's history has been reviewed, patient examined, no change in status, stable for surgery.  I have reviewed the patient's chart and labs.  Questions were answered to the patient's satisfaction.     [name]

## 2021-02-25 NOTE — Discharge Instructions (Signed)
[facility] Office Phone Number [phone]  BREAST BIOPSY/ PARTIAL MASTECTOMY: POST OP INSTRUCTIONS  Always review your discharge instruction sheet given to you by the facility where your surgery was performed.  IF YOU HAVE DISABILITY OR FAMILY LEAVE FORMS, YOU MUST BRING THEM TO THE OFFICE FOR PROCESSING.  DO NOT GIVE THEM TO YOUR DOCTOR.  A prescription for pain medication may be given to you upon discharge.  Take your pain medication as prescribed, if needed.  If narcotic pain medicine is not needed, then you may take acetaminophen (Tylenol) or ibuprofen (Advil) as needed. Take your usually prescribed medications unless otherwise directed If you need a refill on your pain medication, please contact your pharmacy.  They will contact our office to request authorization.  Prescriptions will not be filled after 5pm or on week-ends. You should eat very light the first 24 hours after surgery, such as soup, crackers, pudding, etc.  Resume your normal diet the day after surgery. Most patients will experience some swelling and bruising in the breast.  Ice packs and a good support bra will help.  Swelling and bruising can take several days to resolve.  It is common to experience some constipation if taking pain medication after surgery.  Increasing fluid intake and taking a stool softener will usually help or prevent this problem from occurring.  A mild laxative (Milk of Magnesia or Miralax) should be taken according to package directions if there are no bowel movements after 48 hours. Unless discharge instructions indicate otherwise, you may remove your bandages 24-48 hours after surgery, and you may shower at that time.  You may have steri-strips (small skin tapes) in place directly over the incision.  These strips should be left on the skin for 7-10 days.  If your surgeon used skin glue on the incision, you may shower in 24 hours.  The glue will flake off over the next 2-3 weeks.  Any  sutures or staples will be removed at the office during your follow-up visit. ACTIVITIES:  You may resume regular daily activities (gradually increasing) beginning the next day.  Wearing a good support bra or sports bra minimizes pain and swelling.  You may have sexual intercourse when it is comfortable. You may drive when you no longer are taking prescription pain medication, you can comfortably wear a seatbelt, and you can safely maneuver your car and apply brakes. RETURN TO WORK:  ______________________________________________________________________________________ You should see your doctor in the office for a follow-up appointment approximately two weeks after your surgery.  Your doctor's nurse will typically make your follow-up appointment when she calls you with your pathology report.  Expect your pathology report 2-3 business days after your surgery.  You may call to check if you do not hear from us after three days. OTHER INSTRUCTIONS: _______________________________________________________________________________________________ _____________________________________________________________________________________________________________________________________ _____________________________________________________________________________________________________________________________________ _____________________________________________________________________________________________________________________________________  WHEN TO CALL YOUR DOCTOR: Fever over 101.0 Nausea and/or vomiting. Extreme swelling or bruising. Continued bleeding from incision. Increased pain, redness, or drainage from the incision.  The clinic staff is available to answer your questions during regular business hours.  Please don't hesitate to call and ask to speak to one of the nurses for clinical concerns.  If you have a medical emergency, go to the nearest emergency room or call 911.  A surgeon from [facility] is always on call at the hospital.  For further questions, please visit [email or web address]   

## 2021-02-25 NOTE — Anesthesia Procedure Notes (Signed)
Procedure Name: LMA Insertion Date/Time: 02/25/2021 3:13 PM Performed by: Oletta Lamas, CRNA Pre-anesthesia Checklist: Patient identified, Emergency Drugs available, Suction available and Patient being monitored Patient Re-evaluated:Patient Re-evaluated prior to induction Oxygen Delivery Method: Circle System Utilized Preoxygenation: Pre-oxygenation with 100% oxygen Induction Type: IV induction Ventilation: Mask ventilation without difficulty LMA: LMA inserted LMA Size: 4.0 Number of attempts: 1 Placement Confirmation: positive ETCO2 Tube secured with: Tape Dental Injury: Teeth and Oropharynx as per pre-operative assessment

## 2021-02-25 NOTE — Transfer of Care (Signed)
Immediate Anesthesia Transfer of Care Note  Patient: Tracy [name]  Procedure(s) Performed: LEFT BREAST LUMPECTOMY WITH RADIOACTIVE SEED AND SENTINEL LYMPH NODE BIOPSY (Left: Breast)  Patient Location: PACU  Anesthesia Type:General  Level of Consciousness: awake, alert , oriented and patient cooperative  Airway & Oxygen Therapy: Patient Spontanous Breathing and Patient connected to nasal cannula oxygen  Post-op Assessment: Report given to RN and Post -op Vital signs reviewed and stable  Post vital signs: Reviewed and stable  Last Vitals:  Vitals Value Taken Time  BP 107/83 02/25/21 1710  Temp 36.3 C 02/25/21 1710  Pulse 86 02/25/21 1723  Resp 17 02/25/21 1723  SpO2 92 % 02/25/21 1723  Vitals shown include unvalidated device data.  Last Pain:  Vitals:   02/25/21 1403  TempSrc:   PainSc: 0-No pain      Patients Stated Pain Goal: 0 (98/26/41 5830)  Complications: No notable events documented.

## 2021-02-25 NOTE — Anesthesia Postprocedure Evaluation (Signed)
Anesthesia Post Note  Patient: Tracy [name]  Procedure(s) Performed: LEFT BREAST LUMPECTOMY WITH RADIOACTIVE SEED AND SENTINEL LYMPH NODE BIOPSY (Left: Breast)     Patient location during evaluation: PACU Anesthesia Type: General Level of consciousness: awake Pain management: pain level controlled Vital Signs Assessment: post-procedure vital signs reviewed and stable Respiratory status: spontaneous breathing Cardiovascular status: stable Postop Assessment: no apparent nausea or vomiting Anesthetic complications: no   No notable events documented.  Last Vitals:  Vitals:   02/25/21 1725 02/25/21 1741  BP: 103/76 104/70  Pulse: 85 81  Resp: 16 12  Temp:  36.4 C  SpO2: 95% 98%    Last Pain:  Vitals:   02/25/21 1741  TempSrc:   PainSc: 6                  Jullianna Gabor

## 2021-02-25 NOTE — Anesthesia Preprocedure Evaluation (Addendum)
Anesthesia Evaluation  Patient identified by MRN, date of birth, ID band Patient awake    Reviewed: Allergy & Precautions, NPO status , Patient's Chart, lab work & pertinent test results  Airway Mallampati: II  TM Distance: >3 FB     Dental   Pulmonary asthma , sleep apnea ,    breath sounds clear to auscultation       Cardiovascular hypertension, + Valvular Problems/Murmurs  Rhythm:Regular Rate:Normal     Neuro/Psych  Headaches,    GI/Hepatic Neg liver ROS, PUD,   Endo/Other  negative endocrine ROS  Renal/GU negative Renal ROS     Musculoskeletal  (+) Arthritis ,   Abdominal   Peds  Hematology   Anesthesia Other Findings   Reproductive/Obstetrics                             Anesthesia Physical Anesthesia Plan  ASA: 3  Anesthesia Plan: General   Post-op Pain Management:    Induction: Intravenous  PONV Risk Score and Plan: 3 and Ondansetron, Dexamethasone and Midazolam  Airway Management Planned: Oral ETT  Additional Equipment:   Intra-op Plan:   Post-operative Plan:   Informed Consent: I have reviewed the patients History and Physical, chart, labs and discussed the procedure including the risks, benefits and alternatives for the proposed anesthesia with the patient or authorized representative who has indicated his/her understanding and acceptance.     Dental advisory given  Plan Discussed with: CRNA and Anesthesiologist  Anesthesia Plan Comments:         Anesthesia Quick Evaluation

## 2021-02-25 NOTE — Op Note (Signed)
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  This was inked.  All margins excised and inked as well.  2 left axillary sentinel node sent to pathology which were hot background counts approached baseline  EBL: 50 cc  Indications for procedure: The patient is a 55 year old female with stage I left breast cancer.  She chose breast conserving surgery after being seen in the multidisciplinary clinic by all specialist.The procedure has been discussed with the patient. Alternatives to surgery have been discussed with the patient.  Risks of surgery include bleeding,  Infection,  Seroma formation, death,  and the need for further surgery.   The patient understands and wishes to proceed. Sentinel lymph node mapping and dissection has been discussed with the patient.  Risk of bleeding,  Infection,  Seroma formation,  Additional procedures,,  Shoulder weakness ,  Shoulder stiffness,  Nerve and blood vessel injury and reaction to the mapping dyes have been discussed.  Alternatives to surgery have been discussed with the patient.  The patient agrees to proceed.    Scription of procedure: The patient was met in holding area.  Left breast was marked as correct site and films available for review.  She underwent a pectoral block procedure protocol.  All questions were answered.  She was taken back to the operating.  She was placed supine upon the OR table.  After duction of general seizure, a timeout was performed under sterile conditions the left breast was injected in a subareolar position with 4 cc of mag trace.  Massage was done for 5 minutes.  She  was then prepped and draped in a sterile fashion a second timeout performed.  Per patient, site and procedure verified.  Films were available for review.  She received appropriate preoperative antibiotics.  Neoprobe used to identify the seed left breast upper outer quadrant.  Incision made of this area after infiltration skin with local anesthetic.  All tissue around the seed and clip were excised with grossly negative margins.  Faxitron revealed the seed and clip to be in the specimen.  All margins were then excised around this and sent separately.  Hemostasis was achieved with cautery.  Irrigation was used.  Clips placed in the cavity.  Cavity closed with 3-0 Vicryl and 4 Monocryl used to close skin.  The MAC trace was then used to localize the left axillary sentinel nodes using the probe.  Incision made in the left axilla.  Dissection was carried down.  Mag trace probe was used identify 2 hot nodes and had these removed in the level 1 basin which were deep.  The third specimen left lymphatic plate was excised.  Background counts approached 0.  Hemostasis achieved with cautery pressure and Arista.  Hemostasis was excellent.  The long thoracic nerve, thoracodorsal trunk and axillary vein were all preserved.  We then closed the deep layers with 3-0 Vicryl.  4 Monocryl was used to close the skin in a subcuticular fashion.  Dermabond applied.  Breast binder placed.  All counts found to be correct.  The patient was awoke extubated taken to recovery in satisfactory condition.

## 2021-02-25 NOTE — H&P (Signed)
History of Present Illness: Tracy [name] is a 55 y.o. female who is seen today as an office consultation at the request of Dr. Philis Pique for evaluation of Breast Cancer .   Patient denies any history of breast pain, breast mass or nipple discharge. 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 No breast cancer history. She has no complaints today.  Review of Systems: A complete review of systems was obtained from the patient. I have reviewed this information and discussed as appropriate with the patient. See HPI as well for other ROS.    Medical History: Past Medical History:  Diagnosis Date   Anemia 01/09/2013   Anxiety and depression   Asthma   BMI 45.0-49.9, adult (CMS-HCC) 01/09/2016   Chronic migraine 02/12/2016   Depression   Diverticulosis 10/02/2013   Heart murmur, unspecified   History of cancer   Hypertension   Low vitamin B12 level 06/11/2015   Melasma 09/12/2013   Perforated ulcer (CMS-HCC)   Primary insomnia 01/09/2016   PUD (peptic ulcer disease)   Patient Active Problem List  Diagnosis   PUD (peptic ulcer disease) - has seen Dr. Malissa Hippo   Asthma   Anxiety and depression   Iron deficiency anemia due to chronic blood loss: sees Dr. Jacinto Reap at [facility] in [place]   Cardiac murmur   Diverticulosis   History of cold sores   Vitamin B12 deficiency   Fatty liver   Class 3 severe obesity due to excess calories with serious comorbidity and body mass index (BMI) of 50.0 to 59.9 in adult (CMS-HCC)   Primary insomnia   Chronic migraine   Primary osteoarthritis of left knee   Primary osteoarthritis of right knee   Heavy alcohol use   Essential hypertension   Past Surgical History:  Procedure Laterality Date   CHOLECYSTECTOMY   ENDOMETRIAL ABLATION W/ NOVASURE   GASTRIC BYPASS OPEN   HYSTERECTOMY    perferrated ulcer   TONSILLECTOMY    Allergies  Allergen Reactions   Amitriptyline Other (See Comments)  Behavior changes   Doxepin Other (See Comments)  sweats   Ibuprofen Other (See Comments)  Perforated ulcer   Morphine Hives   Nsaids (Non-Steroidal Anti-Inflammatory Drug) Other (See Comments)  H/o ulcer   Tramadol Nausea   Trazodone Other (See Comments)  Didn't work for sleep Didn't work for sleep Causes headaches   Current Outpatient Medications on File Prior to Visit  Medication Sig Dispense Refill   acetaminophen (TYLENOL) 500 MG tablet Take by mouth.   acetaminophen with codeine (TYLENOL-CODEINE #3 ORAL) Take by mouth   albuterol 90 mcg/actuation inhaler INHALE 2 INHALATIONS INTO THE LUNGS EVERY 4 HOURS AS NEEDED FOR WHEEZING 6.7 g 1   ALPRAZolam (XANAX) 0.5 MG tablet   azelastine (ASTELIN) 137 mcg nasal spray Place 1-2 sprays into both nostrils once daily   buPROPion (WELLBUTRIN XL) 300 MG XL tablet TK 1 T PO QAM   cetirizine (ZYRTEC) 10 mg capsule Take 10 mg by mouth once daily Reported on 07/29/2015   cyanocobalamin (VITAMIN B12) 1,000 mcg/mL injection   desvenlafaxine succinate (PRISTIQ) 50 MG ER tablet TK 1 T PO QD   ergocalciferol, vitamin D2, 1,250 mcg (50,000 unit) capsule Take 1 capsule (50,000 Units total) by mouth once a week for 30 days 4 capsule 2   fluticasone propionate (FLONASE)  50 mcg/actuation nasal spray Place 1-2 sprays into both nostrils once daily 11   gabapentin (NEURONTIN) 100 MG capsule Take 1 capsule by mouth once daily 3   hydroCHLOROthiazide (HYDRODIURIL) 12.5 MG tablet TAKE 1 TABLET(12.5 MG) BY MOUTH EVERY DAY 30 tablet 11   ibuprofen (MOTRIN) 800 MG tablet   lamoTRIgine (LAMICTAL) 100 MG tablet Take 100 mg by mouth 2 (two) times daily   methylphenidate HCl (RITALIN) 10 MG tablet Take 10 mg by mouth 2 (two) times daily   nebulizer and compressor [employer] accessories. Use neb q4 prn 1 each 0   pantoprazole (PROTONIX) 20 MG DR tablet Take 1  tablet (20 mg total) by mouth 2 (two) times daily before meals 14 tablet 0   PREVIDENT 5000 SENSITIVE 1.1-5 % use as directed by prescriber 0   SPRAVATO 84 mg (28 mg x 3) Spry   triamcinolone 0.1 % cream Apply topically 2 (two) times daily 30 g 1   valACYclovir (VALTREX) 1000 MG tablet Take 2 tablets (2,000 mg total) by mouth 2 (two) times daily 20 tablet 11   Current Facility-Administered Medications on File Prior to Visit  Medication Dose Route Frequency Provider Last Rate Last Admin   cyanocobalamin (VITAMIN B12) injection 1,000 mcg 1,000 mcg Intramuscular Q30 Days Jose-Mathews, Jessnie, MD 1,000 mcg at 12/02/20 1539   Family History  Problem Relation Age of Onset   COPD Mother   Anxiety Mother   Depression Mother   Diabetes type II Mother   Obesity Mother   Crohn's disease Mother   Coronary Artery Disease (Blocked arteries around heart) Father 91   Diabetes type II Father   High blood pressure (Hypertension) Father   Obesity Sister   Breast cancer Maternal Aunt 66   Depression Maternal Grandmother   Obesity Maternal Grandmother   Cancer Maternal Grandmother  stomach   Coronary Artery Disease (Blocked arteries around heart) Maternal Grandfather   Coronary Artery Disease (Blocked arteries around heart) Paternal Grandmother   Glaucoma Paternal Grandmother   Hip fracture Paternal Grandmother   High blood pressure (Hypertension) Paternal Grandmother   Osteoarthritis Paternal Grandmother   Skin cancer Paternal Grandmother   Stroke Paternal Grandmother   Alcohol abuse Paternal Grandfather   Leukemia Paternal Grandfather   Pancreatic cancer Sister   Breast cancer Maternal Aunt 84   No Known Problems Brother   No Known Problems Brother   No Known Problems Maternal Uncle   Lupus Neg Hx   Rheum arthritis Neg Hx   Gout Neg Hx    Social History   Tobacco Use  Smoking Status Never  Smokeless Tobacco Never  Tobacco Comments  nanny    Social History   Socioeconomic  History   Marital status: Married  Tobacco Use   Smoking status: Never   Smokeless tobacco: Never   Tobacco comments:  nanny  Vaping Use   Vaping Use: Never used  Substance and Sexual Activity   Alcohol use: No  Alcohol/week: 0.0 standard drinks   Drug use: No   Sexual activity: Yes  Partners: Male  Birth control/protection: Surgical  Social History Narrative  Married in 2007. Job: Surveyor, minerals. Exercise: walks with kids. Wears seatbelt, sunscreen. Dentist: yes. Calcium in diet: yes. Religious beliefs affecting health care: none.   Advanced Directives: will talk with hubby.   Objective:   Vitals:  02/03/21 1011  BP: (!) 150/80  Pulse: 97  Weight: (!) 140.3 kg (309 lb 3.2 oz)  Height: 165.1 cm ('5\' 5"' )   Body  mass index is 51.45 kg/m.  Physical Exam Constitutional:  Appearance: Normal appearance.  HENT:  Head: Normocephalic.  Eyes:  General: No scleral icterus. Pupils: Pupils are equal, round, and reactive to light.  Cardiovascular:  Rate and Rhythm: Normal rate.  Pulmonary:  Effort: Pulmonary effort is normal.  Breath sounds: No stridor.  Chest:  Breasts: Right: Normal. No swelling.  Left: Normal. No swelling.  Musculoskeletal:  Cervical back: Normal range of motion.  Lymphadenopathy:  Upper Body:  Right upper body: No supraclavicular or axillary adenopathy.  Left upper body: No supraclavicular or axillary adenopathy.  Neurological:  Mental Status: She is alert.     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 Based on the biopsy, the carcinoma appears Nottingham grade 2 of 3 and measures 0.4 cm in greatest linear extent. E-cadherin and prognostic markers  (ER/PR/ki-67/HER2)are pending and will be reported in an addendum. Dr. Thornell Sartorius reviewed the case and agrees with the above diagnosis. These results were called to [facility] on January 24, 2021. Thressa Sheller MD Pathologist, Electronic Signature (Case signed 01/24/2021)  CLINICAL DATA: 55 year old female recalled from screening mammogram dated 01/02/2021 for possible left breast distortion.   EXAM: DIGITAL DIAGNOSTIC UNILATERAL LEFT MAMMOGRAM WITH TOMOSYNTHESIS AND CAD; ULTRASOUND LEFT BREAST LIMITED   TECHNIQUE: Left digital diagnostic mammography and breast tomosynthesis was performed. The images were evaluated with computer-aided detection.; Targeted ultrasound examination of the left breast was performed.   COMPARISON: Previous exam(s).   ACR Breast Density Category b: There are scattered areas of fibroglandular density.   FINDINGS: There is focal persistent distortion in the superior central left breast at mid depth. Further evaluation with ultrasound was performed.   Targeted ultrasound is performed, showing an irregular, hypoechoic mass with posterior shadowing at the 11 o'clock position 6 cm from the nipple. It measures 7 x 6 x 6 mm. There is associated vascularity. Evaluation of the left axilla demonstrates no suspicious lymphadenopathy.   IMPRESSION: 1. Suspicious left breast mass corresponding with the screening mammographic findings. 2. No suspicious left axillary lymphadenopathy.   RECOMMENDATION: Ultrasound-guided biopsy of the left breast.   I have discussed the findings and recommendations with the patient. If applicable, a reminder letter will be sent to the patient regarding the next appointment.   BI-RADS CATEGORY 4: Suspicious.     Electronically Signed By: Kristopher Oppenheim M.D. On: 01/15/2021 15:06  Assessment and Plan:  Diagnoses and all orders for this visit:  Malignant neoplasm of upper-inner quadrant of left breast in  female, estrogen receptor positive (CMS-HCC)  Essential hypertension - Ambulatory Referral for Return Appointment with PCP for Uncontrolled HTN    Long discussion about breast cancer treatments. Discussed breast conserving surgery, radiation therapy and chemotherapy. Discussed mastectomy and reconstruction. She is opted for left breast seed localized lumpectomy with sentinel lymph node mapping. Discussed pros and cons of breast conserving surgery, local regional recurrence rates, complications, and lymphedema rates. Discussed mastectomy reconstruction as well. She is opted for left breast seed localized lumpectomy with left axillary sentinel lymph node mapping.The procedure has been discussed with the patient. Alternatives to surgery have been discussed with the patient. Risks of surgery include bleeding, lymphedema, cosmetic deformity, arm pain, shoulder pain, need for reexcision, infection, Seroma formation, death, and the  need for further surgery. The patient understands and wishes to proceed. Refer to medical and radiation oncology

## 2021-02-26 ENCOUNTER — Encounter (HOSPITAL_COMMUNITY): Payer: Self-pay | Admitting: Surgery

## 2021-02-27 LAB — SURGICAL PATHOLOGY

## 2021-02-28 ENCOUNTER — Encounter: Payer: Self-pay | Admitting: Surgery

## 2021-03-03 ENCOUNTER — Telehealth: Payer: Self-pay | Admitting: *Deleted

## 2021-03-03 ENCOUNTER — Encounter: Payer: Self-pay | Admitting: *Deleted

## 2021-03-03 DIAGNOSIS — F332 Major depressive disorder, recurrent severe without psychotic features: Secondary | ICD-10-CM | POA: Diagnosis not present

## 2021-03-03 NOTE — Telephone Encounter (Signed)
Received order for oncotype testing. Requisition faxed to [facility] and pathology.

## 2021-03-08 DIAGNOSIS — R059 Cough, unspecified: Secondary | ICD-10-CM | POA: Diagnosis not present

## 2021-03-08 DIAGNOSIS — U071 COVID-19: Secondary | ICD-10-CM | POA: Diagnosis not present

## 2021-03-11 ENCOUNTER — Telehealth: Payer: Self-pay | Admitting: *Deleted

## 2021-03-11 ENCOUNTER — Telehealth: Payer: Self-pay

## 2021-03-11 DIAGNOSIS — J22 Unspecified acute lower respiratory infection: Secondary | ICD-10-CM | POA: Diagnosis not present

## 2021-03-11 DIAGNOSIS — C50212 Malignant neoplasm of upper-inner quadrant of left female breast: Secondary | ICD-10-CM | POA: Diagnosis not present

## 2021-03-11 DIAGNOSIS — R2232 Localized swelling, mass and lump, left upper limb: Secondary | ICD-10-CM | POA: Diagnosis not present

## 2021-03-11 DIAGNOSIS — Z170 Estrogen receptor positive status [ER+]: Secondary | ICD-10-CM | POA: Diagnosis not present

## 2021-03-11 NOTE — Telephone Encounter (Signed)
Pt husband called and stated pt is recovering from covid and concerned about cancer treatment. Return call, left vm stating that we are currently waiting on oncotype results which has ERD of 1/4 and we will go from there based on the results Contact information provided for further questions or needs.

## 2021-03-11 NOTE — Telephone Encounter (Signed)
Notified by clerical team that patient called to report she tested positive for COVID, and was concerned about what that would mean for her breast cancer treatment.   Called and spoke with patient directly. She reports she tested positive for COVID on 03/08/21, and has been experiencing a painful cough and swollen lymph nodes. Advised patient to reach out to her PCP for evaluation and management of symptoms. Also encouraged patient to update her surgeon's office since swollen lymph nodes are on the side of her recent surgery. Patient verbalized understanding and appreciation of call. No other needs identified at this time

## 2021-03-13 ENCOUNTER — Encounter: Payer: Self-pay | Admitting: *Deleted

## 2021-03-13 ENCOUNTER — Encounter (HOSPITAL_COMMUNITY): Payer: Self-pay

## 2021-03-13 ENCOUNTER — Telehealth: Payer: Self-pay | Admitting: *Deleted

## 2021-03-13 DIAGNOSIS — C50212 Malignant neoplasm of upper-inner quadrant of left female breast: Secondary | ICD-10-CM

## 2021-03-13 NOTE — Telephone Encounter (Signed)
Received oncotype score of 4. Physician team notified.  Pt left vm c/o "baseball size" swelling under her arm at site of SLN bx. Called [facility] and received appt in urgent office to see Puja PA at 10:15 at (12/29). Called pt and confirmed appt date and time. Notified pt of oncotype results of 4 and no need for chemo. Received verbal understanding.

## 2021-03-14 ENCOUNTER — Encounter: Payer: Self-pay | Admitting: Hematology

## 2021-03-19 ENCOUNTER — Ambulatory Visit: Payer: BC Managed Care – PPO | Admitting: Rehabilitation

## 2021-03-20 DIAGNOSIS — F332 Major depressive disorder, recurrent severe without psychotic features: Secondary | ICD-10-CM | POA: Diagnosis not present

## 2021-03-25 NOTE — Progress Notes (Signed)
Radiation Oncology         [phone] ________________________________  Name: Tracy [name] MRN: [id number]  Date: 03/26/2021  DOB: [birth year]  Follow-Up Visit Note  Outpatient  CC: Hortencia Pilar, MD  Truitt Merle, MD  Diagnosis:      ICD-10-CM   1. 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     Since consultation date of 02/12/21, the patient met with Cassandra Helingoetter PA-C and Dr. Burr Medico on 02/14/21 to discuss future systemic treatment options. During which time, Dr. Burr Medico recommended Oncotype Dx test on the final surgical sample from lumpectomy to make a definitive decision regarding adjuvant chemotherapy.  In addition, given the patient's strong ER and PR expression, Dr. Burr Medico recommended adjuvant endocrine therapy for a total of 5-10 years to reduce the risk of cancer recurrence.  The patient opted to proceed with left breast lumpectomy and nodal biopsies on the date of 02/25/21. Pathology from the procedure revealed: tumor size of 0.9 cm; histology of grade 2 invasive ductal carcinoma, and low to intermediate grade ductal carcinoma in-situ; all margins negative for both invasive and in-situ carcinoma; nodal status of 3/3 left axillary sentinel lymph node excisions negative for carcinoma;  ER status: 90% positive; PR status 95% positive, both with strong staining intensity, Her2 status negative; Proliferation marker Ki67 at 1%; Grade 2.  Oncotype DX was obtained on the final surgical sample and the recurrence score of 4 predicts a risk of recurrence outside the breast over the next 9 years of 3%, if the patient's only  systemic therapy is an antiestrogen for 5 years.  It also predicts no significant benefit from chemotherapy.  The patient presented to Dr. Lemar Lofty on 03/13/21 for post-op follow up. During which time, the patient reported a painful seroma to the left axilla the size of a gold ball.  Accordingly, Dr. Lemar Lofty aspirated 120 cc of noninfected fluid from the area. The patient did call in several days prior to this visit, though was not seen due to COVID-19 infection. Following pictures sent in of the seroma, she was prescribed doxycycline .   Symptomatically, the patient reports: Lymphedema issues, if any:  Patient denies, does report she had to have a seroma drained once since her surgery.     Pain issues, if any:  Reports pain along inside of left upper arm. Has some limitation in ROM when trying to raise arm above her head because of pain. Scheduled for PT evaluation tomorrow   SAFETY ISSUES: Prior radiation? No Pacemaker/ICD? No Possible current pregnancy? No--hysterectomy Is the patient on methotrexate? No  Current Complaints / other details:  Recently gained custody of her 22 year-old grandniece and recuperating from [place]:  is allergic to doxepin, morphine, nsaids, tramadol, and trazodone.  Meds: Current Outpatient Medications  Medication Sig Dispense Refill   acetaminophen (TYLENOL) 500 MG tablet Take 1,000 mg by mouth every 6 (six) hours as needed for mild pain.     albuterol (PROVENTIL) (2.5 MG/3ML) 0.083% nebulizer solution Take 2.5 mg by nebulization every 6 (  six) hours as needed for wheezing or shortness of breath.     albuterol (VENTOLIN HFA) 108 (90 Base) MCG/ACT inhaler Inhale 2 puffs into the lungs every 6 (six) hours as needed for wheezing or shortness of breath.     ALPRAZolam (XANAX XR) 1 MG 24 hr tablet Take 1 mg by mouth in the morning and at bedtime.     ALPRAZolam (XANAX) 0.5 MG tablet Take 0.5 tablets by mouth 2 (two) times daily as needed for anxiety.   0    azelastine (ASTELIN) 0.1 % nasal spray Place 1 spray into both nostrils daily as needed for allergies or rhinitis.  12   buPROPion (WELLBUTRIN XL) 300 MG 24 hr tablet Take 300 mg by mouth daily.     cetirizine (ZYRTEC) 10 MG tablet Take 10 mg by mouth daily as needed (allergies).     Cyanocobalamin 1000 MCG/ML KIT Inject 1,000 mcg as directed every 30 (thirty) days.     desvenlafaxine (PRISTIQ) 50 MG 24 hr tablet Take 50 mg by mouth daily.     gabapentin (NEURONTIN) 100 MG capsule Take 200 mg by mouth at bedtime as needed (pain).     hydrochlorothiazide (MICROZIDE) 12.5 MG capsule Take 12.5 mg by mouth daily.     lamoTRIgine (LAMICTAL) 100 MG tablet Take 200 mg by mouth 2 (two) times daily.     olopatadine (PATANOL) 0.1 % ophthalmic solution 1 drop daily as needed for allergies.     ondansetron (ZOFRAN) 4 MG tablet Take 1 tablet (4 mg total) by mouth daily as needed for nausea or vomiting. 30 tablet 1   oxyCODONE (OXY IR/ROXICODONE) 5 MG immediate release tablet Take 1 tablet (5 mg total) by mouth every 6 (six) hours as needed for severe pain. 15 tablet 0   promethazine-dextromethorphan (PROMETHAZINE-DM) 6.25-15 MG/5ML syrup Take 5 mLs by mouth every 6 (six) hours as needed.     valACYclovir (VALTREX) 1000 MG tablet Take 1,000 mg by mouth daily as needed (cold sores).      No current facility-administered medications for this encounter.    Physical Findings:  height is _0  (1.676 m) and weight is 304 lb 2 oz (138 kg) (abnormal). Her temporal temperature is 96.9 F (36.1 C) (abnormal). Her blood pressure is 133/70 and her pulse is 77. Her respiration is 18 and oxygen saturation is 96%. .     General: Alert and oriented, in no acute distress Psychiatric: Judgment and insight are intact. Affect is appropriate. Breast exam reveals satisfactory healing at surgical sites, left breast/axilla. No oozing from scars; there is a scab resolving at axillary scar.  Lab Findings: Lab Results   Component Value Date   WBC 5.7 02/18/2021   HGB 13.6 02/25/2021   HCT 40.0 02/25/2021   MCV 80.1 02/18/2021   PLT 341 02/18/2021    _1 @  Radiographic Findings: MM Breast Surgical Specimen  Result Date: 02/25/2021 CLINICAL DATA:  Status post seed localized lumpectomy of the LEFT breast. EXAM: SPECIMEN RADIOGRAPH OF THE LEFT BREAST COMPARISON:  Previous exam(s). FINDINGS: Status post excision of the left breast. The radioactive seed and ribbon shaped biopsy marker clip are present, completely intact, and were marked for pathology. IMPRESSION: Specimen radiograph of the LEFT breast. Electronically Signed   By: Nolon Nations M.D.   On: 02/25/2021 15:59  MM LT RADIOACTIVE SEED LOC MAMMO GUIDE  Result Date: 02/24/2021 CLINICAL DATA:  Biopsy proven invasive mammary carcinoma in the left breast. Radioactive seed localization. EXAM: MAMMOGRAPHIC GUIDED RADIOACTIVE  SEED LOCALIZATION OF THE LEFT BREAST COMPARISON:  Previous exam(s). FINDINGS: Patient presents for radioactive seed localization prior to surgery. I met with the patient and we discussed the procedure of seed localization including benefits and alternatives. We discussed the high likelihood of a successful procedure. We discussed the risks of the procedure including infection, bleeding, tissue injury and further surgery. We discussed the low dose of radioactivity involved in the procedure. Informed, written consent was given. The usual time-out protocol was performed immediately prior to the procedure. Using mammographic guidance, sterile technique, 1% lidocaine and an I-125 radioactive seed, the ribbon shaped clip was localized using a superior to inferior approach. The follow-up mammogram images confirm the seed in the expected location and were marked for Dr. Brantley Stage. Follow-up survey of the patient confirms presence of the radioactive seed. Order number of I-125 seed:  [id number]. Total activity:  2.947 millicuries reference  Date: 01/09/2021 The patient tolerated the procedure well and was released from the [facility]. She was given instructions regarding seed removal. IMPRESSION: Radioactive seed localization left breast. No apparent complications. Electronically Signed   By: Lillia Mountain M.D.   On: 02/24/2021 14:29   Impression/Plan: We discussed adjuvant radiotherapy today.  I recommend radiation to the left breast in order to reduce risk of locoregional recurrence by 2/3.  I reviewed the logistics, benefits, risks, and potential side effects of this treatment in detail. Risks may include but not necessary be limited to acute and late injury tissue in the radiation fields such as skin irritation (change in color/pigmentation, itching, dryness, pain, peeling). She may experience fatigue. We also discussed possible risk of long term cosmetic changes or scar tissue. There is also a smaller risk for lung toxicity, cardiac toxicity, brachial plexopathy, lymphedema, musculoskeletal changes, rib fragility or induction of a second malignancy, late chronic non-healing soft tissue wound.    The patient asked good questions which I answered to her satisfaction. She is enthusiastic about proceeding with treatment. A consent form has been signed and placed in her chart.  CT simulation today  Treatment will start next week.  On date of service, in total, I spent 30 minutes on this encounter. Patient was seen in person.  _____________________________________   Eppie Gibson, MD  This document serves as a record of services personally performed by Eppie Gibson, MD. It was created on her behalf by Roney Mans, a trained medical scribe. The creation of this record is based on the scribe's personal observations and the provider's statements to them. This document has been checked and approved by the attending provider.

## 2021-03-26 ENCOUNTER — Ambulatory Visit
Admission: RE | Admit: 2021-03-26 | Discharge: 2021-03-26 | Disposition: A | Discharge status: Home / Self Care | Payer: BC Managed Care – PPO | Source: Ambulatory Visit | Attending: Radiation Oncology | Admitting: Radiation Oncology

## 2021-03-26 ENCOUNTER — Other Ambulatory Visit: Payer: Self-pay

## 2021-03-26 ENCOUNTER — Encounter: Payer: Self-pay | Admitting: Radiation Oncology

## 2021-03-26 VITALS — BP 133/70 | HR 77 | Temp 96.9°F | Resp 18 | Ht 66.0 in | Wt 304.1 lb

## 2021-03-26 DIAGNOSIS — Z8616 Personal history of COVID-19: Secondary | ICD-10-CM | POA: Diagnosis not present

## 2021-03-26 DIAGNOSIS — F332 Major depressive disorder, recurrent severe without psychotic features: Secondary | ICD-10-CM | POA: Diagnosis not present

## 2021-03-26 DIAGNOSIS — Z170 Estrogen receptor positive status [ER+]: Secondary | ICD-10-CM | POA: Insufficient documentation

## 2021-03-26 DIAGNOSIS — C50212 Malignant neoplasm of upper-inner quadrant of left female breast: Secondary | ICD-10-CM | POA: Diagnosis not present

## 2021-03-26 DIAGNOSIS — Z510 Encounter for antineoplastic radiation therapy: Secondary | ICD-10-CM | POA: Diagnosis not present

## 2021-03-26 DIAGNOSIS — Z79899 Other long term (current) drug therapy: Secondary | ICD-10-CM | POA: Insufficient documentation

## 2021-03-26 NOTE — Progress Notes (Signed)
Location of Breast Cancer:  Malignant neoplasm of upper-inner quadrant of left breast in female, estrogen receptor positive  Histology per Pathology Report:  02/25/2021 FINAL MICROSCOPIC DIAGNOSIS:  A. BREAST, LEFT, LUMPECTOMY:  - Invasive ductal carcinoma, 0.9 cm, grade 2  - Ductal carcinoma in situ, low to intermediate grade  - Biopsy site change  - See oncology table  B. BREAST, LEFT ADDITIONAL ANTERIOSUPERIOR MARGIN, EXCISION:  - Benign breast parenchyma, negative for carcinoma  C. BREAST, LEFT ADDITIONAL MEDIOPOSTERIOR MARGIN, EXCISION:  - Benign breast parenchyma, negative for carcinoma  D. BREAST, LEFT ADDITIONAL INFERIOLATERAL MARGIN, EXCISION:  - Benign breast parenchyma, negative for carcinoma  E. LYMPH NODE, LEFT AXILLARY, SENTINEL, EXCISION:  - Lymph node, negative for carcinoma (0/1)  F. LYMPH NODE, LEFT AXILLARY, SENTINEL, EXCISION:  - Lymph node, negative for carcinoma (0/1)  G. LYMPH NODE, LEFT AXILLARY, SENTINEL, EXCISION:  - Lymph node, negative for carcinoma (0/1)   Receptor Status: ER(90%), PR (95%), Her2-neu (Negative via FISH), Ki-67(1%)  Did patient present with symptoms (if so, please note symptoms) or was this found on screening mammography?: 01/15/2021: Patient underwent screening mammogram which showed a 7 mm density left breast upper inner quadrant   Past/Anticipated interventions by surgeon, if any:  02/25/2021 --Dr. Marcello Moores [place] Left breast seed localized lumpectomy with left axillary sentinel lymph node mapping using mag trace   Past/Anticipated interventions by medical oncology, if any:  Under care of Dr. Truitt Merle 02/14/2021 ([facility] The patient was seen with Dr. Burr Medico today.  Dr. Burr Medico discussed her imaging findings and the biopsy results in great detail. She also discussed favorable prognosis in early stage hormone positive breast cancer.  Dr. Burr Medico recommend a Oncotype Dx test on the surgical sample if tumor is  >5 mm given that it is grade 2. That will help her make a decision about adjuvant chemotherapy based on the Oncotype result.  Written material of this test was given to her. If her surgical sentinel lymph node is positive, Dr. Burr Medico would recommend mammaprint for further risk stratification and guide adjuvant chemotherapy. Giving the strong ER and PR expression, Dr. Burr Medico recommends adjuvant endocrine therapy for a total of 5-10 years to reduce the risk of cancer recurrence.  Unclear if pre or postmenopausal. Will arrange [place] and LH at her next lab visit to determine whether she should be on aromatase inhibitor or tamoxifen.  We also discussed the breast cancer surveillance after her surgery. She will continue annual screening mammogram, self exam, and a routine office visit with lab and exam with [place].  --PLAN: Oncotype testing if tumor after surgical resection is greater than 5 mm Follow up after surgery or radiation oncology based on oncotype 03/13/2021 Phone note from [facility]: "Notified pt of oncotype results of 4 and no need for chemo. Received verbal understanding." Prescription given for compression bra FSH and LH testing with next lab draw.  Lymphedema issues, if any:  Patient denies, does report she had to have a seroma drained once since her surgery.     Pain issues, if any:  Reports pain along inside of left upper arm. Has some limitation in ROM when trying to raise arm above her head because of pain. Scheduled for PT evaluation tomorrow   SAFETY ISSUES: Prior radiation? No Pacemaker/ICD? No Possible current pregnancy? No--hysterectomy Is the patient on methotrexate? No  Current Complaints / other details:  Recently gained custody of her 63 year-old grandniece and recuperating from [place]

## 2021-03-27 ENCOUNTER — Ambulatory Visit: Payer: BC Managed Care – PPO | Attending: Surgery | Admitting: Rehabilitation

## 2021-03-27 ENCOUNTER — Encounter: Payer: Self-pay | Admitting: Rehabilitation

## 2021-03-27 DIAGNOSIS — M25612 Stiffness of left shoulder, not elsewhere classified: Secondary | ICD-10-CM | POA: Insufficient documentation

## 2021-03-27 DIAGNOSIS — C50212 Malignant neoplasm of upper-inner quadrant of left female breast: Secondary | ICD-10-CM | POA: Insufficient documentation

## 2021-03-27 DIAGNOSIS — F332 Major depressive disorder, recurrent severe without psychotic features: Secondary | ICD-10-CM | POA: Diagnosis not present

## 2021-03-27 DIAGNOSIS — R293 Abnormal posture: Secondary | ICD-10-CM | POA: Insufficient documentation

## 2021-03-27 DIAGNOSIS — Z170 Estrogen receptor positive status [ER+]: Secondary | ICD-10-CM | POA: Insufficient documentation

## 2021-03-27 DIAGNOSIS — Z483 Aftercare following surgery for neoplasm: Secondary | ICD-10-CM | POA: Insufficient documentation

## 2021-03-27 NOTE — Patient Instructions (Signed)
Access Code: [email or web address] URL: [email or web address]: 01/12/2023Prepared by: Marcene Brawn TevisExercises  Standing Median Nerve Glide - 1 x daily - 7 x weekly - 1 sets - 10 reps - no hold  Supine Shoulder Flexion AAROM with Hands Clasped - 1 x daily - 7 x weekly - 1-3 sets - 10 reps - 3 seconds hold  Supine Chest Stretch with Elbows Bent - 1 x daily - 7 x weekly - 1 sets - 3 reps - 6 seconds hold

## 2021-03-27 NOTE — Therapy (Signed)
[facility] @ [place], [place], [address] Phone: [phone]   Fax:  [phone]  Physical Therapy Treatment  Patient Details  Name: Tracy [name] MRN: [id number] Date of Birth: 1966/01/18 Referring Provider (PT): Dr. Brantley Stage   Encounter Date: 03/27/2021   PT End of Session - 03/27/21 1309     Visit Number 2    Number of Visits 8    Date for PT Re-Evaluation 05/08/21    PT Start Time 1232    PT Stop Time 1300   Pt had to leave to pick up neice   PT Time Calculation (min) 28 min    Activity Tolerance Patient tolerated treatment well    Behavior During Therapy [facility] for tasks assessed/performed             Past Medical History:  Diagnosis Date   Anemia    Anxiety    Anxiety and depression    Arthritis    left knee   Asthma    hardly uses inhaler   Depression    Diverticulosis    Fatty liver 2012   pt states she was told this once   Headache    Migraines   Heart murmur    pt was told this, no echo   Hypertension    history no longer requires medication management   IDA (iron deficiency anemia)    Melasma    Perforated ulcer ([place])    PUD (peptic ulcer disease)    Sleep apnea    mild, no CPAP ordered    Past Surgical History:  Procedure Laterality Date   ANKLE RECONSTRUCTION Left 09/09/2018   Procedure: BROSTRUM-GOULD LEFT;  Surgeon: Samara Deist, DPM;  Location: [facility];  Service: Podiatry;  Laterality: Left;   BREAST LUMPECTOMY WITH RADIOACTIVE SEED AND SENTINEL LYMPH NODE BIOPSY Left 02/25/2021   Procedure: LEFT BREAST LUMPECTOMY WITH RADIOACTIVE SEED AND SENTINEL LYMPH NODE BIOPSY;  Surgeon: Erroll Luna, MD;  Location: [facility];  Service: General;  Laterality: Left;   CHOLECYSTECTOMY     COLONOSCOPY     CYSTOSCOPY N/A 08/05/2016   Procedure: CYSTOSCOPY;  Surgeon: Bobbye Charleston, MD;  Location: [facility];  Service: Gynecology;  Laterality: N/A;   ENDOMETRIAL ABLATION W/ NOVASURE      GASTRIC BYPASS OPEN     LYSIS OF ADHESION N/A 08/05/2016   Procedure: LYSIS OF ADHESION;  Surgeon: Bobbye Charleston, MD;  Location: [facility];  Service: Gynecology;  Laterality: N/A;   perforated ulcer surgery     ROBOTIC ASSISTED SALPINGO OOPHERECTOMY Right 05/12/2017   Procedure: XI ROBOTIC ASSISTED RIGHT OOPHORECTOMY, Fulgeration of Endometriosis, Peritoneal Biopsy;  Surgeon: Bobbye Charleston, MD;  Location: [facility];  Service: Gynecology;  Laterality: Right;   ROBOTIC ASSISTED TOTAL HYSTERECTOMY WITH SALPINGECTOMY Bilateral 08/05/2016   Procedure: ROBOTIC ASSISTED TOTAL HYSTERECTOMY WITH SALPINGECTOMY;  Surgeon: Bobbye Charleston, MD;  Location: [facility];  Service: Gynecology;  Laterality: Bilateral;   TENDON REPAIR Left 09/09/2018   Procedure: TENOLYSIS, MULTIPLE;  Surgeon: Samara Deist, DPM;  Location: [facility];  Service: Podiatry;  Laterality: Left;   TONSILLECTOMY     UPPER GI ENDOSCOPY      There were no vitals filed for this visit.   Subjective Assessment - 03/27/21 1234     Subjective The breast is maybe a bit swollen the Lt upper arm is uncomfortable with reaching    Pertinent History 01/15/2021 left breast cancer ER/PR +, Her2 - Ki 1% Pt post left lumpectomy  12/13/202 with 0/3 lymph nodes removed.  Will have radiation starting next wednesday.  No chemotherapy. Seroma complication after surgery    Limitations Lifting    Currently in Pain? No/denies                [facility] PT Assessment - 03/27/21 0001       Assessment   Medical Diagnosis left breast cancer    Referring Provider (PT) Dr. Brantley Stage    Onset Date/Surgical Date 02/25/21    Hand Dominance Right      Precautions   Precaution Comments lymphedema risk Lt UE      Observation/Other Assessments   Observations cording not noted in standing put visible in supine D2 position with skin stretch. Wearing soft bralette type bra - education on other bras and where to get them    Skin Integrity Lt axillary incision slightly  irritated/red and a small open scab place most likely from sports bra rubbing      AROM   Left Shoulder Flexion 140 Degrees   pull in upper arm   Left Shoulder ABduction 125 Degrees   upper arm pull     Palpation   Patella mobility seroma edge noted inferior to axilla near incision which has been drained in the past and does not feel very full currently               LYMPHEDEMA/ONCOLOGY QUESTIONNAIRE - 03/27/21 0001       Surgeries   Lumpectomy Date 02/25/21    Number Lymph Nodes Removed 3      Treatment   Active Chemotherapy Treatment No    Past Chemotherapy Treatment No    Active Radiation Treatment No    Past Radiation Treatment No    Current Hormone Treatment No    Past Hormone Therapy No      What other symptoms do you have   Are you Having Heaviness or Tightness No    Are you having Pain Yes                        [facility] Adult PT Treatment/Exercise - 03/27/21 0001       Exercises   Exercises Other Exercises    Other Exercises  updated HEP per instruction section      Manual Therapy   Manual Therapy Passive ROM;Myofascial release    Myofascial Release with arm in T position to the forearm and upper arm with opposing force pull    Passive ROM lt shoulder into flexion, abduction, ER with education on cording throughout and POC                     PT Education - 03/27/21 1309     Education Details cording, POC, new stretches    Person(s) Educated Patient    Methods Explanation;Demonstration;Handout    Comprehension Verbalized understanding;Need further instruction                 PT Long Term Goals - 03/27/21 1317       PT LONG TERM GOAL #1   Title Pt will return to baseline AROM in the Lt shoulder    Baseline flex: 170, Abd: 165    Time 6    Period Weeks    Status New      PT LONG TERM GOAL #2   Title Pt will report no limitations from cording in the lt UE    Time 6  Period Weeks    Status New                    Plan - 03/27/21 1312     Clinical Impression Statement Pt presents post operatively with signs of Lt axillary cording with decreased AROM, visible axillary cording and pulling in the upper arm with reaching.  Pt had an abbreviated session today letting PT know she had to leave at 1, so pt will call to schedule 1-2x per week x 3 weeks. Will complete baseline assessment and continue education throughout.    Stability/Clinical Decision Making Stable/Uncomplicated    Clinical Decision Making Low    Rehab Potential Excellent    PT Frequency 2x / week    PT Duration 6 weeks    PT Treatment/Interventions Therapeutic exercise;Therapeutic activities;Manual lymph drainage;Manual techniques;Passive range of motion;Patient/family education;Orthotic Fit/Training;Taping    PT Next Visit Plan ( schedule SOZO/ABC class, review risk reduction, give post op instruction section) review/continue new stretches/pulleys, PROM/cording release to Lt UE/axilla    PT Home Exercise Plan Access Code: [email or web address]    Consulted and Agree with Plan of Care Patient             Patient will benefit from skilled therapeutic intervention in order to improve the following deficits and impairments:  Decreased knowledge of precautions, Decreased knowledge of use of DME, Postural dysfunction, Decreased range of motion, Increased edema  Visit Diagnosis: Malignant neoplasm of upper-inner quadrant of left breast in female, estrogen receptor positive ([place])  Abnormal posture  Aftercare following surgery for neoplasm  Stiffness of left shoulder, not elsewhere classified     Problem List Patient Active Problem List   Diagnosis Date Noted   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 ([place]) 02/12/2021   Lymphedema 10/04/2017   Postoperative state 08/05/2016   Anxiety and depression 01/29/2015   Airway hyperreactivity 01/29/2015   Gastroduodenal ulcer 01/29/2015   Other iron deficiency anemia 01/29/2015    H/O infectious disease 12/01/2013   DD (diverticular disease) 10/02/2013   Cardiac murmur 09/12/2013   Chloasma 09/12/2013   Absolute anemia 01/09/2013    Stark Bray, PT 03/27/2021, 1:18 PM  [facility] @ [place], [place], [address] Phone: [phone]   Fax:  [phone]  Name: Tracy [name] MRN: [id number] Date of Birth: Jan 05, 1966

## 2021-03-31 DIAGNOSIS — F332 Major depressive disorder, recurrent severe without psychotic features: Secondary | ICD-10-CM | POA: Diagnosis not present

## 2021-04-02 ENCOUNTER — Encounter: Payer: Self-pay | Admitting: *Deleted

## 2021-04-02 DIAGNOSIS — Z170 Estrogen receptor positive status [ER+]: Secondary | ICD-10-CM | POA: Diagnosis not present

## 2021-04-02 DIAGNOSIS — Z510 Encounter for antineoplastic radiation therapy: Secondary | ICD-10-CM | POA: Diagnosis not present

## 2021-04-02 DIAGNOSIS — F332 Major depressive disorder, recurrent severe without psychotic features: Secondary | ICD-10-CM | POA: Diagnosis not present

## 2021-04-02 DIAGNOSIS — C50212 Malignant neoplasm of upper-inner quadrant of left female breast: Secondary | ICD-10-CM | POA: Diagnosis not present

## 2021-04-03 ENCOUNTER — Ambulatory Visit
Admission: RE | Admit: 2021-04-03 | Discharge: 2021-04-03 | Disposition: A | Discharge status: Home / Self Care | Payer: BC Managed Care – PPO | Source: Ambulatory Visit | Attending: Radiation Oncology | Admitting: Radiation Oncology

## 2021-04-03 ENCOUNTER — Other Ambulatory Visit: Payer: Self-pay

## 2021-04-03 DIAGNOSIS — Z170 Estrogen receptor positive status [ER+]: Secondary | ICD-10-CM | POA: Diagnosis not present

## 2021-04-03 DIAGNOSIS — C50212 Malignant neoplasm of upper-inner quadrant of left female breast: Secondary | ICD-10-CM | POA: Diagnosis not present

## 2021-04-03 DIAGNOSIS — Z510 Encounter for antineoplastic radiation therapy: Secondary | ICD-10-CM | POA: Diagnosis not present

## 2021-04-04 ENCOUNTER — Ambulatory Visit
Admission: RE | Admit: 2021-04-04 | Discharge: 2021-04-04 | Disposition: A | Discharge status: Home / Self Care | Payer: BC Managed Care – PPO | Source: Ambulatory Visit | Attending: Radiation Oncology | Admitting: Radiation Oncology

## 2021-04-04 DIAGNOSIS — Z170 Estrogen receptor positive status [ER+]: Secondary | ICD-10-CM | POA: Diagnosis not present

## 2021-04-04 DIAGNOSIS — Z510 Encounter for antineoplastic radiation therapy: Secondary | ICD-10-CM | POA: Diagnosis not present

## 2021-04-04 DIAGNOSIS — C50212 Malignant neoplasm of upper-inner quadrant of left female breast: Secondary | ICD-10-CM | POA: Diagnosis not present

## 2021-04-07 ENCOUNTER — Ambulatory Visit
Admission: RE | Admit: 2021-04-07 | Discharge: 2021-04-07 | Disposition: A | Discharge status: Home / Self Care | Payer: BC Managed Care – PPO | Source: Ambulatory Visit | Attending: Radiation Oncology | Admitting: Radiation Oncology

## 2021-04-07 ENCOUNTER — Other Ambulatory Visit: Payer: Self-pay

## 2021-04-07 DIAGNOSIS — F332 Major depressive disorder, recurrent severe without psychotic features: Secondary | ICD-10-CM | POA: Diagnosis not present

## 2021-04-07 DIAGNOSIS — Z510 Encounter for antineoplastic radiation therapy: Secondary | ICD-10-CM | POA: Diagnosis not present

## 2021-04-07 DIAGNOSIS — C50212 Malignant neoplasm of upper-inner quadrant of left female breast: Secondary | ICD-10-CM | POA: Diagnosis not present

## 2021-04-07 DIAGNOSIS — Z170 Estrogen receptor positive status [ER+]: Secondary | ICD-10-CM | POA: Diagnosis not present

## 2021-04-07 MED ORDER — ALRA NON-METALLIC DEODORANT (RAD-ONC)
1.0000 "application " | Freq: Once | TOPICAL | Status: AC
  Administered 2021-04-07: 1 via TOPICAL

## 2021-04-07 MED ORDER — RADIAPLEXRX EX GEL: Freq: Once | CUTANEOUS | Status: AC

## 2021-04-07 NOTE — Progress Notes (Signed)
Pt here for patient teaching. Pt given Managing Acute Radiation Side Effects for Head and Neck Cancer handout, skin care instructions, Alra deodorant, and Radiaplex gel. Reviewed areas of pertinence such as fatigue, hair loss, skin changes, breast tenderness, breast swelling, shortness of breath, earaches, and taste changes. Pt able to give teach back of to pat skin, use unscented/gentle soap, and drink plenty of water, apply Radiaplex bid, avoid applying anything to skin within 4 hours of treatment, avoid wearing an under wire bra, and to use an electric razor if they must shave. Pt verbalizes understanding of information given and will contact nursing with any questions or concerns.     [email or web address]

## 2021-04-08 ENCOUNTER — Ambulatory Visit
Admission: RE | Admit: 2021-04-08 | Discharge: 2021-04-08 | Disposition: A | Discharge status: Home / Self Care | Payer: BC Managed Care – PPO | Source: Ambulatory Visit | Attending: Radiation Oncology | Admitting: Radiation Oncology

## 2021-04-08 DIAGNOSIS — Z510 Encounter for antineoplastic radiation therapy: Secondary | ICD-10-CM | POA: Diagnosis not present

## 2021-04-08 DIAGNOSIS — C50212 Malignant neoplasm of upper-inner quadrant of left female breast: Secondary | ICD-10-CM | POA: Diagnosis not present

## 2021-04-08 DIAGNOSIS — Z170 Estrogen receptor positive status [ER+]: Secondary | ICD-10-CM | POA: Diagnosis not present

## 2021-04-09 ENCOUNTER — Ambulatory Visit: Payer: BC Managed Care – PPO

## 2021-04-09 DIAGNOSIS — F332 Major depressive disorder, recurrent severe without psychotic features: Secondary | ICD-10-CM | POA: Diagnosis not present

## 2021-04-10 ENCOUNTER — Other Ambulatory Visit: Payer: Self-pay

## 2021-04-10 ENCOUNTER — Ambulatory Visit
Admission: RE | Admit: 2021-04-10 | Discharge: 2021-04-10 | Disposition: A | Discharge status: Home / Self Care | Payer: BC Managed Care – PPO | Source: Ambulatory Visit | Attending: Radiation Oncology | Admitting: Radiation Oncology

## 2021-04-10 DIAGNOSIS — Z170 Estrogen receptor positive status [ER+]: Secondary | ICD-10-CM | POA: Diagnosis not present

## 2021-04-10 DIAGNOSIS — Z510 Encounter for antineoplastic radiation therapy: Secondary | ICD-10-CM | POA: Diagnosis not present

## 2021-04-10 DIAGNOSIS — C50212 Malignant neoplasm of upper-inner quadrant of left female breast: Secondary | ICD-10-CM | POA: Diagnosis not present

## 2021-04-11 ENCOUNTER — Ambulatory Visit
Admission: RE | Admit: 2021-04-11 | Discharge: 2021-04-11 | Disposition: A | Discharge status: Home / Self Care | Payer: BC Managed Care – PPO | Source: Ambulatory Visit | Attending: Radiation Oncology | Admitting: Radiation Oncology

## 2021-04-11 ENCOUNTER — Other Ambulatory Visit: Payer: Self-pay

## 2021-04-11 DIAGNOSIS — C50212 Malignant neoplasm of upper-inner quadrant of left female breast: Secondary | ICD-10-CM | POA: Diagnosis not present

## 2021-04-11 DIAGNOSIS — Z510 Encounter for antineoplastic radiation therapy: Secondary | ICD-10-CM | POA: Diagnosis not present

## 2021-04-11 DIAGNOSIS — Z170 Estrogen receptor positive status [ER+]: Secondary | ICD-10-CM | POA: Diagnosis not present

## 2021-04-14 ENCOUNTER — Ambulatory Visit
Admission: RE | Admit: 2021-04-14 | Discharge: 2021-04-14 | Disposition: A | Discharge status: Home / Self Care | Payer: BC Managed Care – PPO | Source: Ambulatory Visit | Attending: Radiation Oncology | Admitting: Radiation Oncology

## 2021-04-14 ENCOUNTER — Other Ambulatory Visit: Payer: Self-pay

## 2021-04-14 DIAGNOSIS — Z510 Encounter for antineoplastic radiation therapy: Secondary | ICD-10-CM | POA: Diagnosis not present

## 2021-04-14 DIAGNOSIS — Z170 Estrogen receptor positive status [ER+]: Secondary | ICD-10-CM | POA: Diagnosis not present

## 2021-04-14 DIAGNOSIS — F332 Major depressive disorder, recurrent severe without psychotic features: Secondary | ICD-10-CM | POA: Diagnosis not present

## 2021-04-14 DIAGNOSIS — C50212 Malignant neoplasm of upper-inner quadrant of left female breast: Secondary | ICD-10-CM | POA: Diagnosis not present

## 2021-04-15 ENCOUNTER — Ambulatory Visit
Admission: RE | Admit: 2021-04-15 | Discharge: 2021-04-15 | Disposition: A | Discharge status: Home / Self Care | Payer: BC Managed Care – PPO | Source: Ambulatory Visit | Attending: Radiation Oncology | Admitting: Radiation Oncology

## 2021-04-15 DIAGNOSIS — C50212 Malignant neoplasm of upper-inner quadrant of left female breast: Secondary | ICD-10-CM | POA: Diagnosis not present

## 2021-04-15 DIAGNOSIS — Z170 Estrogen receptor positive status [ER+]: Secondary | ICD-10-CM | POA: Diagnosis not present

## 2021-04-15 DIAGNOSIS — Z510 Encounter for antineoplastic radiation therapy: Secondary | ICD-10-CM | POA: Diagnosis not present

## 2021-04-16 ENCOUNTER — Other Ambulatory Visit: Payer: Self-pay

## 2021-04-16 ENCOUNTER — Ambulatory Visit
Admission: RE | Admit: 2021-04-16 | Discharge: 2021-04-16 | Disposition: A | Discharge status: Home / Self Care | Payer: BC Managed Care – PPO | Source: Ambulatory Visit | Attending: Radiation Oncology | Admitting: Radiation Oncology

## 2021-04-16 DIAGNOSIS — Z170 Estrogen receptor positive status [ER+]: Secondary | ICD-10-CM | POA: Insufficient documentation

## 2021-04-16 DIAGNOSIS — C50212 Malignant neoplasm of upper-inner quadrant of left female breast: Secondary | ICD-10-CM | POA: Diagnosis not present

## 2021-04-16 DIAGNOSIS — F332 Major depressive disorder, recurrent severe without psychotic features: Secondary | ICD-10-CM | POA: Diagnosis not present

## 2021-04-17 ENCOUNTER — Ambulatory Visit
Admission: RE | Admit: 2021-04-17 | Discharge: 2021-04-17 | Disposition: A | Discharge status: Home / Self Care | Payer: BC Managed Care – PPO | Source: Ambulatory Visit | Attending: Radiation Oncology | Admitting: Radiation Oncology

## 2021-04-17 DIAGNOSIS — Z170 Estrogen receptor positive status [ER+]: Secondary | ICD-10-CM | POA: Diagnosis not present

## 2021-04-17 DIAGNOSIS — C50212 Malignant neoplasm of upper-inner quadrant of left female breast: Secondary | ICD-10-CM | POA: Diagnosis not present

## 2021-04-18 ENCOUNTER — Ambulatory Visit: Payer: BC Managed Care – PPO

## 2021-04-18 ENCOUNTER — Telehealth: Payer: Self-pay | Admitting: Hematology

## 2021-04-18 NOTE — Telephone Encounter (Signed)
Sch per 1/16 inb, pt aware

## 2021-04-21 ENCOUNTER — Other Ambulatory Visit: Payer: Self-pay

## 2021-04-21 ENCOUNTER — Ambulatory Visit
Admission: RE | Admit: 2021-04-21 | Discharge: 2021-04-21 | Disposition: A | Discharge status: Home / Self Care | Payer: BC Managed Care – PPO | Source: Ambulatory Visit | Attending: Radiation Oncology | Admitting: Radiation Oncology

## 2021-04-21 ENCOUNTER — Ambulatory Visit: Payer: BC Managed Care – PPO | Admitting: Radiation Oncology

## 2021-04-21 DIAGNOSIS — F332 Major depressive disorder, recurrent severe without psychotic features: Secondary | ICD-10-CM | POA: Diagnosis not present

## 2021-04-21 DIAGNOSIS — Z170 Estrogen receptor positive status [ER+]: Secondary | ICD-10-CM | POA: Diagnosis not present

## 2021-04-21 DIAGNOSIS — C50212 Malignant neoplasm of upper-inner quadrant of left female breast: Secondary | ICD-10-CM | POA: Diagnosis not present

## 2021-04-22 ENCOUNTER — Ambulatory Visit
Admission: RE | Admit: 2021-04-22 | Discharge: 2021-04-22 | Disposition: A | Discharge status: Home / Self Care | Payer: BC Managed Care – PPO | Source: Ambulatory Visit | Attending: Radiation Oncology | Admitting: Radiation Oncology

## 2021-04-22 DIAGNOSIS — C50212 Malignant neoplasm of upper-inner quadrant of left female breast: Secondary | ICD-10-CM | POA: Diagnosis not present

## 2021-04-22 DIAGNOSIS — J40 Bronchitis, not specified as acute or chronic: Secondary | ICD-10-CM | POA: Diagnosis not present

## 2021-04-22 DIAGNOSIS — Z170 Estrogen receptor positive status [ER+]: Secondary | ICD-10-CM | POA: Diagnosis not present

## 2021-04-22 DIAGNOSIS — R059 Cough, unspecified: Secondary | ICD-10-CM | POA: Diagnosis not present

## 2021-04-23 ENCOUNTER — Ambulatory Visit: Payer: BC Managed Care – PPO

## 2021-04-24 ENCOUNTER — Ambulatory Visit
Admission: RE | Admit: 2021-04-24 | Discharge: 2021-04-24 | Disposition: A | Discharge status: Home / Self Care | Payer: BC Managed Care – PPO | Source: Ambulatory Visit | Attending: Radiation Oncology | Admitting: Radiation Oncology

## 2021-04-24 ENCOUNTER — Other Ambulatory Visit: Payer: Self-pay

## 2021-04-24 ENCOUNTER — Ambulatory Visit: Payer: BC Managed Care – PPO

## 2021-04-24 DIAGNOSIS — C50212 Malignant neoplasm of upper-inner quadrant of left female breast: Secondary | ICD-10-CM | POA: Diagnosis not present

## 2021-04-24 DIAGNOSIS — J40 Bronchitis, not specified as acute or chronic: Secondary | ICD-10-CM | POA: Diagnosis not present

## 2021-04-24 DIAGNOSIS — Z170 Estrogen receptor positive status [ER+]: Secondary | ICD-10-CM | POA: Diagnosis not present

## 2021-04-24 DIAGNOSIS — F332 Major depressive disorder, recurrent severe without psychotic features: Secondary | ICD-10-CM | POA: Diagnosis not present

## 2021-04-25 ENCOUNTER — Ambulatory Visit: Payer: BC Managed Care – PPO

## 2021-04-25 ENCOUNTER — Ambulatory Visit
Admission: RE | Admit: 2021-04-25 | Discharge: 2021-04-25 | Disposition: A | Discharge status: Home / Self Care | Payer: BC Managed Care – PPO | Source: Ambulatory Visit | Attending: Radiation Oncology | Admitting: Radiation Oncology

## 2021-04-25 DIAGNOSIS — Z170 Estrogen receptor positive status [ER+]: Secondary | ICD-10-CM | POA: Diagnosis not present

## 2021-04-25 DIAGNOSIS — C50212 Malignant neoplasm of upper-inner quadrant of left female breast: Secondary | ICD-10-CM | POA: Diagnosis not present

## 2021-04-28 ENCOUNTER — Ambulatory Visit
Admission: RE | Admit: 2021-04-28 | Discharge: 2021-04-28 | Disposition: A | Discharge status: Home / Self Care | Payer: BC Managed Care – PPO | Source: Ambulatory Visit | Attending: Radiation Oncology | Admitting: Radiation Oncology

## 2021-04-28 ENCOUNTER — Other Ambulatory Visit: Payer: Self-pay

## 2021-04-28 ENCOUNTER — Ambulatory Visit: Payer: BC Managed Care – PPO

## 2021-04-28 DIAGNOSIS — Z170 Estrogen receptor positive status [ER+]: Secondary | ICD-10-CM | POA: Diagnosis not present

## 2021-04-28 DIAGNOSIS — C50212 Malignant neoplasm of upper-inner quadrant of left female breast: Secondary | ICD-10-CM

## 2021-04-28 MED ORDER — SILVER SULFADIAZINE 1 % EX CREA: TOPICAL_CREAM | Freq: Two times a day (BID) | CUTANEOUS | Status: DC

## 2021-04-29 ENCOUNTER — Ambulatory Visit: Payer: BC Managed Care – PPO

## 2021-04-29 ENCOUNTER — Other Ambulatory Visit: Payer: Self-pay

## 2021-04-29 ENCOUNTER — Ambulatory Visit
Admission: RE | Admit: 2021-04-29 | Discharge: 2021-04-29 | Disposition: A | Discharge status: Home / Self Care | Payer: BC Managed Care – PPO | Source: Ambulatory Visit | Attending: Radiation Oncology | Admitting: Radiation Oncology

## 2021-04-29 ENCOUNTER — Encounter: Payer: Self-pay | Admitting: *Deleted

## 2021-04-29 DIAGNOSIS — Z170 Estrogen receptor positive status [ER+]: Secondary | ICD-10-CM | POA: Diagnosis not present

## 2021-04-29 DIAGNOSIS — C50212 Malignant neoplasm of upper-inner quadrant of left female breast: Secondary | ICD-10-CM | POA: Diagnosis not present

## 2021-04-30 ENCOUNTER — Ambulatory Visit
Admission: RE | Admit: 2021-04-30 | Discharge: 2021-04-30 | Disposition: A | Discharge status: Home / Self Care | Payer: BC Managed Care – PPO | Source: Ambulatory Visit | Attending: Radiation Oncology | Admitting: Radiation Oncology

## 2021-04-30 ENCOUNTER — Ambulatory Visit: Payer: BC Managed Care – PPO | Admitting: Radiation Oncology

## 2021-04-30 ENCOUNTER — Inpatient Hospital Stay: Payer: BC Managed Care – PPO | Admitting: Hematology

## 2021-04-30 DIAGNOSIS — Z170 Estrogen receptor positive status [ER+]: Secondary | ICD-10-CM | POA: Diagnosis not present

## 2021-04-30 DIAGNOSIS — C50212 Malignant neoplasm of upper-inner quadrant of left female breast: Secondary | ICD-10-CM | POA: Diagnosis not present

## 2021-05-01 ENCOUNTER — Ambulatory Visit: Payer: BC Managed Care – PPO

## 2021-05-01 ENCOUNTER — Ambulatory Visit
Admission: RE | Admit: 2021-05-01 | Discharge: 2021-05-01 | Disposition: A | Discharge status: Home / Self Care | Payer: BC Managed Care – PPO | Source: Ambulatory Visit | Attending: Radiation Oncology | Admitting: Radiation Oncology

## 2021-05-01 ENCOUNTER — Other Ambulatory Visit: Payer: Self-pay

## 2021-05-01 DIAGNOSIS — C50212 Malignant neoplasm of upper-inner quadrant of left female breast: Secondary | ICD-10-CM | POA: Diagnosis not present

## 2021-05-01 DIAGNOSIS — F332 Major depressive disorder, recurrent severe without psychotic features: Secondary | ICD-10-CM | POA: Diagnosis not present

## 2021-05-01 DIAGNOSIS — Z170 Estrogen receptor positive status [ER+]: Secondary | ICD-10-CM | POA: Diagnosis not present

## 2021-05-02 ENCOUNTER — Inpatient Hospital Stay (HOSPITAL_BASED_OUTPATIENT_CLINIC_OR_DEPARTMENT_OTHER): Payer: BC Managed Care – PPO | Admitting: Hematology

## 2021-05-02 ENCOUNTER — Ambulatory Visit
Admission: RE | Admit: 2021-05-02 | Discharge: 2021-05-02 | Disposition: A | Discharge status: Home / Self Care | Payer: BC Managed Care – PPO | Source: Ambulatory Visit | Attending: Radiation Oncology | Admitting: Radiation Oncology

## 2021-05-02 ENCOUNTER — Inpatient Hospital Stay: Payer: BC Managed Care – PPO

## 2021-05-02 VITALS — BP 152/72 | HR 82 | Temp 98.2°F | Resp 18 | Wt 299.4 lb

## 2021-05-02 DIAGNOSIS — Z170 Estrogen receptor positive status [ER+]: Secondary | ICD-10-CM | POA: Insufficient documentation

## 2021-05-02 DIAGNOSIS — E669 Obesity, unspecified: Secondary | ICD-10-CM | POA: Insufficient documentation

## 2021-05-02 DIAGNOSIS — C50212 Malignant neoplasm of upper-inner quadrant of left female breast: Secondary | ICD-10-CM | POA: Insufficient documentation

## 2021-05-02 DIAGNOSIS — Z79899 Other long term (current) drug therapy: Secondary | ICD-10-CM | POA: Insufficient documentation

## 2021-05-02 DIAGNOSIS — Z800 Family history of malignant neoplasm of digestive organs: Secondary | ICD-10-CM | POA: Insufficient documentation

## 2021-05-02 LAB — CBC WITH DIFFERENTIAL/PLATELET
Abs Immature Granulocytes: 0.01 10*3/uL (ref 0.00–0.07)
Basophils Absolute: 0.1 10*3/uL (ref 0.0–0.1)
Basophils Relative: 1 %
Eosinophils Absolute: 0.3 10*3/uL (ref 0.0–0.5)
Eosinophils Relative: 4 %
HCT: 33.6 % — ABNORMAL LOW (ref 36.0–46.0)
Hemoglobin: 10.3 g/dL — ABNORMAL LOW (ref 12.0–15.0)
Immature Granulocytes: 0 %
Lymphocytes Relative: 31 %
Lymphs Abs: 1.8 10*3/uL (ref 0.7–4.0)
MCH: 22.9 pg — ABNORMAL LOW (ref 26.0–34.0)
MCHC: 30.7 g/dL (ref 30.0–36.0)
MCV: 74.7 fL — ABNORMAL LOW (ref 80.0–100.0)
Monocytes Absolute: 0.4 10*3/uL (ref 0.1–1.0)
Monocytes Relative: 8 %
Neutro Abs: 3.3 10*3/uL (ref 1.7–7.7)
Neutrophils Relative %: 56 %
Platelets: 294 10*3/uL (ref 150–400)
RBC: 4.5 MIL/uL (ref 3.87–5.11)
RDW: 17.7 % — ABNORMAL HIGH (ref 11.5–15.5)
WBC: 5.9 10*3/uL (ref 4.0–10.5)
nRBC: 0 % (ref 0.0–0.2)

## 2021-05-02 LAB — COMPREHENSIVE METABOLIC PANEL
ALT: 27 U/L (ref 0–44)
AST: 34 U/L (ref 15–41)
Albumin: 3.9 g/dL (ref 3.5–5.0)
Alkaline Phosphatase: 236 U/L — ABNORMAL HIGH (ref 38–126)
Anion gap: 7 (ref 5–15)
BUN: 9 mg/dL (ref 6–20)
CO2: 35 mmol/L — ABNORMAL HIGH (ref 22–32)
Calcium: 8.9 mg/dL (ref 8.9–10.3)
Chloride: 96 mmol/L — ABNORMAL LOW (ref 98–111)
Creatinine, Ser: 0.55 mg/dL (ref 0.44–1.00)
GFR, Estimated: >60 mL/min (ref >60)
Glucose, Bld: 104 mg/dL — ABNORMAL HIGH (ref 70–99)
Potassium: 3.1 mmol/L — ABNORMAL LOW (ref 3.5–5.1)
Sodium: 138 mmol/L (ref 135–145)
Total Bilirubin: 0.4 mg/dL (ref 0.3–1.2)
Total Protein: 7.1 g/dL (ref 6.5–8.1)

## 2021-05-02 NOTE — Progress Notes (Signed)
[facility]   [phone] [phone]) [phone]   Clinic Follow up Note   Patient Care Team: Hortencia Pilar, MD as PCP - General (Family Medicine) Rockwell Germany, RN as Oncology Nurse Navigator Mauro Kaufmann, RN as Oncology Nurse Navigator Truitt Merle, MD as Consulting Physician (Hematology)  Date of Service:  05/02/2021  CHIEF COMPLAINT: f/u of left breast cancer  CURRENT THERAPY:  Adjuvant radiation, 04/03/21 - 05/05/21  ASSESSMENT & PLAN:  Tracy [name] is a 56 y.o. female with   1. Malignant neoplasm of upper-inner quadrant of left breast, invasive ductal carcinoma, pT1b, N0, M0, Stage 1A, ER/PR: Positive, HER2: negative, Grade 2.  Oncotype RS 4 -Found on screening mammogram. Biopsy on 01/23/21 showed IDC. -she had left lumpectomy 02/25/21 with Dr. Brantley Stage, pathology showed 0.9 cm IDC, benign margins and lymph nodes (0/3). -Oncotype recurrence score of 4, indicating low risk. Adjuvant chemotherapy is not recommended. -she is currently receiving adjuvant radiation under Dr. Isidore Moos, scheduled to finish on Monday, 2/20.  -Giving the strong ER and PR expression, I recommend adjuvant endocrine therapy for a total of 5-10 years to reduce the risk of cancer recurrence. Unclear if pre or postmenopausal. Will proceed with [place] and [facility] today to determine whether she should be on aromatase inhibitor or tamoxifen.  I reviewed side effects of AI and tamoxifen in detail with her today.  She agrees to proceed -We also discussed the breast cancer surveillance after her surgery. She will continue annual screening mammogram, self exam, and a routine office visit with lab and exam with [place].    2. Family History of Cancer/Genetics -Patient's sister diagnosed with pancreatic cancer at age 9. Due to this, the patient underwent genetic testing to determine her hereditary risk for cancer. -The patient is unsure of the name of the test she had performed but stated it was negative for  hereditary risk of cancer.  3. Obesity  -We discussed that AI can cause weight gain. -she is interested in losing weight. I will refer her to the weight clinic    PLAN: -proceed to lab today, her [facility] and estradiol level indicating she is premenopausal, I called in tamoxifen today, will start in March  -finish radiation on 2/20 -referral to weight management -survivorship in 3 months -lab and f/u in 6 months   No problem-specific Assessment & Plan notes found for this encounter.   SUMMARY OF ONCOLOGIC HISTORY: Oncology History Overview Note   Cancer Staging  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 ([place]) Staging form: Breast, AJCC 8th Edition - Clinical stage from 02/12/2021: Stage IA (cT1b, cN0, cM0, G2, ER+, PR+, HER2-) - Signed by Eppie Gibson, MD on 02/12/2021 Stage prefix: Initial diagnosis Histologic grading system: 3 grade system - Pathologic stage from 02/25/2021: Stage IA (pT1b, pN0, cM0, G2, ER+, PR+, HER2-, Oncotype DX score: 3) - Signed by Truitt Merle, MD on 05/03/2021 Stage prefix: Initial diagnosis Multigene prognostic tests performed: Oncotype DX Recurrence score range: Less than 11 Histologic grading system: 3 grade system Residual tumor (R): R0 - None     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 ([place])  01/15/2021 Imaging   MM DIAG BREAST TOMO UNI LEFT   IMPRESSION: 1. Suspicious left breast mass corresponding with the screening mammographic findings. 2. No suspicious left axillary lymphadenopathy.   RECOMMENDATION: Ultrasound-guided biopsy of the left breast.   01/23/2021 Procedure   ULTRASOUND GUIDED LEFT BREAST CORE NEEDLE BIOPSY   IMPRESSION: Ultrasound guided biopsy of a mass  in the left breast at 11 o'clock. No apparent complications.   01/23/2021 Pathology Results   Diagnosis Breast, left, needle core biopsy, left breast 11:00, 6cmfn, ribbon clip - INVASIVE MAMMARY CARCINOMA - LOBULAR NEOPLASIA (ATYPICAL LOBULAR HYPERPLASIA) -  SEE COMMENT Microscopic Comment the biopsy material shows an infiltrative proliferation of cells arranged linearly and in small clusters. Based on the biopsy, the carcinoma appears Nottingham grade 2 of 3 and measures 0.4 cm in greatest linear extent.  Estrogen Receptor: 90%, POSITIVE, STRONG STAINING INTENSITY Progesterone Receptor: 95%, POSITIVE, STRONG STAINING INTENSITY Proliferation Marker Ki67: 1% REFERENCE RANGE ESTROGEN RECEPTOR NEGATIVE 0% POSITIVE =>1% REFERENCE RANGE PROGESTERONE RECEPTOR NEGATIVE 0% POSITIVE =>1%  Results: GROUP 5: HER2 **NEGATIVE**   02/12/2021 Initial Diagnosis   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 ([place])   02/12/2021 Cancer Staging   Staging form: Breast, AJCC 8th Edition - Clinical stage from 02/12/2021: Stage IA (cT1b, cN0, cM0, G2, ER+, PR+, HER2-) - Signed by Eppie Gibson, MD on 02/12/2021 Stage prefix: Initial diagnosis Histologic grading system: 3 grade system    02/25/2021 Definitive Surgery   FINAL MICROSCOPIC DIAGNOSIS:   A. 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 BREAST, LEFT ADDITIONAL ANTERIOSUPERIOR MARGIN, EXCISION:  - Benign breast parenchyma, negative for carcinoma   C. BREAST, LEFT ADDITIONAL MEDIOPOSTERIOR MARGIN, EXCISION:  - Benign breast parenchyma, negative for carcinoma   D. BREAST, LEFT ADDITIONAL INFERIOLATERAL MARGIN, EXCISION:  - Benign breast parenchyma, negative for carcinoma   E. LYMPH NODE, LEFT AXILLARY, SENTINEL, EXCISION:  - Lymph node, negative for carcinoma (0/1)   F. LYMPH NODE, LEFT AXILLARY, SENTINEL, EXCISION:  - Lymph node, negative for carcinoma (0/1)   G. LYMPH NODE, LEFT AXILLARY, SENTINEL, EXCISION:  - Lymph node, negative for carcinoma (0/1)    02/25/2021 Oncotype testing   Recurrence score of 4, predicts a risk of recurrence outside the  breast over the next 9 years of 3%    02/25/2021 Cancer Staging   Staging form: Breast, AJCC 8th Edition - Pathologic stage from 02/25/2021: Stage IA (pT1b, pN0, cM0, G2, ER+, PR+, HER2-, Oncotype DX score: 3) - Signed by Truitt Merle, MD on 05/03/2021 Stage prefix: Initial diagnosis Multigene prognostic tests performed: Oncotype DX Recurrence score range: Less than 11 Histologic grading system: 3 grade system Residual tumor (R): R0 - None       INTERVAL HISTORY:  Tracy [name] is here for a follow up of breast cancer. She was last seen by me on 02/14/21 in consultation with PA Cassie. She presents to the clinic alone. She reports she is having a lot of skin burning with breakdown and tenderness, as well as fatigue.  She also notes she has been constantly getting sick since her diagnosis, including Covid.   All other systems were reviewed with the patient and are negative.  MEDICAL HISTORY:  Past Medical History:  Diagnosis Date   Anemia    Anxiety    Anxiety and depression    Arthritis    left knee   Asthma    hardly uses inhaler   Depression    Diverticulosis    Fatty liver 2012   pt states she was told this once   Headache    Migraines   Heart murmur    pt was told this, no echo   Hypertension  history no longer requires medication management   IDA (iron deficiency anemia)    Melasma    Perforated ulcer (HCC)    PUD (peptic ulcer disease)    Sleep apnea    mild, no CPAP ordered    SURGICAL HISTORY: Past Surgical History:  Procedure Laterality Date   ANKLE RECONSTRUCTION Left 09/09/2018   Procedure: BROSTRUM-GOULD LEFT;  Surgeon: Samara Deist, DPM;  Location: [facility];  Service: Podiatry;  Laterality: Left;   BREAST LUMPECTOMY WITH RADIOACTIVE SEED AND SENTINEL LYMPH NODE BIOPSY Left 02/25/2021   Procedure: LEFT BREAST LUMPECTOMY WITH RADIOACTIVE SEED AND SENTINEL LYMPH NODE BIOPSY;  Surgeon: Erroll Luna, MD;  Location: [facility];  Service: General;   Laterality: Left;   CHOLECYSTECTOMY     COLONOSCOPY     CYSTOSCOPY N/A 08/05/2016   Procedure: CYSTOSCOPY;  Surgeon: Bobbye Charleston, MD;  Location: [facility];  Service: Gynecology;  Laterality: N/A;   ENDOMETRIAL ABLATION W/ NOVASURE     GASTRIC BYPASS OPEN     LYSIS OF ADHESION N/A 08/05/2016   Procedure: LYSIS OF ADHESION;  Surgeon: Bobbye Charleston, MD;  Location: [facility];  Service: Gynecology;  Laterality: N/A;   perforated ulcer surgery     ROBOTIC ASSISTED SALPINGO OOPHERECTOMY Right 05/12/2017   Procedure: XI ROBOTIC ASSISTED RIGHT OOPHORECTOMY, Fulgeration of Endometriosis, Peritoneal Biopsy;  Surgeon: Bobbye Charleston, MD;  Location: [facility];  Service: Gynecology;  Laterality: Right;   ROBOTIC ASSISTED TOTAL HYSTERECTOMY WITH SALPINGECTOMY Bilateral 08/05/2016   Procedure: ROBOTIC ASSISTED TOTAL HYSTERECTOMY WITH SALPINGECTOMY;  Surgeon: Bobbye Charleston, MD;  Location: [facility];  Service: Gynecology;  Laterality: Bilateral;   TENDON REPAIR Left 09/09/2018   Procedure: TENOLYSIS, MULTIPLE;  Surgeon: Samara Deist, DPM;  Location: [facility];  Service: Podiatry;  Laterality: Left;   TONSILLECTOMY     UPPER GI ENDOSCOPY      I have reviewed the social history and family history with the patient and they are unchanged from previous note.  ALLERGIES:  is allergic to doxepin, ibuprofen, morphine, nsaids, tramadol, and trazodone.  MEDICATIONS:  Current Outpatient Medications  Medication Sig Dispense Refill   acetaminophen (TYLENOL) 500 MG tablet Take 1,000 mg by mouth every 6 (six) hours as needed for mild pain.     albuterol (PROVENTIL) (2.5 MG/3ML) 0.083% nebulizer solution Take 2.5 mg by nebulization every 6 (six) hours as needed for wheezing or shortness of breath.     albuterol (VENTOLIN HFA) 108 (90 Base) MCG/ACT inhaler Inhale 2 puffs into the lungs every 6 (six) hours as needed for wheezing or shortness of breath.     ALPRAZolam (XANAX XR) 1 MG 24 hr tablet Take 1 mg by mouth in the  morning and at bedtime.     ALPRAZolam (XANAX) 0.5 MG tablet Take 0.5 tablets by mouth 2 (two) times daily as needed for anxiety.   0   azelastine (ASTELIN) 0.1 % nasal spray Place 1 spray into both nostrils daily as needed for allergies or rhinitis.  12   buPROPion (WELLBUTRIN XL) 300 MG 24 hr tablet Take 300 mg by mouth daily.     cetirizine (ZYRTEC) 10 MG tablet Take 10 mg by mouth daily as needed (allergies).     Cyanocobalamin 1000 MCG/ML KIT Inject 1,000 mcg as directed every 30 (thirty) days.     desvenlafaxine (PRISTIQ) 50 MG 24 hr tablet Take 50 mg by mouth daily.     gabapentin (NEURONTIN) 100 MG capsule Take 200 mg by mouth at bedtime as needed (pain).  hydrochlorothiazide (MICROZIDE) 12.5 MG capsule Take 12.5 mg by mouth daily.     lamoTRIgine (LAMICTAL) 100 MG tablet Take 200 mg by mouth 2 (two) times daily.     olopatadine (PATANOL) 0.1 % ophthalmic solution 1 drop daily as needed for allergies.     ondansetron (ZOFRAN) 4 MG tablet Take 1 tablet (4 mg total) by mouth daily as needed for nausea or vomiting. 30 tablet 1   oxyCODONE (OXY IR/ROXICODONE) 5 MG immediate release tablet Take 1 tablet (5 mg total) by mouth every 6 (six) hours as needed for severe pain. 15 tablet 0   promethazine-dextromethorphan (PROMETHAZINE-DM) 6.25-15 MG/5ML syrup Take 5 mLs by mouth every 6 (six) hours as needed.     valACYclovir (VALTREX) 1000 MG tablet Take 1,000 mg by mouth daily as needed (cold sores).      No current facility-administered medications for this visit.    PHYSICAL EXAMINATION: ECOG PERFORMANCE STATUS: 0 - Asymptomatic  Vitals:   05/02/21 1334  BP: (!) 152/72  Pulse: 82  Resp: 18  Temp: 98.2 F (36.8 C)  SpO2: 93%   Wt Readings from Last 3 Encounters:  05/02/21 299 lb 7 oz (135.8 kg)  03/26/21 (!) 304 lb 2 oz (138 kg)  02/25/21 (!) 307 lb 9.6 oz (139.5 kg)     GENERAL:alert, no distress and comfortable SKIN: skin color, texture, turgor are normal, no rashes or  significant lesions EYES: normal, Conjunctiva are pink and non-injected, sclera clear  NEURO: alert & oriented x 3 with fluent speech, no focal motor/sensory deficits BREAST: (+) skin breakdown to left breast from radiation  LABORATORY DATA:  I have reviewed the data as listed CBC Latest Ref Rng & Units 05/02/2021 02/25/2021 02/18/2021  WBC 4.0 - 10.5 K/uL 5.9 - 5.7  Hemoglobin 12.0 - 15.0 g/dL 10.3(L) 13.6 10.8(L)  Hematocrit 36.0 - 46.0 % 33.6(L) 40.0 37.0  Platelets 150 - 400 K/uL 294 - 341     CMP Latest Ref Rng & Units 05/02/2021 02/25/2021 02/18/2021  Glucose 70 - 99 mg/dL 104(H) 96 102(H)  BUN 6 - 20 mg/dL '9 7 14  ' Creatinine 0.44 - 1.00 mg/dL 0.55 0.60 0.67  Sodium 135 - 145 mmol/L 138 140 138  Potassium 3.5 - 5.1 mmol/L 3.1(L) 4.3 2.9(L)  Chloride 98 - 111 mmol/L 96(L) 102 94(L)  CO2 22 - 32 mmol/L 35(H) - 35(H)  Calcium 8.9 - 10.3 mg/dL 8.9 - 8.8(L)  Total Protein 6.5 - 8.1 g/dL 7.1 - -  Total Bilirubin 0.3 - 1.2 mg/dL 0.4 - -  Alkaline Phos 38 - 126 U/L 236(H) - -  AST 15 - 41 U/L 34 - -  ALT 0 - 44 U/L 27 - -      RADIOGRAPHIC STUDIES: I have personally reviewed the radiological images as listed and agreed with the findings in the report. No results found.    Orders Placed This Encounter  Procedures   FSH-Follicle stimulating hormone    Standing Status:   Future    Number of Occurrences:   1    Standing Expiration Date:   05/02/2022   Estradiol    Standing Status:   Future    Number of Occurrences:   1    Standing Expiration Date:   05/02/2022   CBC with Differential/Platelet    Standing Status:   Standing    Number of Occurrences:   50    Standing Expiration Date:   05/02/2022   Comprehensive metabolic panel  Standing Status:   Standing    Number of Occurrences:   50    Standing Expiration Date:   05/02/2022   Amb Ref to Medical Weight Management    Referral Priority:   Routine    Referral Type:   Consultation    Number of Visits Requested:   1   All  questions were answered. The patient knows to call the clinic with any problems, questions or concerns. No barriers to learning was detected. The total time spent in the appointment was 30 minutes.     Truitt Merle, MD 05/02/2021  I, Wilburn Mylar, am acting as scribe for Truitt Merle, MD.   I have reviewed the above documentation for accuracy and completeness, and I agree with the above.

## 2021-05-03 ENCOUNTER — Encounter: Payer: Self-pay | Admitting: Hematology

## 2021-05-03 ENCOUNTER — Encounter: Payer: Self-pay | Admitting: Internal Medicine

## 2021-05-03 LAB — ESTRADIOL: Estradiol: 154 pg/mL

## 2021-05-03 LAB — FOLLICLE STIMULATING HORMONE: FSH: 12.5 m[IU]/mL

## 2021-05-03 MED ORDER — TAMOXIFEN CITRATE 20 MG PO TABS: 20.0000 mg | ORAL_TABLET | Freq: Every day | ORAL | 5 refills | Status: DC

## 2021-05-05 ENCOUNTER — Ambulatory Visit
Admission: RE | Admit: 2021-05-05 | Discharge: 2021-05-05 | Disposition: A | Discharge status: Home / Self Care | Payer: BC Managed Care – PPO | Source: Ambulatory Visit | Attending: Radiation Oncology | Admitting: Radiation Oncology

## 2021-05-05 ENCOUNTER — Other Ambulatory Visit: Payer: Self-pay

## 2021-05-05 ENCOUNTER — Encounter: Payer: Self-pay | Admitting: Radiation Oncology

## 2021-05-05 DIAGNOSIS — Z170 Estrogen receptor positive status [ER+]: Secondary | ICD-10-CM | POA: Diagnosis not present

## 2021-05-05 DIAGNOSIS — C50212 Malignant neoplasm of upper-inner quadrant of left female breast: Secondary | ICD-10-CM

## 2021-05-05 MED ORDER — SILVER SULFADIAZINE 1 % EX CREA: TOPICAL_CREAM | Freq: Two times a day (BID) | CUTANEOUS | Status: DC

## 2021-05-07 DIAGNOSIS — F332 Major depressive disorder, recurrent severe without psychotic features: Secondary | ICD-10-CM | POA: Diagnosis not present

## 2021-05-08 DIAGNOSIS — G894 Chronic pain syndrome: Secondary | ICD-10-CM | POA: Diagnosis not present

## 2021-05-08 DIAGNOSIS — F332 Major depressive disorder, recurrent severe without psychotic features: Secondary | ICD-10-CM | POA: Diagnosis not present

## 2021-05-08 DIAGNOSIS — Z5181 Encounter for therapeutic drug level monitoring: Secondary | ICD-10-CM | POA: Diagnosis not present

## 2021-05-12 DIAGNOSIS — F332 Major depressive disorder, recurrent severe without psychotic features: Secondary | ICD-10-CM | POA: Diagnosis not present

## 2021-05-19 DIAGNOSIS — F332 Major depressive disorder, recurrent severe without psychotic features: Secondary | ICD-10-CM | POA: Diagnosis not present

## 2021-05-21 ENCOUNTER — Encounter: Payer: Self-pay | Admitting: Internal Medicine

## 2021-05-21 ENCOUNTER — Encounter (HOSPITAL_COMMUNITY): Payer: Self-pay

## 2021-05-21 NOTE — Progress Notes (Signed)
° °                                                                                                                                                          °  Patient Name: Tracy [name] MRN: [id number] DOB: 1965/05/23 Referring Physician: Truitt Merle (Profile Not Attached) Date of Service: 05/05/2021 [facility] [place], [place]                                                        End Of Treatment Note  Diagnoses: C50.212-Malignant neoplasm of upper-inner quadrant of left female breast  Cancer Staging:  Cancer Staging  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 [facility]) Staging form: Breast, AJCC 8th Edition - Clinical stage from 02/12/2021: Stage IA (cT1b, cN0, cM0, G2, ER+, PR+, HER2-) - Signed by Eppie Gibson, MD on 02/12/2021 Stage prefix: Initial diagnosis Histologic grading system: 3 grade system - Pathologic stage from 02/25/2021: Stage IA (pT1b, pN0, cM0, G2, ER+, PR+, HER2-, Oncotype DX score: 3) - Signed by Truitt Merle, MD on 05/03/2021 Stage prefix: Initial diagnosis Multigene prognostic tests performed: Oncotype DX Recurrence score range: Less than 11 Histologic grading system: 3 grade system Residual tumor (R): R0 - None  Intent: Curative  Radiation Treatment Dates: 04/03/2021 through 05/05/2021 Site Technique Total Dose (Gy) Dose per Fx (Gy) Completed Fx Beam Energies  Breast, Left: Breast_L 3D 40.05/40.05 2.67 15/15 10X, 15X  Breast, Left: Breast_L_Bst 3D 10/10 2 5/5 6X, 10X   Narrative: The patient tolerated radiation therapy relatively well.   Plan: The patient will follow-up with radiation oncology in 50mo.  -----------------------------------  Eppie Gibson, MD

## 2021-05-26 ENCOUNTER — Other Ambulatory Visit: Payer: Self-pay

## 2021-05-26 ENCOUNTER — Emergency Department
Admission: EM | Admit: 2021-05-26 | Discharge: 2021-05-27 | Disposition: A | Discharge status: Home / Self Care | Payer: BC Managed Care – PPO | Attending: Emergency Medicine | Admitting: Emergency Medicine

## 2021-05-26 ENCOUNTER — Ambulatory Visit: Payer: BC Managed Care – PPO

## 2021-05-26 ENCOUNTER — Emergency Department: Payer: BC Managed Care – PPO

## 2021-05-26 DIAGNOSIS — S0081XA Abrasion of other part of head, initial encounter: Secondary | ICD-10-CM

## 2021-05-26 DIAGNOSIS — Z853 Personal history of malignant neoplasm of breast: Secondary | ICD-10-CM | POA: Insufficient documentation

## 2021-05-26 DIAGNOSIS — L811 Chloasma: Secondary | ICD-10-CM | POA: Diagnosis not present

## 2021-05-26 DIAGNOSIS — Y908 Blood alcohol level of 240 mg/100 ml or more: Secondary | ICD-10-CM | POA: Insufficient documentation

## 2021-05-26 DIAGNOSIS — S0003XA Contusion of scalp, initial encounter: Secondary | ICD-10-CM | POA: Diagnosis not present

## 2021-05-26 DIAGNOSIS — X58XXXA Exposure to other specified factors, initial encounter: Secondary | ICD-10-CM | POA: Diagnosis not present

## 2021-05-26 DIAGNOSIS — F10929 Alcohol use, unspecified with intoxication, unspecified: Secondary | ICD-10-CM | POA: Diagnosis not present

## 2021-05-26 DIAGNOSIS — S025XXA Fracture of tooth (traumatic), initial encounter for closed fracture: Secondary | ICD-10-CM | POA: Insufficient documentation

## 2021-05-26 DIAGNOSIS — R410 Disorientation, unspecified: Secondary | ICD-10-CM | POA: Diagnosis not present

## 2021-05-26 DIAGNOSIS — D649 Anemia, unspecified: Secondary | ICD-10-CM | POA: Insufficient documentation

## 2021-05-26 DIAGNOSIS — S0990XA Unspecified injury of head, initial encounter: Secondary | ICD-10-CM | POA: Diagnosis not present

## 2021-05-26 DIAGNOSIS — F10129 Alcohol abuse with intoxication, unspecified: Secondary | ICD-10-CM | POA: Insufficient documentation

## 2021-05-26 DIAGNOSIS — R456 Violent behavior: Secondary | ICD-10-CM | POA: Diagnosis not present

## 2021-05-26 DIAGNOSIS — E876 Hypokalemia: Secondary | ICD-10-CM | POA: Diagnosis not present

## 2021-05-26 DIAGNOSIS — I10 Essential (primary) hypertension: Secondary | ICD-10-CM | POA: Diagnosis not present

## 2021-05-26 DIAGNOSIS — F10920 Alcohol use, unspecified with intoxication, uncomplicated: Secondary | ICD-10-CM

## 2021-05-26 DIAGNOSIS — M47812 Spondylosis without myelopathy or radiculopathy, cervical region: Secondary | ICD-10-CM | POA: Diagnosis not present

## 2021-05-26 DIAGNOSIS — F1010 Alcohol abuse, uncomplicated: Secondary | ICD-10-CM

## 2021-05-26 DIAGNOSIS — E538 Deficiency of other specified B group vitamins: Secondary | ICD-10-CM | POA: Diagnosis not present

## 2021-05-26 DIAGNOSIS — W19XXXA Unspecified fall, initial encounter: Secondary | ICD-10-CM | POA: Diagnosis not present

## 2021-05-26 LAB — BASIC METABOLIC PANEL
Anion gap: 8 (ref 5–15)
BUN: 13 mg/dL (ref 6–20)
CO2: 34 mmol/L — ABNORMAL HIGH (ref 22–32)
Calcium: 8.9 mg/dL (ref 8.9–10.3)
Chloride: 95 mmol/L — ABNORMAL LOW (ref 98–111)
Creatinine, Ser: 0.53 mg/dL (ref 0.44–1.00)
GFR, Estimated: >60 mL/min (ref >60)
Glucose, Bld: 110 mg/dL — ABNORMAL HIGH (ref 70–99)
Potassium: 3 mmol/L — ABNORMAL LOW (ref 3.5–5.1)
Sodium: 137 mmol/L (ref 135–145)

## 2021-05-26 LAB — ACETAMINOPHEN LEVEL: Acetaminophen (Tylenol), Serum: <10 ug/mL — ABNORMAL LOW (ref 10–30)

## 2021-05-26 LAB — CBC
HCT: 34.6 % — ABNORMAL LOW (ref 36.0–46.0)
Hemoglobin: 10.2 g/dL — ABNORMAL LOW (ref 12.0–15.0)
MCH: 22.3 pg — ABNORMAL LOW (ref 26.0–34.0)
MCHC: 29.5 g/dL — ABNORMAL LOW (ref 30.0–36.0)
MCV: 75.5 fL — ABNORMAL LOW (ref 80.0–100.0)
Platelets: 234 10*3/uL (ref 150–400)
RBC: 4.58 MIL/uL (ref 3.87–5.11)
RDW: 17.4 % — ABNORMAL HIGH (ref 11.5–15.5)
WBC: 4.7 10*3/uL (ref 4.0–10.5)
nRBC: 0 % (ref 0.0–0.2)

## 2021-05-26 LAB — PROTIME-INR
INR: 0.9 (ref 0.8–1.2)
Prothrombin Time: 12.6 seconds (ref 11.4–15.2)

## 2021-05-26 LAB — SALICYLATE LEVEL: Salicylate Lvl: <7 mg/dL — ABNORMAL LOW (ref 7.0–30.0)

## 2021-05-26 LAB — ETHANOL: Alcohol, Ethyl (B): 365 mg/dL (ref <10)

## 2021-05-26 MED ORDER — LORAZEPAM 1 MG PO TABS: 1.0000 mg | ORAL_TABLET | ORAL | Status: DC | PRN

## 2021-05-26 MED ORDER — LORAZEPAM 2 MG/ML IJ SOLN: 1.0000 mg | INTRAMUSCULAR | Status: DC | PRN

## 2021-05-26 MED ORDER — ADULT MULTIVITAMIN W/MINERALS CH: 1.0000 | ORAL_TABLET | Freq: Every day | ORAL | Status: DC

## 2021-05-26 MED ORDER — SODIUM CHLORIDE 0.9 % IV BOLUS
500.0000 mL | Freq: Once | INTRAVENOUS | Status: AC
  Administered 2021-05-26: 500 mL via INTRAVENOUS

## 2021-05-26 MED ORDER — THIAMINE HCL 100 MG PO TABS: 100.0000 mg | ORAL_TABLET | Freq: Every day | ORAL | Status: DC

## 2021-05-26 MED ORDER — FOLIC ACID 1 MG PO TABS: 1.0000 mg | ORAL_TABLET | Freq: Every day | ORAL | Status: DC

## 2021-05-26 MED ORDER — THIAMINE HCL 100 MG/ML IJ SOLN: 100.0000 mg | Freq: Every day | INTRAMUSCULAR | Status: DC

## 2021-05-26 NOTE — ED Notes (Signed)
Pt requesting phone. Phone given and assisted with phone number ? ?

## 2021-05-26 NOTE — ED Notes (Signed)
Pt's husband asking to speak with EDP. EDP made aware and speaking with him now. ?
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
## 2021-05-26 NOTE — ED Triage Notes (Signed)
Pt was found out at [employer] hanging out of car  with a top right chipped tooth and bruised and red area to top right forehead. Pt is slurring words, unable to follow commands. Pt denies pain and SI. ?

## 2021-05-26 NOTE — ED Notes (Signed)
Pt asking for husband. Pt husband was called he states he would be here in a hour.  ?

## 2021-05-27 MED ORDER — ACETAMINOPHEN 500 MG PO TABS
1000.0000 mg | ORAL_TABLET | Freq: Four times a day (QID) | ORAL | Status: DC | PRN
  Administered 2021-05-27: 1000 mg via ORAL
  Filled 2021-05-27: qty 2

## 2021-05-27 NOTE — ED Provider Notes (Signed)
Patient is now clinically sober.  Ambulating with no difficulty, tolerating p.o.  Will be discharged to the care of her husband. ?  ?Rudene Re, MD ?05/27/21 [phone] ? ?

## 2021-05-27 NOTE — ED Notes (Signed)
Called and spoke with pt's husband to inform him of pt's disposition; he verbalized he is on the way to pick up pt ?

## 2021-05-27 NOTE — ED Notes (Signed)
Pt up to bathroom and back to bed with little assistance; pt requesting tylenol; EDP made aware and states pt can be discharged and wait in waiting room; pt verbalizes understanding ?

## 2021-05-27 NOTE — ED Notes (Signed)
Called and spoke with pt's husband who stated the doctor told him he could leave pt here til early morning; this RN informed husband he would need to come in sooner if at all possible; husband stated he would come as soon as he could ?

## 2021-05-28 DIAGNOSIS — F332 Major depressive disorder, recurrent severe without psychotic features: Secondary | ICD-10-CM | POA: Diagnosis not present

## 2021-05-29 ENCOUNTER — Ambulatory Visit: Payer: BC Managed Care – PPO | Admitting: Rehabilitation

## 2021-05-30 DIAGNOSIS — K080 Exfoliation of teeth due to systemic causes: Secondary | ICD-10-CM | POA: Diagnosis not present

## 2021-06-02 DIAGNOSIS — F332 Major depressive disorder, recurrent severe without psychotic features: Secondary | ICD-10-CM | POA: Diagnosis not present

## 2021-06-06 DIAGNOSIS — F332 Major depressive disorder, recurrent severe without psychotic features: Secondary | ICD-10-CM | POA: Diagnosis not present

## 2021-06-09 ENCOUNTER — Other Ambulatory Visit: Payer: Self-pay

## 2021-06-09 ENCOUNTER — Ambulatory Visit: Payer: BC Managed Care – PPO | Attending: Surgery

## 2021-06-09 VITALS — Wt 301.2 lb

## 2021-06-09 DIAGNOSIS — F332 Major depressive disorder, recurrent severe without psychotic features: Secondary | ICD-10-CM | POA: Diagnosis not present

## 2021-06-09 DIAGNOSIS — Z483 Aftercare following surgery for neoplasm: Secondary | ICD-10-CM | POA: Insufficient documentation

## 2021-06-09 NOTE — Therapy (Signed)
?OUTPATIENT PHYSICAL THERAPY SOZO SCREENING NOTE ? ? ?Patient Name: Tracy [name] ?MRN: [id number] ?DOB:[birth year], 56 y.o., female ?Today's Date: 06/09/2021 ? ?PCP: [facility] ?REFERRING PROVIDER: Shari Prows, [facility]* ? ? PT End of Session - 06/09/21 0855   ? ? Visit Number 2   # unchanged due to screen only  ? PT Start Time [phone]   ? PT Stop Time 0902   ? PT Time Calculation (min) 11 min   ? Activity Tolerance Patient tolerated treatment well   ? Behavior During Therapy [facility] for tasks assessed/performed   ? ?  ?  ? ?  ? ? ?Past Medical History:  ?Diagnosis Date  ? Anemia   ? Anxiety   ? Anxiety and depression   ? Arthritis   ? left knee  ? Asthma   ? hardly uses inhaler  ? Depression   ? Diverticulosis   ? Fatty liver 2012  ? pt states she was told this once  ? Headache   ? Migraines  ? Heart murmur   ? pt was told this, no echo  ? Hypertension   ? history no longer requires medication management  ? IDA (iron deficiency anemia)   ? Melasma   ? Perforated ulcer ([place])   ? PUD (peptic ulcer disease)   ? Sleep apnea   ? mild, no CPAP ordered  ? ?Past Surgical History:  ?Procedure Laterality Date  ? ANKLE RECONSTRUCTION Left 09/09/2018  ? Procedure: BROSTRUM-GOULD LEFT;  Surgeon: Samara Deist, DPM;  Location: [facility];  Service: Podiatry;  Laterality: Left;  ? BREAST LUMPECTOMY WITH RADIOACTIVE SEED AND SENTINEL LYMPH NODE BIOPSY Left 02/25/2021  ? Procedure: LEFT BREAST LUMPECTOMY WITH RADIOACTIVE SEED AND SENTINEL LYMPH NODE BIOPSY;  Surgeon: Erroll Luna, MD;  Location: [place];  Service: General;  Laterality: Left;  ? CHOLECYSTECTOMY    ? COLONOSCOPY    ? CYSTOSCOPY N/A 08/05/2016  ? Procedure: CYSTOSCOPY;  Surgeon: Bobbye Charleston, MD;  Location: [place] [facility];  Service: Gynecology;  Laterality: N/A;  ? ENDOMETRIAL ABLATION W/ NOVASURE    ? GASTRIC BYPASS OPEN    ? LYSIS OF ADHESION N/A 08/05/2016  ? Procedure: LYSIS OF ADHESION;  Surgeon: Bobbye Charleston, MD;  Location: [facility] [facility];  Service: Gynecology;   Laterality: N/A;  ? perforated ulcer surgery    ? ROBOTIC ASSISTED SALPINGO OOPHERECTOMY Right 05/12/2017  ? Procedure: XI ROBOTIC ASSISTED RIGHT OOPHORECTOMY, Fulgeration of Endometriosis, Peritoneal Biopsy;  Surgeon: Bobbye Charleston, MD;  Location: [facility];  Service: Gynecology;  Laterality: Right;  ? ROBOTIC ASSISTED TOTAL HYSTERECTOMY WITH SALPINGECTOMY Bilateral 08/05/2016  ? Procedure: ROBOTIC ASSISTED TOTAL HYSTERECTOMY WITH SALPINGECTOMY;  Surgeon: Bobbye Charleston, MD;  Location: [facility] [place] [facility];  Service: Gynecology;  Laterality: Bilateral;  ? TENDON REPAIR Left 09/09/2018  ? Procedure: TENOLYSIS, MULTIPLE;  Surgeon: Samara Deist, DPM;  Location: [facility];  Service: Podiatry;  Laterality: Left;  ? TONSILLECTOMY    ? UPPER GI ENDOSCOPY    ? ?Patient Active Problem List  ? Diagnosis Date Noted  ? 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 Lymphedema 10/04/2017  ? Postoperative state 08/05/2016  ? Anxiety and depression 01/29/2015  ? Airway hyperreactivity 01/29/2015  ? Gastroduodenal ulcer 01/29/2015  ? Other iron deficiency anemia 01/29/2015  ? H/O infectious disease 12/01/2013  ? DD (diverticular disease) 10/02/2013  ? Cardiac murmur 09/12/2013  ? Chloasma 09/12/2013  ? Absolute anemia 01/09/2013  ? ? ?REFERRING DIAG: left breast cancer at risk for lymphedema ? ?  THERAPY DIAG:  ?Aftercare following surgery for neoplasm ? ?PERTINENT HISTORY: 01/15/2021 left breast cancer ER/PR +, Her2 - Ki 1% Pt post left lumpectomy 12/13/202 with 0/3 lymph nodes removed.  Will have radiation starting next wednesday.  No chemotherapy. Seroma complication after surgery  ? ?PRECAUTIONS: left UE Lymphedema risk, None ? ?SUBJECTIVE: Pt returns for her 3 month L-Dex screen.  ? ?PAIN:  ?Are you having pain? No ? ?SOZO SCREENING: ?Patient was assessed today using the SOZO machine to determine the lymphedema index score. This was compared to her baseline score. It was determined that she is within the recommended  range when compared to her baseline and no further action is needed at this time. She will continue SOZO screenings. These are done every 3 months for 2 years post operatively followed by every 6 months for 2 years, and then annually. ? ? ?Plan: Cont every 3 month L-Dex screens for up to 2 years (~02/26/2023) ? ? ?Otelia Limes, PTA ?06/09/2021, 9:04 AM ? ?  ? ?

## 2021-06-11 ENCOUNTER — Telehealth: Payer: Self-pay

## 2021-06-11 DIAGNOSIS — F332 Major depressive disorder, recurrent severe without psychotic features: Secondary | ICD-10-CM | POA: Diagnosis not present

## 2021-06-11 NOTE — Telephone Encounter (Signed)
Pt LVM message stating her PCP has her on Wellbutrin and she currently taking Tamoxifen.  Her PCP wants her to follow-up with Dr. Burr Medico regarding the possible drug interaction.  Notified Dr. Burr Medico of the pt's concern regarding the 2 medications. ?

## 2021-06-16 DIAGNOSIS — G8929 Other chronic pain: Secondary | ICD-10-CM | POA: Diagnosis not present

## 2021-06-16 DIAGNOSIS — M25561 Pain in right knee: Secondary | ICD-10-CM | POA: Diagnosis not present

## 2021-06-16 DIAGNOSIS — M25562 Pain in left knee: Secondary | ICD-10-CM | POA: Diagnosis not present

## 2021-06-16 DIAGNOSIS — F332 Major depressive disorder, recurrent severe without psychotic features: Secondary | ICD-10-CM | POA: Diagnosis not present

## 2021-06-16 DIAGNOSIS — M170 Bilateral primary osteoarthritis of knee: Secondary | ICD-10-CM | POA: Diagnosis not present

## 2021-06-17 DIAGNOSIS — H8110 Benign paroxysmal vertigo, unspecified ear: Secondary | ICD-10-CM | POA: Diagnosis not present

## 2021-06-17 DIAGNOSIS — W010XXA Fall on same level from slipping, tripping and stumbling without subsequent striking against object, initial encounter: Secondary | ICD-10-CM | POA: Diagnosis not present

## 2021-06-17 DIAGNOSIS — Y92481 Parking lot as the place of occurrence of the external cause: Secondary | ICD-10-CM | POA: Diagnosis not present

## 2021-06-17 DIAGNOSIS — F332 Major depressive disorder, recurrent severe without psychotic features: Secondary | ICD-10-CM | POA: Diagnosis not present

## 2021-06-17 DIAGNOSIS — G894 Chronic pain syndrome: Secondary | ICD-10-CM | POA: Diagnosis not present

## 2021-06-17 DIAGNOSIS — G44211 Episodic tension-type headache, intractable: Secondary | ICD-10-CM | POA: Diagnosis not present

## 2021-06-19 DIAGNOSIS — F332 Major depressive disorder, recurrent severe without psychotic features: Secondary | ICD-10-CM | POA: Diagnosis not present

## 2021-06-25 DIAGNOSIS — F332 Major depressive disorder, recurrent severe without psychotic features: Secondary | ICD-10-CM | POA: Diagnosis not present

## 2021-06-26 DIAGNOSIS — E538 Deficiency of other specified B group vitamins: Secondary | ICD-10-CM | POA: Diagnosis not present

## 2021-06-27 ENCOUNTER — Ambulatory Visit: Payer: BC Managed Care – PPO | Admitting: Radiation Oncology

## 2021-06-30 DIAGNOSIS — F332 Major depressive disorder, recurrent severe without psychotic features: Secondary | ICD-10-CM | POA: Diagnosis not present

## 2021-07-02 DIAGNOSIS — M170 Bilateral primary osteoarthritis of knee: Secondary | ICD-10-CM | POA: Diagnosis not present

## 2021-07-04 ENCOUNTER — Ambulatory Visit: Payer: BC Managed Care – PPO | Admitting: Radiation Oncology

## 2021-07-07 DIAGNOSIS — F332 Major depressive disorder, recurrent severe without psychotic features: Secondary | ICD-10-CM | POA: Diagnosis not present

## 2021-07-10 DIAGNOSIS — M170 Bilateral primary osteoarthritis of knee: Secondary | ICD-10-CM | POA: Diagnosis not present

## 2021-07-14 DIAGNOSIS — F332 Major depressive disorder, recurrent severe without psychotic features: Secondary | ICD-10-CM | POA: Diagnosis not present

## 2021-07-16 DIAGNOSIS — M170 Bilateral primary osteoarthritis of knee: Secondary | ICD-10-CM | POA: Diagnosis not present

## 2021-07-21 DIAGNOSIS — F332 Major depressive disorder, recurrent severe without psychotic features: Secondary | ICD-10-CM | POA: Diagnosis not present

## 2021-07-22 DIAGNOSIS — R7303 Prediabetes: Secondary | ICD-10-CM | POA: Diagnosis not present

## 2021-07-22 DIAGNOSIS — Z713 Dietary counseling and surveillance: Secondary | ICD-10-CM | POA: Diagnosis not present

## 2021-07-22 DIAGNOSIS — R632 Polyphagia: Secondary | ICD-10-CM | POA: Diagnosis not present

## 2021-07-28 DIAGNOSIS — F332 Major depressive disorder, recurrent severe without psychotic features: Secondary | ICD-10-CM | POA: Diagnosis not present

## 2021-07-30 ENCOUNTER — Other Ambulatory Visit: Payer: Self-pay

## 2021-07-30 ENCOUNTER — Inpatient Hospital Stay: Payer: BC Managed Care – PPO | Attending: Radiation Oncology | Admitting: Adult Health

## 2021-07-30 ENCOUNTER — Inpatient Hospital Stay: Payer: BC Managed Care – PPO

## 2021-07-30 VITALS — BP 135/68 | HR 72 | Temp 97.7°F | Resp 16 | Ht 66.0 in | Wt 303.4 lb

## 2021-07-30 DIAGNOSIS — Z79899 Other long term (current) drug therapy: Secondary | ICD-10-CM | POA: Insufficient documentation

## 2021-07-30 DIAGNOSIS — C50212 Malignant neoplasm of upper-inner quadrant of left female breast: Secondary | ICD-10-CM | POA: Diagnosis not present

## 2021-07-30 DIAGNOSIS — Z170 Estrogen receptor positive status [ER+]: Secondary | ICD-10-CM | POA: Insufficient documentation

## 2021-07-30 DIAGNOSIS — Z79810 Long term (current) use of selective estrogen receptor modulators (SERMs): Secondary | ICD-10-CM | POA: Diagnosis not present

## 2021-07-30 DIAGNOSIS — Z923 Personal history of irradiation: Secondary | ICD-10-CM | POA: Diagnosis not present

## 2021-07-30 LAB — URINALYSIS, COMPLETE (UACMP) WITH MICROSCOPIC
Bilirubin Urine: NEGATIVE
Glucose, UA: NEGATIVE mg/dL
Hgb urine dipstick: NEGATIVE
Ketones, ur: NEGATIVE mg/dL
Leukocytes,Ua: NEGATIVE
Nitrite: NEGATIVE
Protein, ur: NEGATIVE mg/dL
Specific Gravity, Urine: 1.017 (ref 1.005–1.030)
pH: 5 (ref 5.0–8.0)

## 2021-07-30 LAB — COMPREHENSIVE METABOLIC PANEL
ALT: 24 U/L (ref 0–44)
AST: 14 U/L — ABNORMAL LOW (ref 15–41)
Albumin: 3.7 g/dL (ref 3.5–5.0)
Alkaline Phosphatase: 96 U/L (ref 38–126)
Anion gap: 3 — ABNORMAL LOW (ref 5–15)
BUN: 16 mg/dL (ref 6–20)
CO2: 35 mmol/L — ABNORMAL HIGH (ref 22–32)
Calcium: 9.1 mg/dL (ref 8.9–10.3)
Chloride: 104 mmol/L (ref 98–111)
Creatinine, Ser: 0.63 mg/dL (ref 0.44–1.00)
GFR, Estimated: >60 mL/min (ref >60)
Glucose, Bld: 96 mg/dL (ref 70–99)
Potassium: 3.6 mmol/L (ref 3.5–5.1)
Sodium: 142 mmol/L (ref 135–145)
Total Bilirubin: 0.2 mg/dL — ABNORMAL LOW (ref 0.3–1.2)
Total Protein: 6.9 g/dL (ref 6.5–8.1)

## 2021-07-30 LAB — CBC WITH DIFFERENTIAL/PLATELET
Abs Immature Granulocytes: 0.01 10*3/uL (ref 0.00–0.07)
Basophils Absolute: 0.1 10*3/uL (ref 0.0–0.1)
Basophils Relative: 1 %
Eosinophils Absolute: 0.2 10*3/uL (ref 0.0–0.5)
Eosinophils Relative: 4 %
HCT: 36.8 % (ref 36.0–46.0)
Hemoglobin: 11.2 g/dL — ABNORMAL LOW (ref 12.0–15.0)
Immature Granulocytes: 0 %
Lymphocytes Relative: 33 %
Lymphs Abs: 1.8 10*3/uL (ref 0.7–4.0)
MCH: 22.8 pg — ABNORMAL LOW (ref 26.0–34.0)
MCHC: 30.4 g/dL (ref 30.0–36.0)
MCV: 74.9 fL — ABNORMAL LOW (ref 80.0–100.0)
Monocytes Absolute: 0.3 10*3/uL (ref 0.1–1.0)
Monocytes Relative: 6 %
Neutro Abs: 2.9 10*3/uL (ref 1.7–7.7)
Neutrophils Relative %: 56 %
Platelets: 285 10*3/uL (ref 150–400)
RBC: 4.91 MIL/uL (ref 3.87–5.11)
RDW: 19.5 % — ABNORMAL HIGH (ref 11.5–15.5)
WBC: 5.3 10*3/uL (ref 4.0–10.5)
nRBC: 0 % (ref 0.0–0.2)

## 2021-07-30 NOTE — Progress Notes (Signed)
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 ([place]) Staging form: Breast, AJCC 8th Edition - Clinical stage from 02/12/2021: Stage IA (cT1b, cN0, cM0, G2, ER+, PR+, HER2-) - Signed by Eppie Gibson, MD on 02/12/2021 Stage prefix: Initial diagnosis Histologic grading system: 3 grade system - Pathologic stage from 02/25/2021: Stage IA (pT1b, pN0, cM0, G2, ER+, PR+, HER2-, Oncotype DX score: 3) - Signed by Truitt Merle, MD on 05/03/2021 Stage prefix: Initial diagnosis Multigene prognostic tests performed: Oncotype DX Recurrence score range: Less than 11 Histologic grading system: 3 grade system Residual tumor (R): R0 - None     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 Suspicious left breast mass corresponding with the screening mammographic findings. 2. No suspicious left axillary lymphadenopathy.   RECOMMENDATION: Ultrasound-guided biopsy of the left breast.   01/23/2021 Procedure   ULTRASOUND GUIDED LEFT BREAST CORE NEEDLE BIOPSY   IMPRESSION: Ultrasound guided biopsy of a mass in the left breast at 11 o'clock. No apparent complications.   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 Based on the biopsy, the carcinoma appears Nottingham grade 2 of 3 and measures 0.4 cm in greatest linear extent.  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 BREAST, LEFT ADDITIONAL ANTERIOSUPERIOR MARGIN, EXCISION:  - Benign breast parenchyma, negative for carcinoma   C. BREAST, LEFT ADDITIONAL MEDIOPOSTERIOR MARGIN, EXCISION:  - Benign breast parenchyma, negative for carcinoma   D. BREAST, LEFT ADDITIONAL INFERIOLATERAL MARGIN, EXCISION:  - Benign breast parenchyma, negative for carcinoma   E. LYMPH NODE, LEFT AXILLARY, SENTINEL, EXCISION:  - Lymph node, negative for carcinoma (0/1)   F. LYMPH NODE, LEFT AXILLARY, SENTINEL, EXCISION:  - Lymph node, negative for carcinoma (0/1)   G. LYMPH NODE, LEFT AXILLARY, SENTINEL, EXCISION:  - Lymph node, negative for carcinoma (0/1)    02/25/2021 Oncotype testing   Recurrence score of 4, predicts a risk of recurrence outside the breast over the next 9 years of 3%    02/25/2021 Cancer Staging   Staging form: Breast, AJCC 8th Edition - Pathologic stage from 02/25/2021: Stage IA (pT1b, pN0, cM0, G2, ER+, PR+, HER2-, Oncotype DX score: 3) - Signed by Truitt Merle, MD on 05/03/2021 Stage prefix: Initial diagnosis Multigene prognostic tests performed: Oncotype DX Recurrence score  range: Less than 11 Histologic grading system: 3 grade system Residual tumor (R): R0 - None    04/03/2021 -  05/05/2021 Radiation Therapy   Site Technique Total Dose (Gy) Dose per Fx (Gy) Completed Fx Beam Energies  Breast, Left: Breast_L 3D 40.05/40.05 2.67 15/15 10X, 15X  Breast, Left: Breast_L_Bst 3D 10/10 2 5/5 6X, 10X       INTERVAL HISTORY:  Tracy [name] to review her survivorship care plan detailing her treatment course for breast cancer, as well as monitoring long-term side effects of that treatment, education regarding health maintenance, screening, and overall wellness and health promotion.     Overall, Tracy [name] reports feeling quite well.  She is taking tamoxifen daily.  She struggles with fatigue, hot flashes since starting this.    REVIEW OF SYSTEMS:  Review of Systems  Constitutional:  Positive for fatigue. Negative for appetite change, chills, fever and unexpected weight change.  HENT:   Negative for hearing loss, lump/mass and trouble swallowing.   Eyes:  Negative for eye problems and icterus.  Respiratory:  Negative for chest tightness, cough and shortness of breath.   Cardiovascular:  Negative for chest pain, leg swelling and palpitations.  Gastrointestinal:  Negative for abdominal distention, abdominal pain, constipation, diarrhea, nausea and vomiting.  Endocrine: Positive for hot flashes.  Genitourinary:  Negative for difficulty urinating.   Musculoskeletal:  Negative for arthralgias.  Skin:  Negative for itching and rash.  Neurological:  Negative for dizziness, extremity weakness, headaches and numbness.  Hematological:  Negative for adenopathy. Does not bruise/bleed easily.  Psychiatric/Behavioral:  Negative for depression. The patient is not nervous/anxious.   Breast: Denies any new nodularity, masses, tenderness, nipple changes, or nipple discharge.      ONCOLOGY TREATMENT TEAM:  1. Surgeon:  Dr. Brantley Stage at [facility] 2. Medical Oncologist: Dr. Burr Medico  3. Radiation Oncologist: Dr. Isidore Moos    PAST MEDICAL/SURGICAL HISTORY:  Past Medical History:   Diagnosis Date   Anemia    Anxiety    Anxiety and depression    Arthritis    left knee   Asthma    hardly uses inhaler   Depression    Diverticulosis    Fatty liver 2012   pt states she was told this once   Headache    Migraines   Heart murmur    pt was told this, no echo   Hypertension    history no longer requires medication management   IDA (iron deficiency anemia)    Melasma    Perforated ulcer ([facility])    PUD (peptic ulcer disease)    Sleep apnea    mild, no CPAP ordered   Past Surgical History:  Procedure Laterality Date   ANKLE RECONSTRUCTION Left 09/09/2018   Procedure: BROSTRUM-GOULD LEFT;  Surgeon: Samara Deist, DPM;  Location: [facility];  Service: Podiatry;  Laterality: Left;   BREAST LUMPECTOMY WITH RADIOACTIVE SEED AND SENTINEL LYMPH NODE BIOPSY Left 02/25/2021   Procedure: LEFT BREAST LUMPECTOMY WITH RADIOACTIVE SEED AND SENTINEL LYMPH NODE BIOPSY;  Surgeon: Erroll Luna, MD;  Location: [place];  Service: General;  Laterality: Left;   CHOLECYSTECTOMY     COLONOSCOPY     CYSTOSCOPY N/A 08/05/2016   Procedure: CYSTOSCOPY;  Surgeon: Bobbye Charleston, MD;  Location: [facility] [place];  Service: Gynecology;  Laterality: N/A;   ENDOMETRIAL ABLATION W/ NOVASURE     GASTRIC BYPASS OPEN     LYSIS OF ADHESION N/A 08/05/2016   Procedure: LYSIS OF ADHESION;  Surgeon: Bobbye Charleston, MD;  Location: [facility];  Service: Gynecology;  Laterality: N/A;   perforated ulcer surgery     ROBOTIC ASSISTED SALPINGO OOPHERECTOMY Right 05/12/2017   Procedure: XI ROBOTIC ASSISTED RIGHT OOPHORECTOMY, Fulgeration of Endometriosis, Peritoneal Biopsy;  Surgeon: Bobbye Charleston, MD;  Location: [facility];  Service: Gynecology;  Laterality: Right;   ROBOTIC ASSISTED TOTAL HYSTERECTOMY WITH SALPINGECTOMY Bilateral 08/05/2016   Procedure: ROBOTIC ASSISTED TOTAL HYSTERECTOMY WITH SALPINGECTOMY;  Surgeon: Bobbye Charleston, MD;  Location: [facility];  Service: Gynecology;  Laterality: Bilateral;   TENDON REPAIR  Left 09/09/2018   Procedure: TENOLYSIS, MULTIPLE;  Surgeon: Samara Deist, DPM;  Location: [facility];  Service: Podiatry;  Laterality: Left;   TONSILLECTOMY     UPPER GI ENDOSCOPY       ALLERGIES:  Allergies  Allergen Reactions   Doxepin Other (See Comments)    sweats   Ibuprofen Other (See Comments)    Perforated ulcer Perforated ulcer Perforated ulcer   Morphine Hives   Nsaids Other (See Comments)    H/o ulcer   Tramadol Nausea Only   Trazodone Other (See Comments)    Didn't work for sleep Causes headaches     CURRENT MEDICATIONS:  Outpatient Encounter Medications as of 07/30/2021  Medication Sig   acetaminophen (TYLENOL) 500 MG tablet Take 1,000 mg by mouth every 6 (six) hours as needed for mild pain.   albuterol (PROVENTIL) (2.5 MG/3ML) 0.083% nebulizer solution Take 2.5 mg by nebulization every 6 (six) hours as needed for wheezing or shortness of breath.   albuterol (VENTOLIN HFA) 108 (90 Base) MCG/ACT inhaler Inhale 2 puffs into the lungs every 6 (six) hours as needed for wheezing or shortness of breath.   ALPRAZolam (XANAX XR) 1 MG 24 hr tablet Take 1 mg by mouth in the morning and at bedtime.   ALPRAZolam (XANAX) 0.5 MG tablet Take 0.5 tablets by mouth 2 (two) times daily as needed for anxiety.    azelastine (ASTELIN) 0.1 % nasal spray Place 1 spray into both nostrils daily as needed for allergies or rhinitis.   buPROPion (WELLBUTRIN XL) 300 MG 24 hr tablet Take 300 mg by mouth daily.   cetirizine (ZYRTEC) 10 MG tablet Take 10 mg by mouth daily as needed (allergies).   Cyanocobalamin 1000 MCG/ML KIT Inject 1,000 mcg as directed every 30 (thirty) days.   desvenlafaxine (PRISTIQ) 50 MG 24 hr tablet Take 50 mg by mouth daily.   hydrochlorothiazide (MICROZIDE) 12.5 MG capsule Take 12.5 mg by mouth daily.   lamoTRIgine (LAMICTAL) 100 MG tablet Take 200 mg by mouth 2 (two) times daily.   methylphenidate (RITALIN) 5 MG tablet Take 5 mg by mouth 2 (two) times daily.    naltrexone (DEPADE) 50 MG tablet Take 50 mg by mouth at bedtime.   ondansetron (ZOFRAN) 4 MG tablet Take 1 tablet (4 mg total) by mouth daily as needed for nausea or vomiting. (Patient not taking: Reported on 05/26/2021)   SPRAVATO, 84 MG DOSE, 28 MG/DEVICE SOPK Place into both nostrils.   tamoxifen (NOLVADEX) 20 MG tablet Take 1 tablet (20 mg total) by mouth daily.   valACYclovir (VALTREX) 1000 MG tablet Take 1,000 mg by mouth daily as needed (cold sores).    No facility-administered encounter medications on file as of 07/30/2021.     ONCOLOGIC FAMILY HISTORY:  Family History  Problem Relation Age of Onset   Pancreatic cancer Sister 7       died   Breast cancer Maternal Aunt 64   COPD Mother  Anxiety disorder Mother    Depression Mother    Diabetes type II Mother    Obesity Mother    Crohn's disease Mother    Coronary artery disease Father    Diabetes type II Father    Hypertension Father    Obesity Sister    Depression Maternal Grandmother    Obesity Maternal Grandmother    Cancer Maternal Grandmother    Coronary artery disease Maternal Grandmother    Coronary artery disease Paternal Grandfather    Leukemia Paternal Grandfather    Glaucoma Paternal Grandmother    Hypertension Paternal Grandmother    Osteoarthritis Paternal Grandmother    Skin cancer Paternal Grandmother      GENETIC COUNSELING/TESTING: See above if indicated  SOCIAL HISTORY:  Social History   Socioeconomic History   Marital status: Married    Spouse name: Not on file   Number of children: Not on file   Years of education: Not on file   Highest education level: Not on file  Occupational History   Not on file  Tobacco Use   Smoking status: Never   Smokeless tobacco: Never  Vaping Use   Vaping Use: Never used  Substance and Sexual Activity   Alcohol use: Yes    Alcohol/week: 4.0 standard drinks    Types: 4 Glasses of wine per week   Drug use: No   Sexual activity: Never  Other Topics  Concern   Not on file  Social History Narrative   Not on file   Social Determinants of Health   Financial Resource Strain: Not on file  Food Insecurity: Not on file  Transportation Needs: Not on file  Physical Activity: Not on file  Stress: Not on file  Social Connections: Not on file  Intimate Partner Violence: Not on file     OBSERVATIONS/OBJECTIVE:  BP 135/68 (BP Location: Left Arm, Patient Position: Sitting)   Pulse 72   Temp 97.7 F (36.5 C) (Temporal)   Resp 16   Ht '5\' 6"'  (1.676 m)   Wt (!) 303 lb 6.4 oz (137.6 kg)   LMP 07/20/2008   SpO2 98%   BMI 48.97 kg/m  GENERAL: Patient is a well appearing female in no acute distress HEENT:  Sclerae anicteric.  Oropharynx clear and moist. No ulcerations or evidence of oropharyngeal candidiasis. Neck is supple.  NODES:  No cervical, supraclavicular, or axillary lymphadenopathy palpated.  BREAST EXAM:  left breast status postlumpectomy and radiation no sign of local recurrence right breast is benign. LUNGS:  Clear to auscultation bilaterally.  No wheezes or rhonchi. HEART:  Regular rate and rhythm. No murmur appreciated. ABDOMEN:  Soft, nontender.  Positive, normoactive bowel sounds. No organomegaly palpated. MSK:  No focal spinal tenderness to palpation. Full range of motion bilaterally in the upper extremities. EXTREMITIES:  No peripheral edema.   SKIN:  Clear with no obvious rashes or skin changes. No nail dyscrasia. NEURO:  Nonfocal. Well oriented.  Appropriate affect.  LABORATORY DATA:  None for this visit.  DIAGNOSTIC IMAGING:  None for this visit.      ASSESSMENT AND PLAN:  Tracy [name] is a pleasant 56 y.o. female with Stage IA left breast invasive ductal carcinoma, ER+/PR+/HER2-, diagnosed in 01/2021, treated with lumpectomy, adjuvant radiation therapy, and anti-estrogen therapy with Tamoxifen beginning in March 2023.  She presents to the Survivorship Clinic for our initial meeting and routine follow-up  post-completion of treatment for breast cancer.    1. Stage 1A left breast cancer:  Tracy [name]  is continuing to recover from definitive treatment for breast cancer. She will follow-up with her medical oncologist, Dr. Burr Medico in August 2023 with history and physical exam per surveillance protocol.  She will continue her anti-estrogen therapy with tamoxifen. Thus far, she is tolerating the tamoxifen moderately well, with slowly improving hot flashes and fatigue. Her mammogram is due October 2023. Today, a comprehensive survivorship care plan and treatment summary was reviewed with the patient today detailing her breast cancer diagnosis, treatment course, potential late/long-term effects of treatment, appropriate follow-up care with recommendations for the future, and patient education resources.  A copy of this summary, along with a letter will be sent to the patient's primary care provider via mail/fax/In Basket message after today's visit.    2. Bone health:   She was given education on specific activities to promote bone health.  3. Cancer screening:  Due to Tracy [name]'s history and her age, she should receive screening for skin cancers, colon cancer, and gynecologic cancers.  The information and recommendations are listed on the patient's comprehensive care plan/treatment summary and were reviewed in detail with the patient.    4. Health maintenance and wellness promotion: Tracy [name] was encouraged to consume 5-7 servings of fruits and vegetables per day. We reviewed the "Nutrition Rainbow" handout, as well as the handout .  She was also encouraged to engage in moderate to vigorous exercise for 30 minutes per day most days of the week. We discussed the [employer] fitness program, which is designed for cancer survivors to help them become more physically fit after cancer treatments.  She was instructed to limit her alcohol consumption and continue to abstain from tobacco use.     5. Support  services/counseling: It is not uncommon for this period of the patient's cancer care trajectory to be one of many emotions and stressors. She was given information regarding our available services and encouraged to contact me with any questions or for help enrolling in any of our support group/programs.    Follow up instructions:    -Return to cancer center in August 2023 for follow-up with Dr. Burr Medico. -Mammogram due in October 2023 -Follow up with surgery February 2024 -She is welcome to return back to the Survivorship Clinic at any time; no additional follow-up needed at this time.  -Consider referral back to survivorship as a long-term survivor for continued surveillance  The patient was provided an opportunity to ask questions and all were answered. The patient agreed with the plan and demonstrated an understanding of the instructions.   Total encounter time:45 minutes*in face-to-face visit time, chart review, lab review, care coordination, order entry, and documentation of the encounter time.    Wilber Bihari, NP 07/30/21 11:08 AM Medical Oncology and Hematology [facility] [place], [place] [address] Tel. [phone]    Fax. [phone]  *Total Encounter Time as defined by the Centers for Medicare and Medicaid Services includes, in addition to the face-to-face time of a patient visit (documented in the note above) non-face-to-face time: obtaining and reviewing outside history, ordering and reviewing medications, tests or procedures, care coordination (communications with other health care professionals or caregivers) and documentation in the medical record.

## 2021-07-31 ENCOUNTER — Encounter: Payer: Self-pay | Admitting: Adult Health

## 2021-07-31 LAB — URINE CULTURE: Culture: NO GROWTH

## 2021-08-01 DIAGNOSIS — F332 Major depressive disorder, recurrent severe without psychotic features: Secondary | ICD-10-CM | POA: Diagnosis not present

## 2021-08-01 DIAGNOSIS — G894 Chronic pain syndrome: Secondary | ICD-10-CM | POA: Diagnosis not present

## 2021-08-03 ENCOUNTER — Encounter: Payer: Self-pay | Admitting: Internal Medicine

## 2021-08-03 ENCOUNTER — Encounter: Payer: Self-pay | Admitting: Adult Health

## 2021-08-04 DIAGNOSIS — F332 Major depressive disorder, recurrent severe without psychotic features: Secondary | ICD-10-CM | POA: Diagnosis not present

## 2021-08-08 ENCOUNTER — Telehealth: Payer: Self-pay

## 2021-08-08 NOTE — Telephone Encounter (Signed)
Left message with pt about upcoming appointment and to call back with any questions or concerns.

## 2021-08-12 DIAGNOSIS — F332 Major depressive disorder, recurrent severe without psychotic features: Secondary | ICD-10-CM | POA: Diagnosis not present

## 2021-08-14 ENCOUNTER — Telehealth: Payer: Self-pay | Admitting: *Deleted

## 2021-08-14 ENCOUNTER — Inpatient Hospital Stay: Payer: BC Managed Care – PPO | Attending: Radiation Oncology | Admitting: Adult Health

## 2021-08-14 DIAGNOSIS — C50212 Malignant neoplasm of upper-inner quadrant of left female breast: Secondary | ICD-10-CM

## 2021-08-14 DIAGNOSIS — Z170 Estrogen receptor positive status [ER+]: Secondary | ICD-10-CM | POA: Diagnosis not present

## 2021-08-14 NOTE — Telephone Encounter (Signed)
Pt requesting our office reach out to [facility] regarding referral that was placed in February.  RN contacted scheduling with [facility] who stated pt is still on the wait list and may not be seen until the end of July.  Scheduling stated they would send a request to the new pt referral coordinator to see if pt can be seen any sooner.  RN attempt x1 to contact pt, no answer.  LVM for pt and included the number for [facility] for pt to reach on her end.

## 2021-08-21 ENCOUNTER — Encounter: Payer: Self-pay | Admitting: Adult Health

## 2021-08-21 ENCOUNTER — Encounter: Payer: Self-pay | Admitting: Internal Medicine

## 2021-08-21 NOTE — Progress Notes (Signed)
[facility] Follow up:    [place], [facility] 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 [facility]) Staging form: Breast, AJCC 8th Edition - Clinical stage from 02/12/2021: Stage IA (cT1b, cN0, cM0, G2, ER+, PR+, HER2-) - Signed by Eppie Gibson, MD on 02/12/2021 Stage prefix: Initial diagnosis Histologic grading system: 3 grade system - Pathologic stage from 02/25/2021: Stage IA (pT1b, pN0, cM0, G2, ER+, PR+, HER2-, Oncotype DX score: 3) - Signed by Truitt Merle, MD on 05/03/2021 Stage prefix: Initial diagnosis Multigene prognostic tests performed: Oncotype DX Recurrence score range: Less than 11 Histologic grading system: 3 grade system Residual tumor (R): R0 - None I connected with Easton Sivertson 08/21/21 at  1:45 PM EDT by telephone and verified that I am speaking with the correct person using two identifiers.  I discussed the limitations, risks, security and privacy concerns of performing an evaluation and management service by telephone and the availability of in person appointments.  I also discussed with the patient that there may be a patient responsible charge related to this service. The patient expressed understanding and agreed to proceed.  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 [facility]) Staging form: Breast, AJCC 8th Edition - Clinical stage from 02/12/2021: Stage IA (cT1b, cN0, cM0, G2, ER+, PR+, HER2-) - Signed by Eppie Gibson, MD on 02/12/2021 Stage prefix: Initial diagnosis Histologic grading system: 3 grade system - Pathologic stage from 02/25/2021: Stage IA (pT1b, pN0, cM0, G2, ER+, PR+, HER2-, Oncotype DX score: 3) - Signed by Truitt Merle, MD on 05/03/2021 Stage prefix: Initial diagnosis Multigene prognostic tests performed: Oncotype  DX Recurrence score range: Less than 11 Histologic grading system: 3 grade system Residual tumor (R): R0 - None     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 Suspicious left breast mass corresponding with the screening mammographic findings. 2. No suspicious left axillary lymphadenopathy.   RECOMMENDATION: Ultrasound-guided biopsy of the left breast.   01/23/2021 Procedure   ULTRASOUND GUIDED LEFT BREAST CORE NEEDLE BIOPSY   IMPRESSION: Ultrasound guided biopsy of a mass in the left breast at 11 o'clock. No apparent complications.   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 Based on the biopsy, the carcinoma appears Nottingham grade 2 of 3 and measures 0.4 cm in greatest linear extent.  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  BREAST, LEFT, LUMPECTOMY:  - Invasive ductal carcinoma, 0.9 cm, grade 2  - Ductal  carcinoma in situ, low to intermediate grade  - Biopsy site change  - See oncology table   B. BREAST, LEFT ADDITIONAL ANTERIOSUPERIOR MARGIN, EXCISION:  - Benign breast parenchyma, negative for carcinoma   C. BREAST, LEFT ADDITIONAL MEDIOPOSTERIOR MARGIN, EXCISION:  - Benign breast parenchyma, negative for carcinoma   D. BREAST, LEFT ADDITIONAL INFERIOLATERAL MARGIN, EXCISION:  - Benign breast parenchyma, negative for carcinoma   E. LYMPH NODE, LEFT AXILLARY, SENTINEL, EXCISION:  - Lymph node, negative for carcinoma (0/1)   F. LYMPH NODE, LEFT AXILLARY, SENTINEL, EXCISION:  - Lymph node, negative for carcinoma (0/1)   G. LYMPH NODE, LEFT AXILLARY, SENTINEL, EXCISION:  - Lymph node, negative for carcinoma (0/1)    02/25/2021 Oncotype testing   Recurrence score of 4, predicts a risk of recurrence outside the breast over the next 9 years of 3%    02/25/2021 Cancer Staging   Staging form: Breast, AJCC 8th Edition - Pathologic stage from 02/25/2021: Stage IA (pT1b, pN0, cM0, G2, ER+, PR+, HER2-, Oncotype DX score: 3) - Signed by Truitt Merle, MD on 05/03/2021 Stage prefix: Initial diagnosis Multigene prognostic tests performed: Oncotype DX Recurrence score range: Less than 11 Histologic grading system: 3 grade system Residual tumor (R): R0 - None   04/03/2021 - 05/05/2021 Radiation Therapy   Site Technique Total Dose (Gy) Dose per Fx (Gy) Completed Fx Beam Energies  Breast, Left: Breast_L 3D 40.05/40.05 2.67 15/15 10X, 15X  Breast, Left: Breast_L_Bst 3D 10/10 2 5/5 6X, 10X     05/2021 -  Anti-estrogen oral therapy   Tamoxifen daily     CURRENT THERAPY: Tamoxifen  INTERVAL HISTORY: Tracy [name] 56 y.o. female returns for virtual visit to discuss her lab work.  She noticed that she had an elevated CO2 level and was concerned about this as it resulted on her labs.  She notes that she does snore at night and when she wakes up she does not feel completely rested.  She has a history  of asthma requiring albuterol rescue inhaler.   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  MEDICAL HISTORY: Past Medical History:  Diagnosis Date   Anemia    Anxiety    Anxiety and depression    Arthritis    left knee   Asthma    hardly uses inhaler   Depression    Diverticulosis    Fatty liver 2012   pt states she was told this once   Headache    Migraines   Heart murmur    pt was told this, no echo   Hypertension    history no longer requires medication management   IDA (iron deficiency anemia)    Melasma    Perforated ulcer (HCC)    PUD (peptic ulcer disease)    Sleep apnea    mild, no CPAP ordered    SURGICAL HISTORY: Past Surgical History:  Procedure Laterality Date   ANKLE RECONSTRUCTION Left 09/09/2018  Procedure: BROSTRUM-GOULD LEFT;  Surgeon: Samara Deist, DPM;  Location: [facility];  Service: Podiatry;  Laterality: Left;   BREAST LUMPECTOMY WITH RADIOACTIVE SEED AND SENTINEL LYMPH NODE BIOPSY Left 02/25/2021   Procedure: LEFT BREAST LUMPECTOMY WITH RADIOACTIVE SEED AND SENTINEL LYMPH NODE BIOPSY;  Surgeon: Erroll Luna, MD;  Location: [place];  Service: General;  Laterality: Left;   CHOLECYSTECTOMY     COLONOSCOPY     CYSTOSCOPY N/A 08/05/2016   Procedure: CYSTOSCOPY;  Surgeon: Bobbye Charleston, MD;  Location: [facility];  Service: Gynecology;  Laterality: N/A;   ENDOMETRIAL ABLATION W/ NOVASURE     GASTRIC BYPASS OPEN     LYSIS OF ADHESION N/A 08/05/2016   Procedure:  LYSIS OF ADHESION;  Surgeon: Bobbye Charleston, MD;  Location: [facility];  Service: Gynecology;  Laterality: N/A;   perforated ulcer surgery     ROBOTIC ASSISTED SALPINGO OOPHERECTOMY Right 05/12/2017   Procedure: XI ROBOTIC ASSISTED RIGHT OOPHORECTOMY, Fulgeration of Endometriosis, Peritoneal Biopsy;  Surgeon: Bobbye Charleston, MD;  Location: [facility];  Service: Gynecology;  Laterality: Right;   ROBOTIC ASSISTED TOTAL HYSTERECTOMY WITH SALPINGECTOMY Bilateral 08/05/2016   Procedure: ROBOTIC ASSISTED TOTAL HYSTERECTOMY WITH SALPINGECTOMY;  Surgeon: Bobbye Charleston, MD;  Location: [facility];  Service: Gynecology;  Laterality: Bilateral;   TENDON REPAIR Left 09/09/2018   Procedure: TENOLYSIS, MULTIPLE;  Surgeon: Samara Deist, DPM;  Location: [facility];  Service: Podiatry;  Laterality: Left;   TONSILLECTOMY     UPPER GI ENDOSCOPY      SOCIAL HISTORY: Social History   Socioeconomic History   Marital status: Married    Spouse name: Not on file   Number of children: Not on file   Years of education: Not on file   Highest education level: Not on file  Occupational History   Not on file  Tobacco Use   Smoking status: Never   Smokeless tobacco: Never  Vaping Use   Vaping Use: Never used  Substance and Sexual Activity   Alcohol use: Yes    Alcohol/week: 4.0 standard drinks of alcohol    Types: 4 Glasses of wine per week   Drug use: No   Sexual activity: Never  Other Topics Concern   Not on file  Social History Narrative   Not on file   Social Determinants of Health   Financial Resource Strain: Not on file  Food Insecurity: Not on file  Transportation Needs: Not on file  Physical Activity: Not on file  Stress: Not on file  Social Connections: Not on file  Intimate Partner Violence: Not on file    FAMILY HISTORY: Family History  Problem Relation Age of Onset   Pancreatic cancer Sister 76       died   Breast cancer Maternal Aunt 34   COPD Mother    Anxiety disorder Mother     Depression Mother    Diabetes type II Mother    Obesity Mother    Crohn's disease Mother    Coronary artery disease Father    Diabetes type II Father    Hypertension Father    Obesity Sister    Depression Maternal Grandmother    Obesity Maternal Grandmother    Cancer Maternal Grandmother    Coronary artery disease Maternal Grandmother    Coronary artery disease Paternal Grandfather    Leukemia Paternal Grandfather    Glaucoma Paternal Grandmother    Hypertension Paternal Grandmother    Osteoarthritis Paternal Grandmother    Skin cancer Paternal Grandmother     Review  of Systems  Constitutional:  Positive for fatigue. Negative for appetite change, chills, fever and unexpected weight change.  HENT:   Negative for hearing loss, lump/mass and trouble swallowing.   Eyes:  Negative for eye problems and icterus.  Respiratory:  Negative for chest tightness, cough and shortness of breath.   Cardiovascular:  Negative for chest pain, leg swelling and palpitations.  Gastrointestinal:  Negative for abdominal distention, abdominal pain, constipation, diarrhea, nausea and vomiting.  Endocrine: Negative for hot flashes.  Genitourinary:  Negative for difficulty urinating.   Musculoskeletal:  Negative for arthralgias.  Skin:  Negative for itching and rash.  Neurological:  Negative for dizziness, extremity weakness, headaches and numbness.  Hematological:  Negative for adenopathy. Does not bruise/bleed easily.  Psychiatric/Behavioral:  Negative for depression. The patient is not nervous/anxious.       PHYSICAL EXAMINATION  ECOG PERFORMANCE STATUS: 1 - Symptomatic but completely ambulatory  Patient sounds well she is in no apparent distress.  Mood and behavior are normal.  Speech is normal.  Breathing is nonlabored.  LABORATORY DATA:  CBC    Component Value Date/Time   WBC 5.3 07/30/2021 1150   RBC 4.91 07/30/2021 1150   HGB 11.2 (L) 07/30/2021 1150   HGB 10.9 (L) 12/05/2013 0938    HCT 36.8 07/30/2021 1150   HCT 33.1 (L) 09/26/2013 1414   PLT 285 07/30/2021 1150   PLT 338 12/05/2013 0938   MCV 74.9 (L) 07/30/2021 1150   MCV 72 (L) 09/26/2013 1414   MCH 22.8 (L) 07/30/2021 1150   MCHC 30.4 07/30/2021 1150   RDW 19.5 (H) 07/30/2021 1150   RDW 17.5 (H) 09/26/2013 1414   LYMPHSABS 1.8 07/30/2021 1150   LYMPHSABS 2.8 09/26/2013 1414   MONOABS 0.3 07/30/2021 1150   MONOABS 0.4 09/26/2013 1414   EOSABS 0.2 07/30/2021 1150   EOSABS 0.3 09/26/2013 1414   BASOSABS 0.1 07/30/2021 1150   BASOSABS 0.1 09/26/2013 1414    CMP     Component Value Date/Time   NA 142 07/30/2021 1150   NA 141 09/26/2013 1414   K 3.6 07/30/2021 1150   K 3.2 (L) 09/26/2013 1414   CL 104 07/30/2021 1150   CL 102 09/26/2013 1414   CO2 35 (H) 07/30/2021 1150   CO2 31 09/26/2013 1414   GLUCOSE 96 07/30/2021 1150   GLUCOSE 85 09/26/2013 1414   BUN 16 07/30/2021 1150   BUN 10 09/26/2013 1414   CREATININE 0.63 07/30/2021 1150   CREATININE 0.71 09/26/2013 1414   CALCIUM 9.1 07/30/2021 1150   CALCIUM 8.9 09/26/2013 1414   PROT 6.9 07/30/2021 1150   PROT 7.5 09/26/2013 1414   ALBUMIN 3.7 07/30/2021 1150   ALBUMIN 3.5 09/26/2013 1414   AST 14 (L) 07/30/2021 1150   AST 17 09/26/2013 1414   ALT 24 07/30/2021 1150   ALT 27 09/26/2013 1414   ALKPHOS 96 07/30/2021 1150   ALKPHOS 72 09/26/2013 1414   BILITOT 0.2 (L) 07/30/2021 1150   BILITOT < 0.1 (L) 09/26/2013 1414   GFRNONAA >60 07/30/2021 1150   GFRNONAA >60 09/26/2013 1414   GFRAA >60 05/06/2017 1117   GFRAA >60 09/26/2013 1414        ASSESSMENT and THERAPY PLAN:   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  She is being seen today for follow-up  of her lab results.  She is very concerned about her CO2 levels  in her blood.  I reviewed with her that this can occur with her current weight of 303 pounds.  But we also discussed the possibility of her having sleep apnea since she is at an increased weight and does have some daytime fatigue and snoring at night.  After our thorough discussion I reassured her about her lab work and I placed a referral for her to see pulmonology for a sleep consult.    Tracy [name] hemoglobin is improving with every lab visit.  We will continue to monitor it and see what it is at her next visit before adding additional testing.  Additionally we will reach out to healthy weight and wellness to see where they are at with getting patients in if she is motivated to participate in the program to optimize her weight loss.  Tracy [name] will return in August 2023 for follow-up with Dr. Burr Medico.  All questions were answered. The patient knows to call the clinic with any problems, questions or concerns. We can certainly see the patient much sooner if necessary.  Follow up instructions:    -Return to cancer center August 2023 -Referral to pulmonology for a sleep consult -We will follow-up with healthy weight and wellness regarding her referral status.  The patient was provided an opportunity to ask questions and all were answered. The patient agreed with the plan and demonstrated an understanding of the instructions.   The patient was advised to call back or seek an in-person evaluation if the symptoms worsen or if the condition fails to improve as anticipated.   I provided 15 minutes of non face-to-face telephone visit time during this encounter, and > 50% was spent counseling as documented under my assessment & plan.  Wilber Bihari, NP 08/21/21 11:29 AM Medical Oncology and Hematology [facility] [place],  [address] Tel. [phone]    Fax. [phone]

## 2021-08-21 NOTE — Assessment & Plan Note (Signed)
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  She is being seen today for follow-up of her lab results.  She is very concerned about her CO2 levels in her blood.  I reviewed with her that this can occur with her current weight of 303 pounds.  But we also discussed the possibility of her having sleep apnea since she is at an increased weight and does have some daytime fatigue and snoring at night.  After our thorough discussion I reassured her about her lab work and I placed a referral for her to see pulmonology for a sleep consult.    Tracy [name]'s hemoglobin is improving with every lab visit.  We will continue to monitor it and see what it is at her next visit before adding additional testing.  Additionally we will reach out to healthy weight and wellness to see where they are at with getting patients in if she is motivated to participate in the program to optimize her weight loss.  Tracy [name] will return in August 2023 for follow-up with Dr. Burr Medico.

## 2021-08-25 DIAGNOSIS — F332 Major depressive disorder, recurrent severe without psychotic features: Secondary | ICD-10-CM | POA: Diagnosis not present

## 2021-09-01 ENCOUNTER — Telehealth: Payer: Self-pay | Admitting: Hematology

## 2021-09-01 DIAGNOSIS — F332 Major depressive disorder, recurrent severe without psychotic features: Secondary | ICD-10-CM | POA: Diagnosis not present

## 2021-09-01 NOTE — Telephone Encounter (Signed)
Rescheduled upcoming appointment due to provider's schedule. Patient is aware of changes. 

## 2021-09-04 ENCOUNTER — Ambulatory Visit (INDEPENDENT_AMBULATORY_CARE_PROVIDER_SITE_OTHER): Payer: BC Managed Care – PPO | Admitting: Pulmonary Disease

## 2021-09-04 ENCOUNTER — Encounter: Payer: Self-pay | Admitting: Pulmonary Disease

## 2021-09-04 VITALS — BP 118/72 | HR 68 | Temp 98.0°F | Ht 66.0 in | Wt 305.0 lb

## 2021-09-04 DIAGNOSIS — R0683 Snoring: Secondary | ICD-10-CM | POA: Diagnosis not present

## 2021-09-04 NOTE — Patient Instructions (Addendum)
Schedule for home sleep study  Continue weight loss efforts  Regular exercises as tolerated  Tentative follow-up in 3 to 4 months  Call with significant concerns  Sleep Apnea Sleep apnea affects breathing during sleep. It causes breathing to stop for 10 seconds or more, or to become shallow. People with sleep apnea usually snore loudly. It can also increase the risk of: Heart attack. Stroke. Being very overweight (obese). Diabetes. Heart failure. Irregular heartbeat. High blood pressure. The goal of treatment is to help you breathe normally again. What are the causes?  The most common cause of this condition is a collapsed or blocked airway. There are three kinds of sleep apnea: Obstructive sleep apnea. This is caused by a blocked or collapsed airway. Central sleep apnea. This happens when the brain does not send the right signals to the muscles that control breathing. Mixed sleep apnea. This is a combination of obstructive and central sleep apnea. What increases the risk? Being overweight. Smoking. Having a small airway. Being older. Being female. Drinking alcohol. Taking medicines to calm yourself (sedatives or tranquilizers). Having family members with the condition. Having a tongue or tonsils that are larger than normal. What are the signs or symptoms? Trouble staying asleep. Loud snoring. Headaches in the morning. Waking up gasping. Dry mouth or sore throat in the morning. Being sleepy or tired during the day. If you are sleepy or tired during the day, you may also: Not be able to focus your mind (concentrate). Forget things. Get angry a lot and have mood swings. Feel sad (depressed). Have changes in your personality. Have less interest in sex, if you are female. Be unable to have an erection, if you are female. How is this treated?  Sleeping on your side. Using a medicine to get rid of mucus in your nose (decongestant). Avoiding the use of alcohol, medicines  to help you relax, or certain pain medicines (narcotics). Losing weight, if needed. Changing your diet. Quitting smoking. Using a machine to open your airway while you sleep, such as: An oral appliance. This is a mouthpiece that shifts your lower jaw forward. A CPAP device. This device blows air through a mask when you breathe out (exhale). An EPAP device. This has valves that you put in each nostril. A BIPAP device. This device blows air through a mask when you breathe in (inhale) and breathe out. Having surgery if other treatments do not work. Follow these instructions at home: Lifestyle Make changes that your doctor recommends. Eat a healthy diet. Lose weight if needed. Avoid alcohol, medicines to help you relax, and some pain medicines. Do not smoke or use any products that contain nicotine or tobacco. If you need help quitting, ask your doctor. General instructions Take over-the-counter and prescription medicines only as told by your doctor. If you were given a machine to use while you sleep, use it only as told by your doctor. If you are having surgery, make sure to tell your doctor you have sleep apnea. You may need to bring your device with you. Keep all follow-up visits. Contact a doctor if: The machine that you were given to use during sleep bothers you or does not seem to be working. You do not get better. You get worse. Get help right away if: Your chest hurts. You have trouble breathing in enough air. You have an uncomfortable feeling in your back, arms, or stomach. You have trouble talking. One side of your body feels weak. A part of your face  is hanging down. These symptoms may be an emergency. Get help right away. Call your local emergency services (911 in the [place] Do not wait to see if the symptoms will go away. Do not drive yourself to the hospital. Summary This condition affects breathing during sleep. The most common cause is a collapsed or blocked  airway. The goal of treatment is to help you breathe normally while you sleep. This information is not intended to replace advice given to you by your health care provider. Make sure you discuss any questions you have with your health care provider. Document Revised: 10/09/2020 Document Reviewed: 02/09/2020 Elsevier Patient Education  [place].

## 2021-09-05 DIAGNOSIS — F332 Major depressive disorder, recurrent severe without psychotic features: Secondary | ICD-10-CM | POA: Diagnosis not present

## 2021-09-05 DIAGNOSIS — G894 Chronic pain syndrome: Secondary | ICD-10-CM | POA: Diagnosis not present

## 2021-09-15 ENCOUNTER — Ambulatory Visit: Payer: BC Managed Care – PPO | Attending: Surgery

## 2021-09-15 DIAGNOSIS — Z483 Aftercare following surgery for neoplasm: Secondary | ICD-10-CM | POA: Insufficient documentation

## 2021-09-17 DIAGNOSIS — F332 Major depressive disorder, recurrent severe without psychotic features: Secondary | ICD-10-CM | POA: Diagnosis not present

## 2021-09-22 DIAGNOSIS — F332 Major depressive disorder, recurrent severe without psychotic features: Secondary | ICD-10-CM | POA: Diagnosis not present

## 2021-09-29 DIAGNOSIS — F332 Major depressive disorder, recurrent severe without psychotic features: Secondary | ICD-10-CM | POA: Diagnosis not present

## 2021-10-06 DIAGNOSIS — F332 Major depressive disorder, recurrent severe without psychotic features: Secondary | ICD-10-CM | POA: Diagnosis not present

## 2021-10-09 DIAGNOSIS — E538 Deficiency of other specified B group vitamins: Secondary | ICD-10-CM | POA: Diagnosis not present

## 2021-10-13 DIAGNOSIS — F332 Major depressive disorder, recurrent severe without psychotic features: Secondary | ICD-10-CM | POA: Diagnosis not present

## 2021-10-17 DIAGNOSIS — Z79891 Long term (current) use of opiate analgesic: Secondary | ICD-10-CM | POA: Diagnosis not present

## 2021-10-17 DIAGNOSIS — F332 Major depressive disorder, recurrent severe without psychotic features: Secondary | ICD-10-CM | POA: Diagnosis not present

## 2021-10-17 DIAGNOSIS — Z5181 Encounter for therapeutic drug level monitoring: Secondary | ICD-10-CM | POA: Diagnosis not present

## 2021-10-20 DIAGNOSIS — F332 Major depressive disorder, recurrent severe without psychotic features: Secondary | ICD-10-CM | POA: Diagnosis not present

## 2021-10-22 ENCOUNTER — Encounter: Payer: Self-pay | Admitting: Pulmonary Disease

## 2021-10-27 DIAGNOSIS — L03012 Cellulitis of left finger: Secondary | ICD-10-CM | POA: Diagnosis not present

## 2021-10-27 DIAGNOSIS — F332 Major depressive disorder, recurrent severe without psychotic features: Secondary | ICD-10-CM | POA: Diagnosis not present

## 2021-10-29 ENCOUNTER — Ambulatory Visit: Payer: BC Managed Care – PPO

## 2021-10-29 DIAGNOSIS — R0683 Snoring: Secondary | ICD-10-CM

## 2021-10-29 DIAGNOSIS — G4733 Obstructive sleep apnea (adult) (pediatric): Secondary | ICD-10-CM | POA: Diagnosis not present

## 2021-10-31 ENCOUNTER — Other Ambulatory Visit: Payer: BC Managed Care – PPO

## 2021-10-31 ENCOUNTER — Ambulatory Visit: Payer: BC Managed Care – PPO | Admitting: Hematology

## 2021-10-31 ENCOUNTER — Inpatient Hospital Stay (HOSPITAL_BASED_OUTPATIENT_CLINIC_OR_DEPARTMENT_OTHER): Payer: BC Managed Care – PPO | Admitting: Hematology

## 2021-10-31 ENCOUNTER — Inpatient Hospital Stay: Payer: BC Managed Care – PPO | Attending: Radiation Oncology

## 2021-10-31 VITALS — BP 153/82 | HR 81 | Temp 98.3°F | Resp 18 | Wt 305.3 lb

## 2021-10-31 DIAGNOSIS — Z170 Estrogen receptor positive status [ER+]: Secondary | ICD-10-CM

## 2021-10-31 DIAGNOSIS — Z90710 Acquired absence of both cervix and uterus: Secondary | ICD-10-CM | POA: Insufficient documentation

## 2021-10-31 DIAGNOSIS — C50212 Malignant neoplasm of upper-inner quadrant of left female breast: Secondary | ICD-10-CM

## 2021-10-31 DIAGNOSIS — Z79899 Other long term (current) drug therapy: Secondary | ICD-10-CM | POA: Diagnosis not present

## 2021-10-31 DIAGNOSIS — D649 Anemia, unspecified: Secondary | ICD-10-CM

## 2021-10-31 DIAGNOSIS — Z800 Family history of malignant neoplasm of digestive organs: Secondary | ICD-10-CM | POA: Insufficient documentation

## 2021-10-31 DIAGNOSIS — E538 Deficiency of other specified B group vitamins: Secondary | ICD-10-CM | POA: Diagnosis not present

## 2021-10-31 DIAGNOSIS — Z79810 Long term (current) use of selective estrogen receptor modulators (SERMs): Secondary | ICD-10-CM | POA: Insufficient documentation

## 2021-10-31 DIAGNOSIS — Z9884 Bariatric surgery status: Secondary | ICD-10-CM | POA: Insufficient documentation

## 2021-10-31 DIAGNOSIS — Z9079 Acquired absence of other genital organ(s): Secondary | ICD-10-CM | POA: Diagnosis not present

## 2021-10-31 LAB — CBC WITH DIFFERENTIAL/PLATELET
Abs Immature Granulocytes: 0.01 10*3/uL (ref 0.00–0.07)
Basophils Absolute: 0.1 10*3/uL (ref 0.0–0.1)
Basophils Relative: 1 %
Eosinophils Absolute: 0.1 10*3/uL (ref 0.0–0.5)
Eosinophils Relative: 3 %
HCT: 34.4 % — ABNORMAL LOW (ref 36.0–46.0)
Hemoglobin: 10.9 g/dL — ABNORMAL LOW (ref 12.0–15.0)
Immature Granulocytes: 0 %
Lymphocytes Relative: 41 %
Lymphs Abs: 2.1 10*3/uL (ref 0.7–4.0)
MCH: 23.6 pg — ABNORMAL LOW (ref 26.0–34.0)
MCHC: 31.7 g/dL (ref 30.0–36.0)
MCV: 74.6 fL — ABNORMAL LOW (ref 80.0–100.0)
Monocytes Absolute: 0.4 10*3/uL (ref 0.1–1.0)
Monocytes Relative: 7 %
Neutro Abs: 2.5 10*3/uL (ref 1.7–7.7)
Neutrophils Relative %: 48 %
Platelets: 273 10*3/uL (ref 150–400)
RBC: 4.61 MIL/uL (ref 3.87–5.11)
RDW: 16.5 % — ABNORMAL HIGH (ref 11.5–15.5)
WBC: 5.2 10*3/uL (ref 4.0–10.5)
nRBC: 0 % (ref 0.0–0.2)

## 2021-10-31 LAB — CMP (CANCER CENTER ONLY)
ALT: 41 U/L (ref 0–44)
AST: 58 U/L — ABNORMAL HIGH (ref 15–41)
Albumin: 3.6 g/dL (ref 3.5–5.0)
Alkaline Phosphatase: 113 U/L (ref 38–126)
Anion gap: 6 (ref 5–15)
BUN: 22 mg/dL — ABNORMAL HIGH (ref 6–20)
CO2: 29 mmol/L (ref 22–32)
Calcium: 8.8 mg/dL — ABNORMAL LOW (ref 8.9–10.3)
Chloride: 104 mmol/L (ref 98–111)
Creatinine: 0.62 mg/dL (ref 0.44–1.00)
GFR, Estimated: >60 mL/min (ref >60)
Glucose, Bld: 101 mg/dL — ABNORMAL HIGH (ref 70–99)
Potassium: 3.6 mmol/L (ref 3.5–5.1)
Sodium: 139 mmol/L (ref 135–145)
Total Bilirubin: 0.3 mg/dL (ref 0.3–1.2)
Total Protein: 7.2 g/dL (ref 6.5–8.1)

## 2021-10-31 MED ORDER — GABAPENTIN 100 MG PO CAPS: 100.0000 mg | ORAL_CAPSULE | Freq: Every day | ORAL | 1 refills | Status: AC

## 2021-10-31 NOTE — Progress Notes (Signed)
[place]   [phone] [phone]   Clinic Follow up Note   Patient Care Team: [facility] as PCP - General Truitt Merle, MD as Consulting Physician (Hematology) Eppie Gibson, MD as Attending Physician (Radiation Oncology) Erroll Luna, MD as Consulting Physician (General Surgery)  Date of Service:  11/01/2021  CHIEF COMPLAINT: f/u of left breast cancer  CURRENT THERAPY:  Tamoxifen, starting 05/2021  ASSESSMENT & PLAN:  Tracy [name] is a 56 y.o. post-hysterectomy female with   1. Malignant neoplasm of upper-inner quadrant of left breast, invasive ductal carcinoma, pT1b, N0, M0, Stage 1A, ER/PR: Positive, HER2: negative, Grade 2.  Oncotype RS 4 -Found on screening mammogram. S/p left lumpectomy 02/25/21 with Dr. Brantley Stage, pathology showed 0.9 cm IDC, benign margins and lymph nodes (0/3). -Oncotype RS of 4, indicating low risk.  -s/p adjuvant radiation under Dr. Isidore Moos, 04/03/21 - 05/05/21 -she is s/p hysterectomy 07/2016, ovaries are still in place. Labs from 05/02/21 show she is still pre-menopausal, so she began tamoxifen in 05/2021. She is tolerating well with hot flashes -she is clinically doing well. Labs reviewed, she has stable anemia. Physical exam was unremarkable. There is no clinical concern for recurrence. -continue surveillance, next mammogram due late 12/2021.  2. Hot flashes -secondary to tamoxifen -she previously took gabapentin for sleep. We will restart this at night for the hot flashes.  3. Obesity  -her weight has been stable since at least 09/2017. -she is scheduled for appointment with bariatrics at [facility] on 11/21/21  4. Anemia, iron and B12 deficiency  -h/o gastric bypass, which is likely the cause of her anemia  -she previously received IV iron under care of Dr. Rogue Bussing in 2016-2017, continues B12 injections with PCP. -she endorses taking oral iron but does not feel it is helpful. -we will check her  ferritin and iron levels to see if she needs additional IV iron   5. Family History of Cancer/Genetics -Patient's sister diagnosed with pancreatic cancer at age 27. Due to this, the patient underwent genetic testing to determine her hereditary risk for cancer. -The patient is unsure of the name of the test she had performed but stated it was negative for hereditary risk of cancer.     PLAN: -continue tamoxifen -I called in gabapentin for her hot flushes  -lab next week to see if she needs iv iron, if yes, will repeat lab in 3 months  -lab and f/u with NP Mendel Ryder in 6 months   No problem-specific Assessment & Plan notes found for this encounter.   SUMMARY OF ONCOLOGIC HISTORY: Oncology History Overview Note   Cancer Staging  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 ([place]) Staging form: Breast, AJCC 8th Edition - Clinical stage from 02/12/2021: Stage IA (cT1b, cN0, cM0, G2, ER+, PR+, HER2-) - Signed by Eppie Gibson, MD on 02/12/2021 Stage prefix: Initial diagnosis Histologic grading system: 3 grade system - Pathologic stage from 02/25/2021: Stage IA (pT1b, pN0, cM0, G2, ER+, PR+, HER2-, Oncotype DX score: 3) - Signed by Truitt Merle, MD on 05/03/2021 Stage prefix: Initial diagnosis Multigene prognostic tests performed: Oncotype DX Recurrence score range: Less than 11 Histologic grading system: 3 grade system Residual tumor (R): R0 - None     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 ([place])  01/15/2021 Imaging   MM DIAG BREAST TOMO UNI LEFT   IMPRESSION: 1. Suspicious left breast mass corresponding with the screening mammographic findings. 2. No suspicious left axillary lymphadenopathy.   RECOMMENDATION:  Ultrasound-guided biopsy of the left breast.   01/23/2021 Procedure   ULTRASOUND GUIDED LEFT BREAST CORE NEEDLE BIOPSY   IMPRESSION: Ultrasound guided biopsy of a mass in the left breast at 11 o'clock. No apparent complications.   01/23/2021 Pathology Results    Diagnosis Breast, left, needle core biopsy, left breast 11:00, 6cmfn, ribbon clip - INVASIVE MAMMARY CARCINOMA - LOBULAR NEOPLASIA (ATYPICAL LOBULAR HYPERPLASIA) - SEE COMMENT Microscopic Comment the biopsy material shows an infiltrative proliferation of cells arranged linearly and in small clusters. Based on the biopsy, the carcinoma appears Nottingham grade 2 of 3 and measures 0.4 cm in greatest linear extent.  Estrogen Receptor: 90%, POSITIVE, STRONG STAINING INTENSITY Progesterone Receptor: 95%, POSITIVE, STRONG STAINING INTENSITY Proliferation Marker Ki67: 1% REFERENCE RANGE ESTROGEN RECEPTOR NEGATIVE 0% POSITIVE =>1% REFERENCE RANGE PROGESTERONE RECEPTOR NEGATIVE 0% POSITIVE =>1%  Results: GROUP 5: HER2 **NEGATIVE**   02/12/2021 Initial Diagnosis   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 ([place])   02/12/2021 Cancer Staging   Staging form: Breast, AJCC 8th Edition - Clinical stage from 02/12/2021: Stage IA (cT1b, cN0, cM0, G2, ER+, PR+, HER2-) - Signed by Eppie Gibson, MD on 02/12/2021 Stage prefix: Initial diagnosis Histologic grading system: 3 grade system   02/25/2021 Definitive Surgery   FINAL MICROSCOPIC DIAGNOSIS:   A. 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 BREAST, LEFT ADDITIONAL ANTERIOSUPERIOR MARGIN, EXCISION:  - Benign breast parenchyma, negative for carcinoma   C. BREAST, LEFT ADDITIONAL MEDIOPOSTERIOR MARGIN, EXCISION:  - Benign breast parenchyma, negative for carcinoma   D. BREAST, LEFT ADDITIONAL INFERIOLATERAL MARGIN, EXCISION:  - Benign breast parenchyma, negative for carcinoma   E. LYMPH NODE, LEFT AXILLARY, SENTINEL, EXCISION:  - Lymph node, negative for carcinoma (0/1)   F. LYMPH NODE, LEFT AXILLARY, SENTINEL, EXCISION:  - Lymph node, negative for carcinoma (0/1)   G. LYMPH NODE, LEFT AXILLARY,  SENTINEL, EXCISION:  - Lymph node, negative for carcinoma (0/1)    02/25/2021 Oncotype testing   Recurrence score of 4, predicts a risk of recurrence outside the breast over the next 9 years of 3%    02/25/2021 Cancer Staging   Staging form: Breast, AJCC 8th Edition - Pathologic stage from 02/25/2021: Stage IA (pT1b, pN0, cM0, G2, ER+, PR+, HER2-, Oncotype DX score: 3) - Signed by Truitt Merle, MD on 05/03/2021 Stage prefix: Initial diagnosis Multigene prognostic tests performed: Oncotype DX Recurrence score range: Less than 11 Histologic grading system: 3 grade system Residual tumor (R): R0 - None   04/03/2021 - 05/05/2021 Radiation Therapy   Site Technique Total Dose (Gy) Dose per Fx (Gy) Completed Fx Beam Energies  Breast, Left: Breast_L 3D 40.05/40.05 2.67 15/15 10X, 15X  Breast, Left: Breast_L_Bst 3D 10/10 2 5/5 6X, 10X     05/2021 -  Anti-estrogen oral therapy   Tamoxifen daily      INTERVAL HISTORY:  Tracy [name] is here for a follow up of breast cancer. She was last seen by NP Mendel Ryder on 08/21/21. She presents to the clinic alone. She reports she is doing well overall. She denies any breast concerns. She reports bothersome hot flashes from tamoxifen. She also notes difficulty sleeping, which was an issue prior to starting tamoxifen.   All other systems were reviewed with the patient and are negative.  MEDICAL HISTORY:  Past Medical History:  Diagnosis Date  Anemia    Anxiety    Anxiety and depression    Arthritis    left knee   Asthma    hardly uses inhaler   Depression    Diverticulosis    Fatty liver 2012   pt states she was told this once   Headache    Migraines   Heart murmur    pt was told this, no echo   Hypertension    history no longer requires medication management   IDA (iron deficiency anemia)    Melasma    Perforated ulcer (HCC)    PUD (peptic ulcer disease)    Sleep apnea    mild, no CPAP ordered    SURGICAL HISTORY: Past Surgical History:   Procedure Laterality Date   ANKLE RECONSTRUCTION Left 09/09/2018   Procedure: BROSTRUM-GOULD LEFT;  Surgeon: Samara Deist, DPM;  Location: [facility];  Service: Podiatry;  Laterality: Left;   BREAST LUMPECTOMY WITH RADIOACTIVE SEED AND SENTINEL LYMPH NODE BIOPSY Left 02/25/2021   Procedure: LEFT BREAST LUMPECTOMY WITH RADIOACTIVE SEED AND SENTINEL LYMPH NODE BIOPSY;  Surgeon: Erroll Luna, MD;  Location: [place];  Service: General;  Laterality: Left;   CHOLECYSTECTOMY     COLONOSCOPY     CYSTOSCOPY N/A 08/05/2016   Procedure: CYSTOSCOPY;  Surgeon: Bobbye Charleston, MD;  Location: [place] [facility];  Service: Gynecology;  Laterality: N/A;   ENDOMETRIAL ABLATION W/ NOVASURE     GASTRIC BYPASS OPEN     LYSIS OF ADHESION N/A 08/05/2016   Procedure: LYSIS OF ADHESION;  Surgeon: Bobbye Charleston, MD;  Location: [place] [facility];  Service: Gynecology;  Laterality: N/A;   perforated ulcer surgery     ROBOTIC ASSISTED SALPINGO OOPHERECTOMY Right 05/12/2017   Procedure: XI ROBOTIC ASSISTED RIGHT OOPHORECTOMY, Fulgeration of Endometriosis, Peritoneal Biopsy;  Surgeon: Bobbye Charleston, MD;  Location: [facility];  Service: Gynecology;  Laterality: Right;   ROBOTIC ASSISTED TOTAL HYSTERECTOMY WITH SALPINGECTOMY Bilateral 08/05/2016   Procedure: ROBOTIC ASSISTED TOTAL HYSTERECTOMY WITH SALPINGECTOMY;  Surgeon: Bobbye Charleston, MD;  Location: [place] [facility];  Service: Gynecology;  Laterality: Bilateral;   TENDON REPAIR Left 09/09/2018   Procedure: TENOLYSIS, MULTIPLE;  Surgeon: Samara Deist, DPM;  Location: [facility];  Service: Podiatry;  Laterality: Left;   TONSILLECTOMY     UPPER GI ENDOSCOPY      I have reviewed the social history and family history with the patient and they are unchanged from previous note.  ALLERGIES:  is allergic to doxepin, ibuprofen, morphine, nsaids, tramadol, and trazodone.  MEDICATIONS:  Current Outpatient Medications  Medication Sig Dispense Refill   gabapentin (NEURONTIN) 100 MG capsule Take 1  capsule (100 mg total) by mouth at bedtime. OK to increase dose to 3 capsules at night in 2-3 weeks if needed for hot flushes 90 capsule 1   acetaminophen (TYLENOL) 500 MG tablet Take 1,000 mg by mouth every 6 (six) hours as needed for mild pain.     albuterol (PROVENTIL) (2.5 MG/3ML) 0.083% nebulizer solution Take 2.5 mg by nebulization every 6 (six) hours as needed for wheezing or shortness of breath.     albuterol (VENTOLIN HFA) 108 (90 Base) MCG/ACT inhaler Inhale 2 puffs into the lungs every 6 (six) hours as needed for wheezing or shortness of breath.     ALPRAZolam (XANAX) 0.5 MG tablet Take 0.5 tablets by mouth 2 (two) times daily as needed for anxiety.   0   azelastine (ASTELIN) 0.1 % nasal spray Place 1 spray into both nostrils daily as needed for allergies or  rhinitis.  12   cetirizine (ZYRTEC) 10 MG tablet Take 10 mg by mouth daily as needed (allergies).     Cyanocobalamin 1000 MCG/ML KIT Inject 1,000 mcg as directed every 30 (thirty) days.     desvenlafaxine (PRISTIQ) 50 MG 24 hr tablet Take 50 mg by mouth daily.     hydrochlorothiazide (MICROZIDE) 12.5 MG capsule Take 12.5 mg by mouth daily.     lamoTRIgine (LAMICTAL) 100 MG tablet Take 200 mg by mouth 2 (two) times daily.     methylphenidate (RITALIN) 5 MG tablet Take 5 mg by mouth 2 (two) times daily.     ondansetron (ZOFRAN) 4 MG tablet Take 1 tablet (4 mg total) by mouth daily as needed for nausea or vomiting. 30 tablet 1   SPRAVATO, 84 MG DOSE, 28 MG/DEVICE SOPK Place into both nostrils.     tamoxifen (NOLVADEX) 20 MG tablet Take 1 tablet (20 mg total) by mouth daily. 30 tablet 5   valACYclovir (VALTREX) 1000 MG tablet Take 1,000 mg by mouth daily as needed (cold sores).      No current facility-administered medications for this visit.    PHYSICAL EXAMINATION: ECOG PERFORMANCE STATUS: 0 - Asymptomatic  Vitals:   10/31/21 1611  BP: (!) 153/82  Pulse: 81  Resp: 18  Temp: 98.3 F (36.8 C)  SpO2: 96%   Wt Readings from  Last 3 Encounters:  10/31/21 (!) 305 lb 5 oz (138.5 kg)  09/04/21 (!) 305 lb (138.3 kg)  07/30/21 (!) 303 lb 6.4 oz (137.6 kg)     GENERAL:alert, no distress and comfortable SKIN: skin color, texture, turgor are normal, no rashes or significant lesions EYES: normal, Conjunctiva are pink and non-injected, sclera clear  NECK: supple, thyroid normal size, non-tender, without nodularity LYMPH:  no palpable lymphadenopathy in the cervical, axillary LUNGS: clear to auscultation and percussion with normal breathing effort HEART: regular rate & rhythm and no murmurs and no lower extremity edema ABDOMEN:abdomen soft, non-tender and normal bowel sounds Musculoskeletal:no cyanosis of digits and no clubbing  NEURO: alert & oriented x 3 with fluent speech, no focal motor/sensory deficits BREAST: No palpable mass, nodules or adenopathy bilaterally. Breast exam benign.   LABORATORY DATA:  I have reviewed the data as listed    Latest Ref Rng & Units 10/31/2021    2:41 PM 07/30/2021   11:50 AM 05/26/2021    4:57 PM  CBC  WBC 4.0 - 10.5 K/uL 5.2  5.3  4.7   Hemoglobin 12.0 - 15.0 g/dL 10.9  11.2  10.2   Hematocrit 36.0 - 46.0 % 34.4  36.8  34.6   Platelets 150 - 400 K/uL 273  285  234         Latest Ref Rng & Units 10/31/2021    2:41 PM 07/30/2021   11:50 AM 05/26/2021    4:57 PM  CMP  Glucose 70 - 99 mg/dL 101  96  110   BUN 6 - 20 mg/dL '22  16  13   ' Creatinine 0.44 - 1.00 mg/dL 0.62  0.63  0.53   Sodium 135 - 145 mmol/L 139  142  137   Potassium 3.5 - 5.1 mmol/L 3.6  3.6  3.0   Chloride 98 - 111 mmol/L 104  104  95   CO2 22 - 32 mmol/L 29  35  34   Calcium 8.9 - 10.3 mg/dL 8.8  9.1  8.9   Total Protein 6.5 - 8.1 g/dL 7.2  6.9  Total Bilirubin 0.3 - 1.2 mg/dL 0.3  0.2    Alkaline Phos 38 - 126 U/L 113  96    AST 15 - 41 U/L 58  14    ALT 0 - 44 U/L 41  24        RADIOGRAPHIC STUDIES: I have personally reviewed the radiological images as listed and agreed with the findings in the  report. No results found.    Orders Placed This Encounter  Procedures   Ferritin    Standing Status:   Standing    Number of Occurrences:   5    Standing Expiration Date:   11/01/2022   Iron and Iron Binding Capacity ([facility] only)    Standing Status:   Standing    Number of Occurrences:   5    Standing Expiration Date:   11/01/2022   All questions were answered. The patient knows to call the clinic with any problems, questions or concerns. No barriers to learning was detected. The total time spent in the appointment was 30 minutes.     Truitt Merle, MD 11/01/2021   I, Wilburn Mylar, am acting as scribe for Truitt Merle, MD.   I have reviewed the above documentation for accuracy and completeness, and I agree with the above.

## 2021-11-01 ENCOUNTER — Encounter: Payer: Self-pay | Admitting: Hematology

## 2021-11-01 ENCOUNTER — Encounter: Payer: Self-pay | Admitting: Internal Medicine

## 2021-11-03 DIAGNOSIS — F332 Major depressive disorder, recurrent severe without psychotic features: Secondary | ICD-10-CM | POA: Diagnosis not present

## 2021-11-05 ENCOUNTER — Telehealth: Payer: Self-pay | Admitting: Hematology

## 2021-11-05 ENCOUNTER — Telehealth: Payer: Self-pay | Admitting: Pulmonary Disease

## 2021-11-05 DIAGNOSIS — G4733 Obstructive sleep apnea (adult) (pediatric): Secondary | ICD-10-CM | POA: Diagnosis not present

## 2021-11-05 NOTE — Telephone Encounter (Signed)
Call patient  Sleep study result  Date of study: 10/29/2021  Impression: Mild obstructive sleep apnea  Recommendation: Options of treatment for mild obstructive sleep apnea will include  1.  CPAP therapy if there is significant daytime sleepiness or other comorbidities including history of CVA or cardiac disease  -If CPAP is chosen as an option of treatment auto titrating CPAP with a pressure setting of 5-15 will be appropriate  2.  Watchful waiting with emphasis on weight loss measures, sleep position modification to optimize lateral sleep, elevating the head of the bed by about 30 degrees may also help.  3.  An oral device may be fashioned for the treatment of mild sleep disordered breathing, will involve referral to dentist.   Follow-up as previously scheduled

## 2021-11-05 NOTE — Telephone Encounter (Signed)
Scheduled follow-up appointments per 8/18 los. Patient is aware.

## 2021-11-06 DIAGNOSIS — G5731 Lesion of lateral popliteal nerve, right lower limb: Secondary | ICD-10-CM | POA: Diagnosis not present

## 2021-11-06 DIAGNOSIS — M19071 Primary osteoarthritis, right ankle and foot: Secondary | ICD-10-CM | POA: Diagnosis not present

## 2021-11-06 DIAGNOSIS — M79671 Pain in right foot: Secondary | ICD-10-CM | POA: Diagnosis not present

## 2021-11-07 ENCOUNTER — Other Ambulatory Visit: Payer: Self-pay

## 2021-11-07 ENCOUNTER — Inpatient Hospital Stay: Payer: BC Managed Care – PPO

## 2021-11-07 DIAGNOSIS — Z79899 Other long term (current) drug therapy: Secondary | ICD-10-CM | POA: Diagnosis not present

## 2021-11-07 DIAGNOSIS — Z9079 Acquired absence of other genital organ(s): Secondary | ICD-10-CM | POA: Diagnosis not present

## 2021-11-07 DIAGNOSIS — D649 Anemia, unspecified: Secondary | ICD-10-CM | POA: Diagnosis not present

## 2021-11-07 DIAGNOSIS — Z9884 Bariatric surgery status: Secondary | ICD-10-CM | POA: Diagnosis not present

## 2021-11-07 DIAGNOSIS — Z170 Estrogen receptor positive status [ER+]: Secondary | ICD-10-CM | POA: Diagnosis not present

## 2021-11-07 DIAGNOSIS — C50212 Malignant neoplasm of upper-inner quadrant of left female breast: Secondary | ICD-10-CM | POA: Diagnosis not present

## 2021-11-07 DIAGNOSIS — E538 Deficiency of other specified B group vitamins: Secondary | ICD-10-CM | POA: Diagnosis not present

## 2021-11-07 DIAGNOSIS — Z79810 Long term (current) use of selective estrogen receptor modulators (SERMs): Secondary | ICD-10-CM | POA: Diagnosis not present

## 2021-11-07 DIAGNOSIS — Z90710 Acquired absence of both cervix and uterus: Secondary | ICD-10-CM | POA: Diagnosis not present

## 2021-11-07 DIAGNOSIS — Z800 Family history of malignant neoplasm of digestive organs: Secondary | ICD-10-CM | POA: Diagnosis not present

## 2021-11-07 LAB — CBC WITH DIFFERENTIAL/PLATELET
Abs Immature Granulocytes: 0.02 10*3/uL (ref 0.00–0.07)
Basophils Absolute: 0 10*3/uL (ref 0.0–0.1)
Basophils Relative: 1 %
Eosinophils Absolute: 0 10*3/uL (ref 0.0–0.5)
Eosinophils Relative: 0 %
HCT: 34.8 % — ABNORMAL LOW (ref 36.0–46.0)
Hemoglobin: 11 g/dL — ABNORMAL LOW (ref 12.0–15.0)
Immature Granulocytes: 0 %
Lymphocytes Relative: 29 %
Lymphs Abs: 2 10*3/uL (ref 0.7–4.0)
MCH: 23.7 pg — ABNORMAL LOW (ref 26.0–34.0)
MCHC: 31.6 g/dL (ref 30.0–36.0)
MCV: 74.8 fL — ABNORMAL LOW (ref 80.0–100.0)
Monocytes Absolute: 0.4 10*3/uL (ref 0.1–1.0)
Monocytes Relative: 7 %
Neutro Abs: 4.3 10*3/uL (ref 1.7–7.7)
Neutrophils Relative %: 63 %
Platelets: 301 10*3/uL (ref 150–400)
RBC: 4.65 MIL/uL (ref 3.87–5.11)
RDW: 16.5 % — ABNORMAL HIGH (ref 11.5–15.5)
WBC: 6.8 10*3/uL (ref 4.0–10.5)
nRBC: 0 % (ref 0.0–0.2)

## 2021-11-07 LAB — COMPREHENSIVE METABOLIC PANEL
ALT: 21 U/L (ref 0–44)
AST: 43 U/L — ABNORMAL HIGH (ref 15–41)
Albumin: 3.9 g/dL (ref 3.5–5.0)
Alkaline Phosphatase: 81 U/L (ref 38–126)
Anion gap: 5 (ref 5–15)
BUN: 18 mg/dL (ref 6–20)
CO2: 32 mmol/L (ref 22–32)
Calcium: 9.4 mg/dL (ref 8.9–10.3)
Chloride: 105 mmol/L (ref 98–111)
Creatinine, Ser: 0.46 mg/dL (ref 0.44–1.00)
GFR, Estimated: >60 mL/min (ref >60)
Glucose, Bld: 107 mg/dL — ABNORMAL HIGH (ref 70–99)
Potassium: 4 mmol/L (ref 3.5–5.1)
Sodium: 142 mmol/L (ref 135–145)
Total Bilirubin: 0.2 mg/dL — ABNORMAL LOW (ref 0.3–1.2)
Total Protein: 7 g/dL (ref 6.5–8.1)

## 2021-11-07 LAB — IRON AND IRON BINDING CAPACITY (CC-WL,HP ONLY)
Iron: 15 ug/dL — ABNORMAL LOW (ref 28–170)
Saturation Ratios: 3 % — ABNORMAL LOW (ref 10.4–31.8)
TIBC: 573 ug/dL — ABNORMAL HIGH (ref 250–450)
UIBC: 558 ug/dL — ABNORMAL HIGH (ref 148–442)

## 2021-11-07 LAB — FERRITIN: Ferritin: 4 ng/mL — ABNORMAL LOW (ref 11–307)

## 2021-11-08 ENCOUNTER — Other Ambulatory Visit: Payer: Self-pay | Admitting: Hematology

## 2021-11-09 ENCOUNTER — Other Ambulatory Visit: Payer: Self-pay | Admitting: Hematology

## 2021-11-10 ENCOUNTER — Other Ambulatory Visit: Payer: Self-pay

## 2021-11-10 DIAGNOSIS — F332 Major depressive disorder, recurrent severe without psychotic features: Secondary | ICD-10-CM | POA: Diagnosis not present

## 2021-11-11 ENCOUNTER — Telehealth: Payer: Self-pay | Admitting: Hematology

## 2021-11-11 DIAGNOSIS — E538 Deficiency of other specified B group vitamins: Secondary | ICD-10-CM | POA: Diagnosis not present

## 2021-11-11 NOTE — Telephone Encounter (Signed)
Contacted patient to scheduled appointments. Patient is aware of appointments that are scheduled.   

## 2021-11-13 ENCOUNTER — Encounter: Payer: Self-pay | Admitting: Internal Medicine

## 2021-11-13 NOTE — Telephone Encounter (Signed)
Patient is returning a call regarding test results.  Please call patient to discuss at [phone]

## 2021-11-13 NOTE — Telephone Encounter (Signed)
I called the patient and let her know to call back for results.

## 2021-11-14 ENCOUNTER — Encounter: Payer: Self-pay | Admitting: Internal Medicine

## 2021-11-14 ENCOUNTER — Encounter (INDEPENDENT_AMBULATORY_CARE_PROVIDER_SITE_OTHER): Payer: Self-pay

## 2021-11-14 ENCOUNTER — Encounter (INDEPENDENT_AMBULATORY_CARE_PROVIDER_SITE_OTHER): Payer: Self-pay | Admitting: Family Medicine

## 2021-11-14 ENCOUNTER — Ambulatory Visit (INDEPENDENT_AMBULATORY_CARE_PROVIDER_SITE_OTHER): Payer: BC Managed Care – PPO | Admitting: Family Medicine

## 2021-11-14 VITALS — BP 117/76 | HR 71 | Temp 98.2°F | Ht 65.0 in | Wt 304.0 lb

## 2021-11-14 DIAGNOSIS — E739 Lactose intolerance, unspecified: Secondary | ICD-10-CM

## 2021-11-14 DIAGNOSIS — R7303 Prediabetes: Secondary | ICD-10-CM | POA: Insufficient documentation

## 2021-11-14 DIAGNOSIS — E538 Deficiency of other specified B group vitamins: Secondary | ICD-10-CM

## 2021-11-14 DIAGNOSIS — Z6841 Body Mass Index (BMI) 40.0 and over, adult: Secondary | ICD-10-CM

## 2021-11-14 DIAGNOSIS — Z0289 Encounter for other administrative examinations: Secondary | ICD-10-CM

## 2021-11-14 DIAGNOSIS — Z9884 Bariatric surgery status: Secondary | ICD-10-CM | POA: Insufficient documentation

## 2021-11-14 MED FILL — Iron Sucrose Inj 20 MG/ML (Fe Equiv): INTRAVENOUS | Qty: 20 | Status: AC

## 2021-11-14 NOTE — Progress Notes (Unsigned)
  Office: [phone]  /  Fax: [phone]  Program Consultation  Tracy [name] was seen in clinic today to discuss her diagnosis of obesity. We did a program consultation to discuss her options.  We discussed obesity as a disease and the importance of a proper evaluation of all the factors contributing to the disease.  We discussed the importance of long term lifestyle changes which include nutrition, exercise and behavioral modifications as well as the importance of customizing this to her specific health and social needs.  Current health conditions we discussed and we expect to improve include prediabetes, B12 deficiency and lactose intolerance.  We discussed the goals of this program is to improve her overall health and not simply achieve a specific BMI.  Frequent visit are very important to patient success. I plan to see her every 2 weeks for the first 3 months and then evaluate the visit frequency after that time. I explained obesity is a life-long chronic disease and long term treatments would be required. Medications to help her follow his eating plan may be offered as appropriate but are not required. All medication decisions will be made together after benefits and side effects are discussed in depth.  The clinic rules were reviewed including the late policy, cancellation policy and the no show fees. The $99 program fee was discussed as well.  Tracy [name] appears to be in the action stage of change and  is approved to start the program   50 minutes was spent today on this visit including the above counseling, pre-visit chart review, and post-visit documentation.

## 2021-11-14 NOTE — Telephone Encounter (Signed)
Call made to pt, confirmed DOB. Made of AO recommendations. Declines cpap at this time will try lifestyle changes. Just joined weight loss clinic.   Will send to AO as FYI.   Nothing further needed at this time.

## 2021-11-15 ENCOUNTER — Inpatient Hospital Stay: Payer: BC Managed Care – PPO | Attending: Radiation Oncology

## 2021-11-15 VITALS — BP 134/72 | HR 68 | Temp 98.5°F | Resp 18

## 2021-11-15 DIAGNOSIS — D508 Other iron deficiency anemias: Secondary | ICD-10-CM | POA: Insufficient documentation

## 2021-11-15 DIAGNOSIS — E538 Deficiency of other specified B group vitamins: Secondary | ICD-10-CM | POA: Diagnosis not present

## 2021-11-15 DIAGNOSIS — C50212 Malignant neoplasm of upper-inner quadrant of left female breast: Secondary | ICD-10-CM | POA: Insufficient documentation

## 2021-11-15 DIAGNOSIS — Z170 Estrogen receptor positive status [ER+]: Secondary | ICD-10-CM | POA: Diagnosis not present

## 2021-11-15 DIAGNOSIS — Z9884 Bariatric surgery status: Secondary | ICD-10-CM | POA: Diagnosis not present

## 2021-11-15 MED ORDER — SODIUM CHLORIDE 0.9 % IV SOLN
400.0000 mg | Freq: Once | INTRAVENOUS | Status: AC
  Administered 2021-11-15: 400 mg via INTRAVENOUS
  Filled 2021-11-15: qty 20

## 2021-11-15 MED ORDER — SODIUM CHLORIDE 0.9 % IV SOLN: Freq: Once | INTRAVENOUS | Status: AC

## 2021-11-15 MED ORDER — LORATADINE 10 MG PO TABS
10.0000 mg | ORAL_TABLET | Freq: Every day | ORAL | Status: DC
  Administered 2021-11-15: 10 mg via ORAL
  Filled 2021-11-15: qty 1

## 2021-11-15 NOTE — Patient Instructions (Signed)

## 2021-11-15 NOTE — Progress Notes (Signed)
Patient tolerated her first dose of iron well. No signs of any reactions. VSS- BP 134/72 (BP Location: Right Arm, Patient Position: Sitting)   Pulse 68   Temp 98.5 F (36.9 C) (Oral)   Resp 18   LMP 07/20/2008   SpO2 97%   Patient was given food and fluids and she stayed for her 30 minute observation.

## 2021-11-18 DIAGNOSIS — F332 Major depressive disorder, recurrent severe without psychotic features: Secondary | ICD-10-CM | POA: Diagnosis not present

## 2021-11-19 ENCOUNTER — Ambulatory Visit (INDEPENDENT_AMBULATORY_CARE_PROVIDER_SITE_OTHER): Payer: BC Managed Care – PPO | Admitting: Family Medicine

## 2021-11-19 ENCOUNTER — Encounter (INDEPENDENT_AMBULATORY_CARE_PROVIDER_SITE_OTHER): Payer: Self-pay | Admitting: Family Medicine

## 2021-11-19 VITALS — BP 134/79 | HR 65 | Temp 97.9°F | Ht 65.0 in | Wt 305.0 lb

## 2021-11-19 DIAGNOSIS — Z1331 Encounter for screening for depression: Secondary | ICD-10-CM | POA: Insufficient documentation

## 2021-11-19 DIAGNOSIS — E559 Vitamin D deficiency, unspecified: Secondary | ICD-10-CM

## 2021-11-19 DIAGNOSIS — I10 Essential (primary) hypertension: Secondary | ICD-10-CM | POA: Diagnosis not present

## 2021-11-19 DIAGNOSIS — R7303 Prediabetes: Secondary | ICD-10-CM | POA: Diagnosis not present

## 2021-11-19 DIAGNOSIS — Z6841 Body Mass Index (BMI) 40.0 and over, adult: Secondary | ICD-10-CM

## 2021-11-19 DIAGNOSIS — R0602 Shortness of breath: Secondary | ICD-10-CM | POA: Diagnosis not present

## 2021-11-19 DIAGNOSIS — F3289 Other specified depressive episodes: Secondary | ICD-10-CM | POA: Diagnosis not present

## 2021-11-19 DIAGNOSIS — F32A Depression, unspecified: Secondary | ICD-10-CM | POA: Insufficient documentation

## 2021-11-19 DIAGNOSIS — R5383 Other fatigue: Secondary | ICD-10-CM | POA: Diagnosis not present

## 2021-11-20 LAB — T3: T3, Total: 120 ng/dL (ref 71–180)

## 2021-11-20 LAB — CMP14+EGFR
ALT: 29 IU/L (ref 0–32)
AST: 26 IU/L (ref 0–40)
Albumin/Globulin Ratio: 1.6 (ref 1.2–2.2)
Albumin: 4.2 g/dL (ref 3.8–4.9)
Alkaline Phosphatase: 98 IU/L (ref 44–121)
BUN/Creatinine Ratio: 15 (ref 9–23)
BUN: 9 mg/dL (ref 6–24)
Bilirubin Total: <0.2 mg/dL (ref 0.0–1.2)
CO2: 25 mmol/L (ref 20–29)
Calcium: 9.2 mg/dL (ref 8.7–10.2)
Chloride: 103 mmol/L (ref 96–106)
Creatinine, Ser: 0.61 mg/dL (ref 0.57–1.00)
Globulin, Total: 2.7 g/dL (ref 1.5–4.5)
Glucose: 79 mg/dL (ref 70–99)
Potassium: 3.6 mmol/L (ref 3.5–5.2)
Sodium: 144 mmol/L (ref 134–144)
Total Protein: 6.9 g/dL (ref 6.0–8.5)
eGFR: 106 mL/min/1.73

## 2021-11-20 LAB — CBC WITH DIFFERENTIAL
Basophils Absolute: 0.1 10*3/uL (ref 0.0–0.2)
Basos: 1 %
EOS (ABSOLUTE): 0.1 10*3/uL (ref 0.0–0.4)
Eos: 2 %
Hematocrit: 35.8 % (ref 34.0–46.6)
Hemoglobin: 11 g/dL — ABNORMAL LOW (ref 11.1–15.9)
Immature Grans (Abs): 0 10*3/uL (ref 0.0–0.1)
Immature Granulocytes: 0 %
Lymphocytes Absolute: 1.6 10*3/uL (ref 0.7–3.1)
Lymphs: 36 %
MCH: 23.8 pg — ABNORMAL LOW (ref 26.6–33.0)
MCHC: 30.7 g/dL — ABNORMAL LOW (ref 31.5–35.7)
MCV: 77 fL — ABNORMAL LOW (ref 79–97)
Monocytes Absolute: 0.3 10*3/uL (ref 0.1–0.9)
Monocytes: 7 %
Neutrophils Absolute: 2.4 10*3/uL (ref 1.4–7.0)
Neutrophils: 54 %
RBC: 4.63 x10E6/uL (ref 3.77–5.28)
RDW: 16.1 % — ABNORMAL HIGH (ref 11.7–15.4)
WBC: 4.6 10*3/uL (ref 3.4–10.8)

## 2021-11-20 LAB — LIPID PANEL WITH LDL/HDL RATIO
Cholesterol, Total: 193 mg/dL (ref 100–199)
HDL: 107 mg/dL
LDL Chol Calc (NIH): 73 mg/dL (ref 0–99)
LDL/HDL Ratio: 0.7 ratio (ref 0.0–3.2)
Triglycerides: 73 mg/dL (ref 0–149)
VLDL Cholesterol Cal: 13 mg/dL (ref 5–40)

## 2021-11-20 LAB — VITAMIN D 25 HYDROXY (VIT D DEFICIENCY, FRACTURES): Vit D, 25-Hydroxy: 12.6 ng/mL — ABNORMAL LOW (ref 30.0–100.0)

## 2021-11-20 LAB — MAGNESIUM: Magnesium: 1.9 mg/dL (ref 1.6–2.3)

## 2021-11-20 LAB — VITAMIN B12: Vitamin B-12: 779 pg/mL (ref 232–1245)

## 2021-11-20 LAB — HEMOGLOBIN A1C
Est. average glucose Bld gHb Est-mCnc: 114 mg/dL
Hgb A1c MFr Bld: 5.6 % (ref 4.8–5.6)

## 2021-11-20 LAB — TSH: TSH: 1.09 u[IU]/mL (ref 0.450–4.500)

## 2021-11-20 LAB — PARATHYROID HORMONE, INTACT (NO CA): PTH: 64 pg/mL (ref 15–65)

## 2021-11-20 LAB — INSULIN, RANDOM: INSULIN: 5.6 u[IU]/mL (ref 2.6–24.9)

## 2021-11-20 LAB — T4, FREE: Free T4: 1.09 ng/dL (ref 0.82–1.77)

## 2021-11-21 MED FILL — Iron Sucrose Inj 20 MG/ML (Fe Equiv): INTRAVENOUS | Qty: 20 | Status: AC

## 2021-11-22 ENCOUNTER — Inpatient Hospital Stay: Payer: BC Managed Care – PPO

## 2021-11-22 VITALS — BP 144/86 | HR 82 | Temp 98.5°F | Resp 18

## 2021-11-22 DIAGNOSIS — D508 Other iron deficiency anemias: Secondary | ICD-10-CM

## 2021-11-22 DIAGNOSIS — Z170 Estrogen receptor positive status [ER+]: Secondary | ICD-10-CM | POA: Diagnosis not present

## 2021-11-22 DIAGNOSIS — E538 Deficiency of other specified B group vitamins: Secondary | ICD-10-CM | POA: Diagnosis not present

## 2021-11-22 DIAGNOSIS — Z9884 Bariatric surgery status: Secondary | ICD-10-CM | POA: Diagnosis not present

## 2021-11-22 DIAGNOSIS — C50212 Malignant neoplasm of upper-inner quadrant of left female breast: Secondary | ICD-10-CM | POA: Diagnosis not present

## 2021-11-22 MED ORDER — SODIUM CHLORIDE 0.9 % IV SOLN: Freq: Once | INTRAVENOUS | Status: AC

## 2021-11-22 MED ORDER — SODIUM CHLORIDE 0.9 % IV SOLN
400.0000 mg | Freq: Once | INTRAVENOUS | Status: AC
  Administered 2021-11-22: 400 mg via INTRAVENOUS
  Filled 2021-11-22: qty 20

## 2021-11-22 NOTE — Progress Notes (Signed)
Patient reports she has taken Claritin at home PO today prior to arrival.

## 2021-11-26 DIAGNOSIS — F419 Anxiety disorder, unspecified: Secondary | ICD-10-CM | POA: Diagnosis not present

## 2021-11-26 DIAGNOSIS — I10 Essential (primary) hypertension: Secondary | ICD-10-CM | POA: Diagnosis not present

## 2021-11-26 DIAGNOSIS — F1091 Alcohol use, unspecified, in remission: Secondary | ICD-10-CM | POA: Diagnosis not present

## 2021-11-26 DIAGNOSIS — F332 Major depressive disorder, recurrent severe without psychotic features: Secondary | ICD-10-CM | POA: Diagnosis not present

## 2021-11-26 DIAGNOSIS — Z23 Encounter for immunization: Secondary | ICD-10-CM | POA: Diagnosis not present

## 2021-12-01 DIAGNOSIS — F332 Major depressive disorder, recurrent severe without psychotic features: Secondary | ICD-10-CM | POA: Diagnosis not present

## 2021-12-01 NOTE — Progress Notes (Unsigned)
Chief Complaint:   OBESITY Tracy [name] (MR# [id number]) is a 56 y.o. female who presents for evaluation [name] treatment of obesity [name] related comorbidities. Current BMI is Body mass index is 50.75 kg/m. Tracy [name] has been struggling with her weight for many years [name] has been unsuccessful in either losing weight, maintaining weight loss, or reaching her healthy weight goal.  Tracy [name] has a history of gastric bypass in 2010.  Her preop weight was 341 pounds, [name] decreased down to 200 pounds in 7 months.  Started regaining a total of 100 pounds.  Has some postop complications.  She feels sick when eating rice [name] pasta (causes pain [name] relieved with vomiting).  Tracy [name] is currently in the action stage of change [name] ready to dedicate time achieving [name] maintaining a healthier weight. Tracy [name] is interested in becoming Tracy patient [name] working on intensive lifestyle modifications including (but not limited to) diet [name] exercise for weight loss.  Tracy [name]'s habits were reviewed today [name] are as follows: Her family eats meals together, she thinks her family will eat healthier with her, she struggles with family [name] or coworkers weight loss sabotage, her desired weight loss is 80-105 lbs, she has been heavy most of her life, she started gaining weight in her 20's, her heaviest weight ever was 355 pounds, she is a picky eater [name] doesn't like to eat healthier foods, she has significant food cravings issues, she snacks frequently in the evenings, she skips meals frequently, she is frequently drinking liquids with calories, she frequently makes poor food choices, she has problems with excessive hunger, she frequently eats larger portions than normal, [name] she struggles with emotional eating.  Depression Screen Tracy [name]'s Food [name] Mood (modified PHQ-9) score was 16.      No data to display         Subjective:   1. Other fatigue Tracy [name] admits to daytime somnolence [name] admits to waking up still tired. Patient has a  history of symptoms of daytime fatigue, morning fatigue, [name] morning headache. Tracy [name] generally gets 6 or 7 hours of sleep per night, [name] states that she has nightime awakenings. Snoring is present. Apneic episodes are present. Epworth Sleepiness Score is 4.   2. SOBOE (shortness of breath on exertion) Tracy [name] notes increasing shortness of breath with mild to moderate physical activity [name] seems to be worsening over time with weight gain. She notes getting out of breath sooner with activity than she used to. This has not gotten worse recently. Tracy [name] denies shortness of breath at rest or orthopnea.  3. Hypertension, unspecified type Tracy [name]'s blood pressure is close to goal.  She is on HCTZ, with no orthostasis.  4. Prediabetes Tracy [name] has a history of prediabetes.  Asymptomatic with no recent A1c on record.  Blood glucose range in the 100s.  5. Vitamin D deficiency Tracy [name] is asymptomatic, [name] she is status post gastric bypass.  6. Other depression, emotional eating disorder Tracy [name] acknowledges eating pallatable food when stressed.  Some food associated with abdominal pain, relieved by vomiting.  Assessment/Plan:   1. Other fatigue Tracy [name] does feel that her weight is causing her energy to be lower than it should be. Fatigue may be related to obesity, depression or many other causes. Labs will be ordered, [name] in the meanwhile, Tracy [name] will focus on self care including making healthy food choices, increasing physical activity [name] focusing on stress reduction.  - EKG 12-Lead - CBC With Differential - CMP14+EGFR - Lipid Panel With LDL/HDL Ratio -  Magnesium - T3 - T4, free - TSH - Vitamin B12 - Parathyroid hormone, intact (no Ca)  2. SOBOE (shortness of breath on exertion) Tracy [name] does feel that she gets out of breath more easily that she used to when she exercises. Tracy [name]'s shortness of breath appears to be obesity related [name] exercise induced. She has agreed to work on weight loss [name] gradually increase exercise  to treat her exercise induced shortness of breath. Will continue to monitor closely.  3. Hypertension, unspecified type Tracy [name] will continue her medications [name] weight loss, [name] we will check renal parameters.  4. Prediabetes Tracy [name] will continue to work on weight loss, exercise, [name] decreasing simple carbohydrates to help decrease the risk of diabetes.   - Hemoglobin A1c - Insulin, random  5. Vitamin D deficiency We will check labs today.  Tracy [name] will follow-up for routine testing of Vitamin D, at least 2-3 times per year to avoid over-replacement.  - VITAMIN D 25 Hydroxy (Vit-D Deficiency, Fractures)  6. Other depression, emotional eating disorder Tracy on spousal support was provided.  We will refer Tracy [name] to Dr. Mallie [name], Tracy bariatric psychologist for behavioral consult/therapy.  Patient is established with psych for depression [name] anxiety.  7. Depression screening Tracy [name] had a positive depression screening. Depression is commonly associated with obesity [name] often results in emotional eating behaviors. We will monitor this closely [name] work on CBT to help improve the non-hunger eating patterns. Referral to Psychology may be required if no improvement is seen as she continues in Tracy clinic.  8. Class 3 severe obesity with serious comorbidity [name] body mass index (BMI) of 50.0 to 59.9 in adult, unspecified obesity type (HCC) Tracy [name] is currently in the action stage of change [name] her goal is to continue with weight loss efforts. I recommend Tracy [name] begin the structured treatment plan as follows:  She has agreed to the Category 2 Plan.  Exercise goals: No exercise has been prescribed for now, while we concentrate on nutritional changes.    Behavioral modification strategies: meal planning [name] cooking strategies, keeping healthy foods in the home, emotional eating strategies, [name] travel eating strategies.  She was informed of the importance of frequent follow-up visits to maximize her success with  intensive lifestyle modifications for her multiple health conditions. She was informed we would discuss her lab results at her next visit unless there is a critical issue that needs to be addressed sooner. Teana agreed to keep her next visit at the agreed upon time to discuss these results.  Objective:   Blood pressure 134/79, pulse 65, temperature 97.9 F (36.6 C), height '5\' 5"'  (1.651 m), weight (!) 305 lb (138.3 kg), last menstrual period 07/20/2008, SpO2 97 %. Body mass index is 50.75 kg/m.  EKG: Normal sinus rhythm, rate 69 BPM.  Indirect Calorimeter completed today shows a VO2 of 329 [name] a REE of 2275.  Her calculated basal metabolic rate is 7253 thus her basal metabolic rate is better than expected.  General: Cooperative, alert, well developed, in no acute distress. HEENT: Conjunctivae [name] lids unremarkable. Cardiovascular: Regular rhythm.  Lungs: Normal work of breathing. Neurologic: No focal deficits.   Lab Results  Component Value Date   CREATININE 0.61 11/19/2021   BUN 9 11/19/2021   NA 144 11/19/2021   K 3.6 11/19/2021   CL 103 11/19/2021   CO2 25 11/19/2021   Lab Results  Component Value Date   ALT 29 11/19/2021   AST 26 11/19/2021   ALKPHOS 98 11/19/2021   BILITOT <  0.2 11/19/2021   Lab Results  Component Value Date   HGBA1C 5.6 11/19/2021   Lab Results  Component Value Date   INSULIN 5.6 11/19/2021   Lab Results  Component Value Date   TSH 1.090 11/19/2021   Lab Results  Component Value Date   CHOL 193 11/19/2021   HDL 107 11/19/2021   LDLCALC 73 11/19/2021   TRIG 73 11/19/2021   Lab Results  Component Value Date   WBC 4.6 11/19/2021   HGB 11.0 (L) 11/19/2021   HCT 35.8 11/19/2021   MCV 77 (L) 11/19/2021   PLT 301 11/07/2021   Lab Results  Component Value Date   IRON 15 (L) 11/07/2021   TIBC 573 (H) 11/07/2021   FERRITIN 4 (L) 11/07/2021   Attestation Statements:   Reviewed by clinician on day of visit: allergies, medications, problem  list, medical history, surgical history, family history, social history, [name] previous encounter notes.  I have personally spent 50 minutes total time today in preparation, patient care, [name] documentation for this visit, including the following: review of clinical lab tests; review of medical tests/procedures/services.   I, Trixie Dredge, am acting as transcriptionist for Dennard Nip, MD.  I have reviewed the above documentation for accuracy [name] completeness, [name] I agree with the above. - Dennard Nip, MD

## 2021-12-03 ENCOUNTER — Encounter (INDEPENDENT_AMBULATORY_CARE_PROVIDER_SITE_OTHER): Payer: Self-pay | Admitting: Family Medicine

## 2021-12-03 ENCOUNTER — Ambulatory Visit (INDEPENDENT_AMBULATORY_CARE_PROVIDER_SITE_OTHER): Payer: BC Managed Care – PPO | Admitting: Family Medicine

## 2021-12-03 VITALS — BP 130/67 | HR 95 | Temp 98.1°F | Ht 65.0 in | Wt 294.0 lb

## 2021-12-03 DIAGNOSIS — E88819 Insulin resistance, unspecified: Secondary | ICD-10-CM

## 2021-12-03 DIAGNOSIS — Z6841 Body Mass Index (BMI) 40.0 and over, adult: Secondary | ICD-10-CM

## 2021-12-03 DIAGNOSIS — E8881 Metabolic syndrome: Secondary | ICD-10-CM | POA: Diagnosis not present

## 2021-12-03 DIAGNOSIS — E669 Obesity, unspecified: Secondary | ICD-10-CM | POA: Diagnosis not present

## 2021-12-03 DIAGNOSIS — K5909 Other constipation: Secondary | ICD-10-CM | POA: Diagnosis not present

## 2021-12-03 DIAGNOSIS — E559 Vitamin D deficiency, unspecified: Secondary | ICD-10-CM | POA: Diagnosis not present

## 2021-12-03 MED ORDER — VITAMIN D (ERGOCALCIFEROL) 1.25 MG (50000 UNIT) PO CAPS: 50000.0000 [IU] | ORAL_CAPSULE | ORAL | 0 refills | Status: DC

## 2021-12-08 ENCOUNTER — Encounter: Payer: Self-pay | Admitting: Pulmonary Disease

## 2021-12-08 ENCOUNTER — Ambulatory Visit (INDEPENDENT_AMBULATORY_CARE_PROVIDER_SITE_OTHER): Payer: BC Managed Care – PPO | Admitting: Pulmonary Disease

## 2021-12-08 VITALS — BP 122/78 | HR 76 | Temp 98.1°F | Ht 65.0 in | Wt 298.8 lb

## 2021-12-08 DIAGNOSIS — F5102 Adjustment insomnia: Secondary | ICD-10-CM | POA: Diagnosis not present

## 2021-12-08 DIAGNOSIS — R0683 Snoring: Secondary | ICD-10-CM

## 2021-12-08 NOTE — Patient Instructions (Addendum)
Prescription for Ambien sent to pharmacy for you for insomnia -You may cut the dose in half if it is making you feel groggy in the mornings -Dedicate about 6 hours for sleep  Continue working on weight loss efforts  Call us with significant concerns  A good amount of weight loss will definitely help the mild sleep apnea  I will see you back in about 6 months

## 2021-12-08 NOTE — Progress Notes (Unsigned)
Tracy [name]    [id number]    05-18-1965  Primary Care Physician:Feng, Krista Blue, MD  Referring Physician: [facility] [place],  [place] [address]  Chief complaint:   History of snoring Has had sleep study in the past showing mild obstructive sleep apnea  HPI:  Having more problems with insomnia  Most recent sleep study shows mild obstructive sleep apnea A previous study and also showed mild sleep apnea  Has managed to lose about 12 pounds recently with effort, Weight management program  Treated for breast cancer, follows up with oncology  Usually tries to go to bed between 930 and 10, falls asleep in about 45 minutes 3-4 awakenings Final wake up time about 7 AM  Admits to some dryness of her mouth in the morning Occasional headaches Occasional choking gasping episodes at night, night sweats  Never smoked No pets  Dad snored  Has a history of asthma  Outpatient Encounter Medications as of 12/08/2021  Medication Sig   acetaminophen (TYLENOL) 500 MG tablet Take 1,000 mg by mouth every 6 (six) hours as needed for mild pain.   albuterol (PROVENTIL) (2.5 MG/3ML) 0.083% nebulizer solution Take 2.5 mg by nebulization every 6 (six) hours as needed for wheezing or shortness of breath.   albuterol (VENTOLIN HFA) 108 (90 Base) MCG/ACT inhaler Inhale 2 puffs into the lungs every 6 (six) hours as needed for wheezing or shortness of breath.   ALPRAZolam (XANAX) 0.5 MG tablet Take 0.5 tablets by mouth 2 (two) times daily as needed for anxiety.    amphetamine-dextroamphetamine (ADDERALL) 10 MG tablet Take 10 mg by mouth 2 (two) times daily.   azelastine (ASTELIN) 0.1 % nasal spray Place 1 spray into both nostrils daily as needed for allergies or rhinitis.   cetirizine (ZYRTEC) 10 MG tablet Take 10 mg by mouth daily as needed (allergies).   Cyanocobalamin 1000 MCG/ML KIT Inject 1,000 mcg as directed every 30 (thirty) days.   desvenlafaxine (PRISTIQ) 50  MG 24 hr tablet Take 50 mg by mouth daily.   gabapentin (NEURONTIN) 100 MG capsule Take 1 capsule (100 mg total) by mouth at bedtime. OK to increase dose to 3 capsules at night in 2-3 weeks if needed for hot flushes   hydrochlorothiazide (MICROZIDE) 12.5 MG capsule Take 12.5 mg by mouth daily.   lamoTRIgine (LAMICTAL) 100 MG tablet Take 200 mg by mouth 2 (two) times daily.   SPRAVATO, 84 MG DOSE, 28 MG/DEVICE SOPK Place into both nostrils.   tamoxifen (NOLVADEX) 20 MG tablet TAKE 1 TABLET(20 MG) BY MOUTH DAILY   valACYclovir (VALTREX) 1000 MG tablet Take 1,000 mg by mouth daily as needed (cold sores).    Vitamin D, Ergocalciferol, (DRISDOL) 1.25 MG (50000 UNIT) CAPS capsule Take 1 capsule (50,000 Units total) by mouth every 7 (seven) days.   No facility-administered encounter medications on file as of 12/08/2021.    Allergies as of 12/08/2021 - Review Complete 12/08/2021  Allergen Reaction Noted   Ibuprofen Other (See Comments) 03/06/2016   Morphine Hives 03/30/2014   Doxepin Other (See Comments) 05/11/2016   Nsaids Other (See Comments) 01/29/2015   Tramadol Nausea Only 02/03/2021   Trazodone Other (See Comments) 05/11/2016    Past Medical History:  Diagnosis Date   ADHD    Alcohol abuse    Anemia    Anxiety    Anxiety and depression    Arthritis    left knee   Asthma  hardly uses inhaler   B12 deficiency    Cancer ([place])    Constipation    Depression    Diverticulosis    Fatty liver 2012   pt states she was told this once   Headache    Migraines   Heart murmur    pt was told this, no echo   History of stomach ulcers    Hypertension    history no longer requires medication management   IBS (irritable bowel syndrome)    IDA (iron deficiency anemia)    Infertility, female    Joint pain    Lactose intolerance    Melasma    Palpitations    Perforated ulcer ([place])    Pre-diabetes    PUD (peptic ulcer disease)    Sleep apnea    mild, no CPAP ordered   SOBOE  (shortness of breath on exertion)    Vitamin D deficiency     Past Surgical History:  Procedure Laterality Date   ABDOMINAL HYSTERECTOMY     ANKLE RECONSTRUCTION Left 09/09/2018   Procedure: BROSTRUM-GOULD LEFT;  Surgeon: Samara Deist, DPM;  Location: [facility];  Service: Podiatry;  Laterality: Left;   BREAST LUMPECTOMY WITH RADIOACTIVE SEED AND SENTINEL LYMPH NODE BIOPSY Left 02/25/2021   Procedure: LEFT BREAST LUMPECTOMY WITH RADIOACTIVE SEED AND SENTINEL LYMPH NODE BIOPSY;  Surgeon: Erroll Luna, MD;  Location: [facility];  Service: General;  Laterality: Left;   CHOLECYSTECTOMY     COLONOSCOPY     CYSTOSCOPY N/A 08/05/2016   Procedure: CYSTOSCOPY;  Surgeon: Bobbye Charleston, MD;  Location: [facility];  Service: Gynecology;  Laterality: N/A;   ENDOMETRIAL ABLATION W/ NOVASURE     GASTRIC BYPASS OPEN     LYSIS OF ADHESION N/A 08/05/2016   Procedure: LYSIS OF ADHESION;  Surgeon: Bobbye Charleston, MD;  Location: [facility];  Service: Gynecology;  Laterality: N/A;   perforated ulcer surgery     ROBOTIC ASSISTED SALPINGO OOPHERECTOMY Right 05/12/2017   Procedure: XI ROBOTIC ASSISTED RIGHT OOPHORECTOMY, Fulgeration of Endometriosis, Peritoneal Biopsy;  Surgeon: Bobbye Charleston, MD;  Location: [facility];  Service: Gynecology;  Laterality: Right;   ROBOTIC ASSISTED TOTAL HYSTERECTOMY WITH SALPINGECTOMY Bilateral 08/05/2016   Procedure: ROBOTIC ASSISTED TOTAL HYSTERECTOMY WITH SALPINGECTOMY;  Surgeon: Bobbye Charleston, MD;  Location: [facility];  Service: Gynecology;  Laterality: Bilateral;   TENDON REPAIR Left 09/09/2018   Procedure: TENOLYSIS, MULTIPLE;  Surgeon: Samara Deist, DPM;  Location: [facility];  Service: Podiatry;  Laterality: Left;   TONSILLECTOMY     UPPER GI ENDOSCOPY      Family History  Problem Relation Age of Onset   Diabetes Mother    COPD Mother    Anxiety disorder Mother    Depression Mother    Diabetes type II Mother    Obesity Mother    Crohn's disease Mother    Bipolar  disorder Mother    Coronary artery disease Father    Diabetes type II Father    Hypertension Father    Pancreatic cancer Sister 36       died   Obesity Sister    Breast cancer Maternal Aunt 31   Depression Maternal Grandmother    Obesity Maternal Grandmother    Cancer Maternal Grandmother    Coronary artery disease Maternal Grandmother    Glaucoma Paternal Grandmother    Hypertension Paternal Grandmother    Osteoarthritis Paternal Grandmother    Skin cancer Paternal Grandmother    Coronary artery disease Paternal Grandfather    Leukemia Paternal  Grandfather     Social History   Socioeconomic History   Marital status: Married    Spouse name: Not on file   Number of children: Not on file   Years of education: Not on file   Highest education level: Not on file  Occupational History   Not on file  Tobacco Use   Smoking status: Never   Smokeless tobacco: Never  Vaping Use   Vaping Use: Never used  Substance and Sexual Activity   Alcohol use: Yes    Alcohol/week: 4.0 standard drinks of alcohol    Types: 4 Glasses of wine per week   Drug use: No   Sexual activity: Never  Other Topics Concern   Not on file  Social History Narrative   Not on file   Social Determinants of Health   Financial Resource Strain: Not on file  Food Insecurity: Not on file  Transportation Needs: Not on file  Physical Activity: Not on file  Stress: Not on file  Social Connections: Not on file  Intimate Partner Violence: Not on file    Review of Systems  Respiratory:  Positive for apnea.   Psychiatric/Behavioral:  Positive for sleep disturbance.     Vitals:   12/08/21 1111  BP: 122/78  Pulse: 76  Temp: 98.1 F (36.7 C)  SpO2: 98%     Physical Exam Constitutional:      Appearance: She is obese.  HENT:     Head: Normocephalic.     Nose: Nose normal.     Mouth/Throat:     Mouth: Mucous membranes are moist.     Comments: Mallampati 4, crowded oropharynx Cardiovascular:      Rate and Rhythm: Normal rate and regular rhythm.     Heart sounds: No murmur heard.    No friction rub.  Pulmonary:     Effort: No respiratory distress.     Breath sounds: No stridor. No wheezing or rhonchi.  Musculoskeletal:     Cervical back: Normal range of motion. No rigidity or tenderness.  Neurological:     Mental Status: She is alert.      09/04/2021   10:00 AM  Results of the Epworth flowsheet  Sitting and reading 1  Watching TV 1  Sitting, inactive in a public place (e.g. a theatre or a meeting) 0  As a passenger in a car for an hour without a break 0  Lying down to rest in the afternoon when circumstances permit 0  Sitting and talking to someone 0  Sitting quietly after a lunch without alcohol 0  In a car, while stopped for a few minutes in traffic 0  Total score 2   Data Reviewed: Previous sleep study not available for review  Assessment:  History of asthma -Albuterol as needed  History of insomnia -Over-the-counter medications have not really helped -We will try Ambien  Mild obstructive sleep apnea  History of breast cancer -Follows with oncology  Plan/Recommendations: Continue weight loss efforts  Prescription for Ambien to be tried for insomnia as she is having more issues with insomnia  Graded activities as tolerated  Follow-up in 6 months  Call with significant concerns  Sherrilyn Rist MD [facility] 12/08/2021, 11:15 AM  CC: [place]

## 2021-12-09 ENCOUNTER — Telehealth: Payer: Self-pay | Admitting: Pulmonary Disease

## 2021-12-09 ENCOUNTER — Encounter: Payer: Self-pay | Admitting: Pulmonary Disease

## 2021-12-09 NOTE — Progress Notes (Signed)
Chief Complaint:   OBESITY Tracy [name] is here to discuss her progress with her obesity treatment plan along with follow-up of her obesity related diagnoses. Tracy [name] is on the Category 2 Plan and states she is following her eating plan approximately 100% of the time. Tracy [name] states she is active while walking with the baby.   Today's visit was #: 2 Starting weight: 305 lbs Starting date: 11/19/2021 Today's weight: 294 lbs Today's date: 12/03/2021 Total lbs lost to date: 11 Total lbs lost since last in-office visit: 11  Interim History: [name] has done well with weight loss. She has tried hard to follow her plan, but she has  not always eaten all the food on her plan.   Subjective:   1. Vitamin D deficiency Tracy [name] has a new diagnosis. Her Vitamin D level is low and she notes fatigue. She is not on Vitamin D currently. I discussed labs with the patient today.   2. Insulin resistance Tracy [name] has a history of fluctuating glucose levels and AM anorexia with PM polyphagia. She has done well with her diet and weight loss. I discussed labs with the patient today.  3. Other constipation Tracy [name] notes decreased BM frequency on her eating plan. She hasn't tried any OTC medications yet.   Assessment/Plan:   1. Vitamin D deficiency Tracy [name] agreed to start prescription Vitamin D 50,000 IU every week, with no refills. We will recheck labs in 3 months. She will follow-up for routine testing of Vitamin D, at least 2-3 times per year to avoid over-replacement.  - Vitamin D, Ergocalciferol, (DRISDOL) 1.25 MG (50000 UNIT) CAPS capsule; Take 1 capsule (50,000 Units total) by mouth every 7 (seven) days.  Dispense: 4 capsule; Refill: 0  2. Insulin resistance Tracy [name] will continue her diet, exercise, and decreasing simple carbohydrates to help decrease the risk of diabetes. We will recheck labs in 3 months. Tracy [name] agreed to follow-up with [place] as directed to closely monitor her progress.  3. Other constipation Tracy [name] agreed to start  OTC miralax 17 grams daily, and she is to increase her water intake. Tracy [name] was informed that a decrease in bowel movement frequency is normal while losing weight, but stools should not be hard or painful. Orders and follow up as documented in patient record.   4. Obesity, Current BMI 49.1 Tracy [name] is currently in the action stage of change. As such, her goal is to continue with weight loss efforts. She has agreed to the Category 2 Plan.   Lean meat equivalents were discussed.   Behavioral modification strategies: increasing lean protein intake and no skipping meals.  Tracy [name] has agreed to follow-up with our clinic in 2 weeks. She was informed of the importance of frequent follow-up visits to maximize her success with intensive lifestyle modifications for her multiple health conditions.   Objective:   Blood pressure 130/67, pulse 95, temperature 98.1 F (36.7 C), height '5\' 5"'$  (1.651 m), weight 294 lb (133.4 kg), last menstrual period 07/20/2008, SpO2 94 %. Body mass index is 48.92 kg/m.  General: Cooperative, alert, well developed, in no acute distress. HEENT: Conjunctivae and lids unremarkable. Cardiovascular: Regular rhythm.  Lungs: Normal work of breathing. Neurologic: No focal deficits.   Lab Results  Component Value Date   CREATININE 0.61 11/19/2021   BUN 9 11/19/2021   NA 144 11/19/2021   K 3.6 11/19/2021   CL 103 11/19/2021   CO2 25 11/19/2021   Lab Results  Component Value Date   ALT 29 11/19/2021  AST 26 11/19/2021   ALKPHOS 98 11/19/2021   BILITOT <0.2 11/19/2021   Lab Results  Component Value Date   HGBA1C 5.6 11/19/2021   Lab Results  Component Value Date   INSULIN 5.6 11/19/2021   Lab Results  Component Value Date   TSH 1.090 11/19/2021   Lab Results  Component Value Date   CHOL 193 11/19/2021   HDL 107 11/19/2021   LDLCALC 73 11/19/2021   TRIG 73 11/19/2021   Lab Results  Component Value Date   VD25OH 12.6 (L) 11/19/2021   Lab Results  Component  Value Date   WBC 4.6 11/19/2021   HGB 11.0 (L) 11/19/2021   HCT 35.8 11/19/2021   MCV 77 (L) 11/19/2021   PLT 301 11/07/2021   Lab Results  Component Value Date   IRON 15 (L) 11/07/2021   TIBC 573 (H) 11/07/2021   FERRITIN 4 (L) 11/07/2021   Attestation Statements:   Reviewed by clinician on day of visit: allergies, medications, problem list, medical history, surgical history, family history, social history, and previous encounter notes.  Time spent on visit including pre-visit chart review and post-visit care and charting was 46 minutes.   I, Trixie Dredge, am acting as transcriptionist for Dennard Nip, MD.  I have reviewed the above documentation for accuracy and completeness, and I agree with the above. -  Dennard Nip, MD

## 2021-12-09 NOTE — Telephone Encounter (Signed)
Please advise on what strength and directions for Ambien RX or send to pharmacy.

## 2021-12-10 DIAGNOSIS — F332 Major depressive disorder, recurrent severe without psychotic features: Secondary | ICD-10-CM | POA: Diagnosis not present

## 2021-12-10 MED ORDER — ZOLPIDEM TARTRATE 10 MG PO TABS: 10.0000 mg | ORAL_TABLET | Freq: Every evening | ORAL | 2 refills | Status: DC | PRN

## 2021-12-10 NOTE — Telephone Encounter (Signed)
Patient checking on message for Ambien. Patient leaving tomorrow going out of town. Patient phone number is [phone].

## 2021-12-10 NOTE — Telephone Encounter (Signed)
Patient checking on message for Ambien. Patient phone number is [phone].

## 2021-12-11 DIAGNOSIS — F332 Major depressive disorder, recurrent severe without psychotic features: Secondary | ICD-10-CM | POA: Diagnosis not present

## 2021-12-11 DIAGNOSIS — G894 Chronic pain syndrome: Secondary | ICD-10-CM | POA: Diagnosis not present

## 2021-12-11 NOTE — Telephone Encounter (Signed)
Spoke with pt who picked up Ambien last night at 8:30 pm. Nothing further needed at this time.

## 2021-12-11 NOTE — Telephone Encounter (Signed)
Yes.  Ambien was sent to pharmacy

## 2021-12-11 NOTE — Telephone Encounter (Signed)
ATC left detailed message on voicemail per DPR.  Dr. Ander Slade will you please confirm that Ambien was sent into pharmacy? Thank you.

## 2021-12-12 DIAGNOSIS — E538 Deficiency of other specified B group vitamins: Secondary | ICD-10-CM | POA: Diagnosis not present

## 2021-12-15 ENCOUNTER — Encounter: Payer: Self-pay | Admitting: Pulmonary Disease

## 2021-12-15 DIAGNOSIS — F332 Major depressive disorder, recurrent severe without psychotic features: Secondary | ICD-10-CM | POA: Diagnosis not present

## 2021-12-16 NOTE — Telephone Encounter (Signed)
Dr. Ander Slade, please advise on pt's request to change to Ambien 12.'5mg'$  extended release. Thanks.

## 2021-12-17 ENCOUNTER — Other Ambulatory Visit: Payer: Self-pay | Admitting: Pulmonary Disease

## 2021-12-17 MED ORDER — ZOLPIDEM TARTRATE ER 12.5 MG PO TBCR: 12.5000 mg | EXTENDED_RELEASE_TABLET | Freq: Every evening | ORAL | 1 refills | Status: DC | PRN

## 2021-12-17 NOTE — Telephone Encounter (Signed)
Prescription for Ambien 12.5 sent to pharmacy

## 2021-12-18 ENCOUNTER — Ambulatory Visit (INDEPENDENT_AMBULATORY_CARE_PROVIDER_SITE_OTHER): Payer: BC Managed Care – PPO | Admitting: Family Medicine

## 2021-12-18 ENCOUNTER — Encounter (INDEPENDENT_AMBULATORY_CARE_PROVIDER_SITE_OTHER): Payer: Self-pay | Admitting: Family Medicine

## 2021-12-18 VITALS — BP 119/79 | HR 76 | Temp 98.0°F | Ht 65.0 in | Wt 294.0 lb

## 2021-12-18 DIAGNOSIS — R7303 Prediabetes: Secondary | ICD-10-CM

## 2021-12-18 DIAGNOSIS — E669 Obesity, unspecified: Secondary | ICD-10-CM | POA: Diagnosis not present

## 2021-12-18 DIAGNOSIS — F3289 Other specified depressive episodes: Secondary | ICD-10-CM

## 2021-12-18 DIAGNOSIS — Z6841 Body Mass Index (BMI) 40.0 and over, adult: Secondary | ICD-10-CM

## 2021-12-18 DIAGNOSIS — E559 Vitamin D deficiency, unspecified: Secondary | ICD-10-CM | POA: Diagnosis not present

## 2021-12-22 DIAGNOSIS — F332 Major depressive disorder, recurrent severe without psychotic features: Secondary | ICD-10-CM | POA: Diagnosis not present

## 2021-12-24 NOTE — Progress Notes (Signed)
Chief Complaint:   OBESITY Tracy [name] is here to discuss her progress with her obesity treatment plan along with follow-up of her obesity related diagnoses. Tracy [name] is on the Category 2 Plan and states she is following her eating plan approximately 99% of the time. Tracy [name] states she is walking for 7 times per week.   Today's visit was #: 3 Starting weight: 305 lbs Starting date: /08/2021 Today's weight: 294 lbs Today's date: 12/18/2021 Total lbs lost to date: 11 Total lbs lost since last in-office visit: 0  Interim History: Tracy [name] has a history of gastric bypass in 2010, and she is working on getting back on track as she had regained 100 pounds. She reports being very stressed with custody concerns with her niece.  She struggled with stress eating, and she is struggling to eat all of the food on her category 2 plan.  She is ready to discuss options, and she is interested in journaling.  Subjective:   1. Prediabetes Tracy [name]'s most recent A1c was 5.6, at goal.  She continues to work on decreasing simple carbohydrates.  2. Vitamin D deficiency Tracy [name] is taking vitamin D 50,000 units once weekly.  Her last vitamin D level was 12.6, with no side effects noted.  3. Other depression, emotional eating behaviors Tracy [name] has been very stressed with family concerns, and she is feeling frustrated with slowness of weight loss.  We discussed changing her diet to journaling.  Assessment/Plan:   1. Prediabetes Tracy [name] will continue to work on diet, exercise, and decreasing simple carbohydrates to help decrease the risk of diabetes.   2. Vitamin D deficiency Tracy [name] will continue prescription Vitamin D 50,000 IU every week and will follow-up for routine testing of Vitamin D, at least 2-3 times per year to avoid over-replacement.  3. Other depression, emotional eating behaviors We discussed changing to journaling and if she still is having cravings concerns, we may consider medication options at her next visit.  4.  Obesity, Current BMI 49.1 Tracy [name] is currently in the action stage of change. As such, her goal is to continue with weight loss efforts. She has agreed to change to keeping a food journal and adhering to recommended goals of 1400-1700 calories and 100+ grams of protein daily.   We discussed beginning journaling using the app and she is ready to try journaling and bring her log to the office.  Exercise goals: As is.   Behavioral modification strategies: increasing lean protein intake, decreasing simple carbohydrates, celebration eating strategies, and keeping a strict food journal.  Tracy [name] has agreed to follow-up with our clinic in 2 weeks. She was informed of the importance of frequent follow-up visits to maximize her success with intensive lifestyle modifications for her multiple health conditions.   Objective:   Blood pressure 119/79, pulse 76, temperature 98 F (36.7 C), height '5\' 5"'$  (1.651 m), weight 294 lb (133.4 kg), last menstrual period 07/20/2008, SpO2 98 %. Body mass index is 48.92 kg/m.  General: Cooperative, alert, well developed, in no acute distress. HEENT: Conjunctivae and lids unremarkable. Cardiovascular: Regular rhythm.  Lungs: Normal work of breathing. Neurologic: No focal deficits.   Lab Results  Component Value Date   CREATININE 0.61 11/19/2021   BUN 9 11/19/2021   NA 144 11/19/2021   K 3.6 11/19/2021   CL 103 11/19/2021   CO2 25 11/19/2021   Lab Results  Component Value Date   ALT 29 11/19/2021   AST 26 11/19/2021   ALKPHOS 98 11/19/2021  BILITOT <0.2 11/19/2021   Lab Results  Component Value Date   HGBA1C 5.6 11/19/2021   Lab Results  Component Value Date   INSULIN 5.6 11/19/2021   Lab Results  Component Value Date   TSH 1.090 11/19/2021   Lab Results  Component Value Date   CHOL 193 11/19/2021   HDL 107 11/19/2021   LDLCALC 73 11/19/2021   TRIG 73 11/19/2021   Lab Results  Component Value Date   VD25OH 12.6 (L) 11/19/2021   Lab  Results  Component Value Date   WBC 4.6 11/19/2021   HGB 11.0 (L) 11/19/2021   HCT 35.8 11/19/2021   MCV 77 (L) 11/19/2021   PLT 301 11/07/2021   Lab Results  Component Value Date   IRON 15 (L) 11/07/2021   TIBC 573 (H) 11/07/2021   FERRITIN 4 (L) 11/07/2021   Attestation Statements:   Reviewed by clinician on day of visit: allergies, medications, problem list, medical history, surgical history, family history, social history, and previous encounter notes.   I, Trixie Dredge, am acting as transcriptionist for Dennard Nip, MD.  I have reviewed the above documentation for accuracy and completeness, and I agree with the above. -  Dennard Nip, MD

## 2021-12-29 DIAGNOSIS — F333 Major depressive disorder, recurrent, severe with psychotic symptoms: Secondary | ICD-10-CM | POA: Diagnosis not present

## 2021-12-31 ENCOUNTER — Other Ambulatory Visit (INDEPENDENT_AMBULATORY_CARE_PROVIDER_SITE_OTHER): Payer: Self-pay | Admitting: Family Medicine

## 2021-12-31 ENCOUNTER — Encounter (INDEPENDENT_AMBULATORY_CARE_PROVIDER_SITE_OTHER): Payer: Self-pay | Admitting: Family Medicine

## 2021-12-31 ENCOUNTER — Ambulatory Visit (INDEPENDENT_AMBULATORY_CARE_PROVIDER_SITE_OTHER): Payer: BC Managed Care – PPO | Admitting: Family Medicine

## 2021-12-31 VITALS — BP 136/72 | HR 69 | Temp 98.1°F | Ht 65.0 in | Wt 299.0 lb

## 2021-12-31 DIAGNOSIS — E669 Obesity, unspecified: Secondary | ICD-10-CM | POA: Diagnosis not present

## 2021-12-31 DIAGNOSIS — Z6841 Body Mass Index (BMI) 40.0 and over, adult: Secondary | ICD-10-CM

## 2021-12-31 DIAGNOSIS — F418 Other specified anxiety disorders: Secondary | ICD-10-CM | POA: Diagnosis not present

## 2021-12-31 DIAGNOSIS — E559 Vitamin D deficiency, unspecified: Secondary | ICD-10-CM | POA: Diagnosis not present

## 2021-12-31 MED ORDER — TOPIRAMATE 50 MG PO TABS: 50.0000 mg | ORAL_TABLET | Freq: Every day | ORAL | 0 refills | Status: DC

## 2021-12-31 MED ORDER — VITAMIN D (ERGOCALCIFEROL) 1.25 MG (50000 UNIT) PO CAPS: 50000.0000 [IU] | ORAL_CAPSULE | ORAL | 0 refills | Status: DC

## 2021-12-31 NOTE — Progress Notes (Signed)
Office: [phone]  /  Fax: [phone]   Total lbs lost to date: 6 Total lbs lost since last in-office visit: +5     BP 136/72   Pulse 69   Temp 98.1 F (36.7 C)   Ht '5\' 5"'$  (1.651 m)   Wt 299 lb (135.6 kg)   LMP 07/20/2008   SpO2 97%   BMI 49.76 kg/m  She was weighed on the bioimpedance scale:  Body mass index is 49.76 kg/m.  General:  Alert, oriented and cooperative. Patient is in no acute distress.  Mental Status: Normal mood and affect. Normal behavior. Normal judgment and thought content.        Patient past medical history includes:   Past Medical History:  Diagnosis Date   ADHD    Alcohol abuse    Anemia    Anxiety    Anxiety and depression    Arthritis    left knee   Asthma    hardly uses inhaler   B12 deficiency    Cancer ([place])    Constipation    Depression    Diverticulosis    Fatty liver 2012   pt states she was told this once   Headache    Migraines   Heart murmur    pt was told this, no echo   History of stomach ulcers    Hypertension    history no longer requires medication management   IBS (irritable bowel syndrome)    IDA (iron deficiency anemia)    Infertility, female    Joint pain    Lactose intolerance    Melasma    Palpitations    Perforated ulcer ([place])    Pre-diabetes    PUD (peptic ulcer disease)    Sleep apnea    mild, no CPAP ordered   SOBOE (shortness of breath on exertion)    Vitamin D deficiency    History of Present Illness The patient presents with a recent struggle with weight gain, which they attribute to stress and depression related to waiting for the custody of a three and a [age]-year-old child. The patient reports that on some days, they did not get out of bed due to depression. They acknowledge self-medicating with certain foods during this time. The patient has gained five pounds, half of which is water weight and the other half is real weight gain. Their goal is to consume 1400 to 1700 calories per day,  with 100 or more grams of protein. The patient has been journaling their food intake, including portions, calories, and protein content.  The patient has a history of vitamin D deficiency and is currently taking an extended-release medication three times a day. They also take Adderall as needed, approximately two times a week, to help with focus and productivity. The patient reports feeling irritable when the Adderall begins to wear off. They are also on Pristiq daily and Lamictal 200 mg twice a day.  Assessment & Plan Obesity: Patient has gained weight due to stress and emotional eating. -Encouraged patient to continue tracking food intake and maintain a diet high in protein. -Initiated Topiramate to help with emotional eating. Instructed patient to start with half a pill for the first week, then increase to a full pill if tolerated.  Depression with Anxiety: Patient reports days of not getting out of bed due to depression. -Continue monitoring. If symptoms persist or worsen, consider referral to mental health professional or change therapy.  Vitamin D Deficiency: Patient's last Vitamin D level was  low. -refill Vitamin D supplementation.  Follow-up in 2 weeks to assess response to Topiramate and overall progress.

## 2022-01-07 ENCOUNTER — Other Ambulatory Visit: Payer: Self-pay

## 2022-01-07 DIAGNOSIS — Z01419 Encounter for gynecological examination (general) (routine) without abnormal findings: Secondary | ICD-10-CM | POA: Diagnosis not present

## 2022-01-07 DIAGNOSIS — F332 Major depressive disorder, recurrent severe without psychotic features: Secondary | ICD-10-CM | POA: Diagnosis not present

## 2022-01-07 DIAGNOSIS — Z6841 Body Mass Index (BMI) 40.0 and over, adult: Secondary | ICD-10-CM | POA: Diagnosis not present

## 2022-01-12 DIAGNOSIS — F333 Major depressive disorder, recurrent, severe with psychotic symptoms: Secondary | ICD-10-CM | POA: Diagnosis not present

## 2022-01-13 DIAGNOSIS — Z5181 Encounter for therapeutic drug level monitoring: Secondary | ICD-10-CM | POA: Diagnosis not present

## 2022-01-13 DIAGNOSIS — Z79891 Long term (current) use of opiate analgesic: Secondary | ICD-10-CM | POA: Diagnosis not present

## 2022-01-13 DIAGNOSIS — F332 Major depressive disorder, recurrent severe without psychotic features: Secondary | ICD-10-CM | POA: Diagnosis not present

## 2022-01-14 DIAGNOSIS — G5731 Lesion of lateral popliteal nerve, right lower limb: Secondary | ICD-10-CM | POA: Diagnosis not present

## 2022-01-14 DIAGNOSIS — G8929 Other chronic pain: Secondary | ICD-10-CM | POA: Diagnosis not present

## 2022-01-14 DIAGNOSIS — M79671 Pain in right foot: Secondary | ICD-10-CM | POA: Diagnosis not present

## 2022-01-14 DIAGNOSIS — M5442 Lumbago with sciatica, left side: Secondary | ICD-10-CM | POA: Diagnosis not present

## 2022-01-14 DIAGNOSIS — M7632 Iliotibial band syndrome, left leg: Secondary | ICD-10-CM | POA: Diagnosis not present

## 2022-01-15 ENCOUNTER — Encounter (INDEPENDENT_AMBULATORY_CARE_PROVIDER_SITE_OTHER): Payer: Self-pay

## 2022-01-15 ENCOUNTER — Ambulatory Visit (INDEPENDENT_AMBULATORY_CARE_PROVIDER_SITE_OTHER): Payer: BC Managed Care – PPO | Admitting: Family Medicine

## 2022-01-16 ENCOUNTER — Ambulatory Visit
Admission: RE | Admit: 2022-01-16 | Discharge: 2022-01-16 | Disposition: A | Discharge status: Home / Self Care | Payer: BC Managed Care – PPO | Source: Ambulatory Visit | Attending: Adult Health | Admitting: Adult Health

## 2022-01-16 DIAGNOSIS — Z170 Estrogen receptor positive status [ER+]: Secondary | ICD-10-CM

## 2022-01-16 DIAGNOSIS — Z853 Personal history of malignant neoplasm of breast: Secondary | ICD-10-CM | POA: Diagnosis not present

## 2022-01-16 DIAGNOSIS — R92323 Mammographic fibroglandular density, bilateral breasts: Secondary | ICD-10-CM | POA: Diagnosis not present

## 2022-01-21 DIAGNOSIS — F332 Major depressive disorder, recurrent severe without psychotic features: Secondary | ICD-10-CM | POA: Diagnosis not present

## 2022-01-28 ENCOUNTER — Ambulatory Visit (INDEPENDENT_AMBULATORY_CARE_PROVIDER_SITE_OTHER): Payer: BC Managed Care – PPO | Admitting: Family Medicine

## 2022-01-28 ENCOUNTER — Other Ambulatory Visit (INDEPENDENT_AMBULATORY_CARE_PROVIDER_SITE_OTHER): Payer: Self-pay | Admitting: Family Medicine

## 2022-01-28 ENCOUNTER — Encounter (INDEPENDENT_AMBULATORY_CARE_PROVIDER_SITE_OTHER): Payer: Self-pay | Admitting: Family Medicine

## 2022-01-28 VITALS — BP 112/73 | HR 61 | Temp 97.9°F | Ht 65.0 in | Wt 299.0 lb

## 2022-01-28 DIAGNOSIS — E669 Obesity, unspecified: Secondary | ICD-10-CM

## 2022-01-28 DIAGNOSIS — F3289 Other specified depressive episodes: Secondary | ICD-10-CM | POA: Diagnosis not present

## 2022-01-28 DIAGNOSIS — E559 Vitamin D deficiency, unspecified: Secondary | ICD-10-CM | POA: Diagnosis not present

## 2022-01-28 DIAGNOSIS — F332 Major depressive disorder, recurrent severe without psychotic features: Secondary | ICD-10-CM | POA: Diagnosis not present

## 2022-01-28 DIAGNOSIS — F418 Other specified anxiety disorders: Secondary | ICD-10-CM

## 2022-01-28 DIAGNOSIS — Z6841 Body Mass Index (BMI) 40.0 and over, adult: Secondary | ICD-10-CM | POA: Diagnosis not present

## 2022-01-28 DIAGNOSIS — M170 Bilateral primary osteoarthritis of knee: Secondary | ICD-10-CM | POA: Diagnosis not present

## 2022-01-28 MED ORDER — VITAMIN D (ERGOCALCIFEROL) 1.25 MG (50000 UNIT) PO CAPS: 50000.0000 [IU] | ORAL_CAPSULE | ORAL | 0 refills | Status: DC

## 2022-01-28 MED ORDER — TOPIRAMATE 50 MG PO TABS: 50.0000 mg | ORAL_TABLET | Freq: Every day | ORAL | 0 refills | Status: DC

## 2022-01-29 DIAGNOSIS — R202 Paresthesia of skin: Secondary | ICD-10-CM | POA: Diagnosis not present

## 2022-01-29 DIAGNOSIS — Z7689 Persons encountering health services in other specified circumstances: Secondary | ICD-10-CM | POA: Diagnosis not present

## 2022-01-29 DIAGNOSIS — R200 Anesthesia of skin: Secondary | ICD-10-CM | POA: Diagnosis not present

## 2022-01-29 DIAGNOSIS — M79671 Pain in right foot: Secondary | ICD-10-CM | POA: Diagnosis not present

## 2022-02-02 DIAGNOSIS — F332 Major depressive disorder, recurrent severe without psychotic features: Secondary | ICD-10-CM | POA: Diagnosis not present

## 2022-02-09 DIAGNOSIS — F332 Major depressive disorder, recurrent severe without psychotic features: Secondary | ICD-10-CM | POA: Diagnosis not present

## 2022-02-10 NOTE — Progress Notes (Signed)
Chief Complaint:   OBESITY Tracy [name] is here to discuss her progress with her obesity treatment plan along with follow-up of her obesity related diagnoses. Tracy [name] is on keeping a food journal and adhering to recommended goals of 1400-1700 calories and 100+ grams of protein and states she is following her eating plan approximately 0% of the time. Tracy [name] states she is doing 0 minutes 0 times per week.  Today's visit was #: 5 Starting weight: 305 lbs Starting date: 11/19/2021 Today's weight: 299 lbs  Today's date: 01/28/2022 Total lbs lost to date: 6 Total lbs lost since last in-office visit: 0  Interim History: Tracy [name] has been dealing with more knee pain and she has not been able to follow her plan as closely.  She still tries to be mindful and avoid fried food, and tried to portion control.  Subjective:   1. Vitamin D deficiency Tracy [name]'s last vitamin D level was low, and she has no problems on vitamin D prescription.  2. Other depression, emotional eating behavior Tracy [name] is on Topamax, and she noticed some dysgeusia with carbonated liquids.  Assessment/Plan:   1. Vitamin D deficiency We will refill prescription Vitamin D for 1 month. Tracy [name] will follow-up for routine testing of Vitamin D, at least 2-3 times per year to avoid over-replacement.  - Vitamin D, Ergocalciferol, (DRISDOL) 1.25 MG (50000 UNIT) CAPS capsule; Take 1 capsule (50,000 Units total) by mouth every 7 (seven) days.  Dispense: 4 capsule; Refill: 0  2. Other depression, emotional eating behavior Patient will continue Topamax 50 mg once daily, and we will refill for 1 month.  - topiramate (TOPAMAX) 50 MG tablet; Take 1 tablet (50 mg total) by mouth daily.  Dispense: 30 tablet; Refill: 0  3. Obesity, Current BMI 49.8 Tracy [name] is currently in the action stage of change. As such, her goal is to continue with weight loss efforts. She has agreed to keeping a food journal and adhering to recommended goals of 1400-1700 calories and 100+  grams of protein.   Behavioral modification strategies: increasing lean protein intake.  Tracy [name] has agreed to follow-up with our clinic in 2 to 3 weeks. She was informed of the importance of frequent follow-up visits to maximize her success with intensive lifestyle modifications for her multiple health conditions.   Objective:   Blood pressure 112/73, pulse 61, temperature 97.9 F (36.6 C), height '5\' 5"'$  (1.651 m), weight 299 lb (135.6 kg), last menstrual period 07/20/2008, SpO2 99 %. Body mass index is 49.76 kg/m.  General: Cooperative, alert, well developed, in no acute distress. HEENT: Conjunctivae and lids unremarkable. Cardiovascular: Regular rhythm.  Lungs: Normal work of breathing. Neurologic: No focal deficits.   Lab Results  Component Value Date   CREATININE 0.61 11/19/2021   BUN 9 11/19/2021   NA 144 11/19/2021   K 3.6 11/19/2021   CL 103 11/19/2021   CO2 25 11/19/2021   Lab Results  Component Value Date   ALT 29 11/19/2021   AST 26 11/19/2021   ALKPHOS 98 11/19/2021   BILITOT <0.2 11/19/2021   Lab Results  Component Value Date   HGBA1C 5.6 11/19/2021   Lab Results  Component Value Date   INSULIN 5.6 11/19/2021   Lab Results  Component Value Date   TSH 1.090 11/19/2021   Lab Results  Component Value Date   CHOL 193 11/19/2021   HDL 107 11/19/2021   LDLCALC 73 11/19/2021   TRIG 73 11/19/2021   Lab Results  Component Value Date  VD25OH 12.6 (L) 11/19/2021   Lab Results  Component Value Date   WBC 4.6 11/19/2021   HGB 11.0 (L) 11/19/2021   HCT 35.8 11/19/2021   MCV 77 (L) 11/19/2021   PLT 301 11/07/2021   Lab Results  Component Value Date   IRON 15 (L) 11/07/2021   TIBC 573 (H) 11/07/2021   FERRITIN 4 (L) 11/07/2021   Attestation Statements:   Reviewed by clinician on day of visit: allergies, medications, problem list, medical history, surgical history, family history, social history, and previous encounter notes.   I, Trixie Dredge,  am acting as transcriptionist for Dennard Nip, MD.  I have reviewed the above documentation for accuracy and completeness, and I agree with the above. -  Dennard Nip, MD

## 2022-02-16 DIAGNOSIS — F332 Major depressive disorder, recurrent severe without psychotic features: Secondary | ICD-10-CM | POA: Diagnosis not present

## 2022-02-18 DIAGNOSIS — F332 Major depressive disorder, recurrent severe without psychotic features: Secondary | ICD-10-CM | POA: Diagnosis not present

## 2022-02-23 ENCOUNTER — Encounter (INDEPENDENT_AMBULATORY_CARE_PROVIDER_SITE_OTHER): Payer: Self-pay | Admitting: Family Medicine

## 2022-02-23 ENCOUNTER — Other Ambulatory Visit (INDEPENDENT_AMBULATORY_CARE_PROVIDER_SITE_OTHER): Payer: Self-pay | Admitting: Family Medicine

## 2022-02-23 ENCOUNTER — Ambulatory Visit (INDEPENDENT_AMBULATORY_CARE_PROVIDER_SITE_OTHER): Payer: BC Managed Care – PPO | Admitting: Family Medicine

## 2022-02-23 VITALS — BP 149/81 | HR 71 | Temp 98.4°F | Ht 65.0 in | Wt 301.0 lb

## 2022-02-23 DIAGNOSIS — E669 Obesity, unspecified: Secondary | ICD-10-CM | POA: Diagnosis not present

## 2022-02-23 DIAGNOSIS — R739 Hyperglycemia, unspecified: Secondary | ICD-10-CM

## 2022-02-23 DIAGNOSIS — R030 Elevated blood-pressure reading, without diagnosis of hypertension: Secondary | ICD-10-CM | POA: Diagnosis not present

## 2022-02-23 DIAGNOSIS — F3289 Other specified depressive episodes: Secondary | ICD-10-CM | POA: Diagnosis not present

## 2022-02-23 DIAGNOSIS — F332 Major depressive disorder, recurrent severe without psychotic features: Secondary | ICD-10-CM | POA: Diagnosis not present

## 2022-02-23 DIAGNOSIS — Z6841 Body Mass Index (BMI) 40.0 and over, adult: Secondary | ICD-10-CM

## 2022-02-23 MED ORDER — TOPIRAMATE 50 MG PO TABS: 50.0000 mg | ORAL_TABLET | Freq: Every day | ORAL | 0 refills | Status: DC

## 2022-02-23 MED ORDER — RYBELSUS 3 MG PO TABS: 3.0000 mg | ORAL_TABLET | Freq: Every day | ORAL | 0 refills | Status: DC

## 2022-02-25 ENCOUNTER — Telehealth (INDEPENDENT_AMBULATORY_CARE_PROVIDER_SITE_OTHER): Payer: Self-pay | Admitting: Family Medicine

## 2022-02-25 NOTE — Telephone Encounter (Signed)
PA started for Rybelsus

## 2022-03-02 NOTE — Telephone Encounter (Signed)
PA for Rybelsus has been denied by [employer].

## 2022-03-03 DIAGNOSIS — F332 Major depressive disorder, recurrent severe without psychotic features: Secondary | ICD-10-CM | POA: Diagnosis not present

## 2022-03-11 NOTE — Progress Notes (Signed)
Chief Complaint:   OBESITY Tracy [name] is here to discuss her progress with her obesity treatment plan along with follow-up of her obesity related diagnoses. Tracy [name] is on keeping a food journal and adhering to recommended goals of 1400-1700 calories and 100+ grams of protein and states she is following her eating plan approximately (unknown)% of the time. Tracy [name] states she is doing 0 minutes 0 times per week.  Today's visit was #: 6 Starting weight: 305 lbs Starting date: 11/19/2021 Today's weight: 301 lbs Today's date: 02/23/2022 Total lbs lost to date: 4 Total lbs lost since last in-office visit: 0  Interim History: Tracy [name] notes her hunger has been a big problem, worse in the last 2 weeks.  She did very well with portion control over Thanksgiving without feeling deprived.  Subjective:   1. Hyperglycemia Tracy [name] has hyperglycemia with significant polyphagia.  She is working on her diet but she is struggling a lot.  No improvement with metformin.  2. Elevated blood pressure reading Tracy [name]'s blood pressure is elevated, and she is upset about her weight which may contribute to her elevated blood pressure.  3. Other depression, emotional eating behavior Tracy [name] is working on [employer] behaviors, and she is tolerating Topamax well.  Assessment/Plan:   1. Hyperglycemia Tracy [name] agreed to start Rybelsus 3 mg every morning with no refills.  We will follow-up at her next visit.  - Semaglutide (RYBELSUS) 3 MG TABS; Take 3 mg by mouth daily.  Dispense: 30 tablet; Refill: 0  2. Elevated blood pressure reading Tracy [name] will work on increasing her water intake, and she will work on her diet.  We will recheck her blood pressure and 1 month, may need to look at adjusting medications if her blood pressure remains elevated.  3. Other depression, emotional eating behavior Tracy [name] will continue Topamax 50 mg once daily, and we will refill for 1 month.  - topiramate (TOPAMAX) 50 MG tablet; Take 1 tablet  (50 mg total) by mouth daily.  Dispense: 30 tablet; Refill: 0  4. Obesity, Current BMI 50.2 Tracy [name] is currently in the action stage of change. As such, her goal is to continue with weight loss efforts. She has agreed to keeping a food journal and adhering to recommended goals of 1400-1700 calories and 100+ grams of protein daily.   Behavioral modification strategies: increasing lean protein intake and meal planning and cooking strategies.  Tracy [name] has agreed to follow-up with our clinic in 4 weeks. She was informed of the importance of frequent follow-up visits to maximize her success with intensive lifestyle modifications for her multiple health conditions.   Objective:   Blood pressure (!) 149/81, pulse 71, temperature 98.4 F (36.9 C), height '5\' 5"'$  (1.651 m), weight (!) 301 lb (136.5 kg), last menstrual period 07/20/2008, SpO2 96 %. Body mass index is 50.09 kg/m.  General: Cooperative, alert, well developed, in no acute distress. HEENT: Conjunctivae and lids unremarkable. Cardiovascular: Regular rhythm.  Lungs: Normal work of breathing. Neurologic: No focal deficits.   Lab Results  Component Value Date   CREATININE 0.61 11/19/2021   BUN 9 11/19/2021   NA 144 11/19/2021   K 3.6 11/19/2021   CL 103 11/19/2021   CO2 25 11/19/2021   Lab Results  Component Value Date   ALT 29 11/19/2021   AST 26 11/19/2021   ALKPHOS 98 11/19/2021   BILITOT <0.2 11/19/2021   Lab Results  Component Value Date   HGBA1C 5.6 11/19/2021   Lab Results  Component Value  Date   INSULIN 5.6 11/19/2021   Lab Results  Component Value Date   TSH 1.090 11/19/2021   Lab Results  Component Value Date   CHOL 193 11/19/2021   HDL 107 11/19/2021   LDLCALC 73 11/19/2021   TRIG 73 11/19/2021   Lab Results  Component Value Date   VD25OH 12.6 (L) 11/19/2021   Lab Results  Component Value Date   WBC 4.6 11/19/2021   HGB 11.0 (L) 11/19/2021   HCT 35.8 11/19/2021   MCV 77 (L) 11/19/2021   PLT 301  11/07/2021   Lab Results  Component Value Date   IRON 15 (L) 11/07/2021   TIBC 573 (H) 11/07/2021   FERRITIN 4 (L) 11/07/2021   Attestation Statements:   Reviewed by clinician on day of visit: allergies, medications, problem list, medical history, surgical history, family history, social history, and previous encounter notes.   I, Trixie Dredge, am acting as transcriptionist for Dennard Nip, MD.  I have reviewed the above documentation for accuracy and completeness, and I agree with the above. -  Dennard Nip, MD

## 2022-03-18 DIAGNOSIS — F332 Major depressive disorder, recurrent severe without psychotic features: Secondary | ICD-10-CM | POA: Diagnosis not present

## 2022-03-23 ENCOUNTER — Other Ambulatory Visit: Payer: Self-pay | Admitting: Pulmonary Disease

## 2022-03-24 DIAGNOSIS — R202 Paresthesia of skin: Secondary | ICD-10-CM | POA: Diagnosis not present

## 2022-03-24 DIAGNOSIS — F333 Major depressive disorder, recurrent, severe with psychotic symptoms: Secondary | ICD-10-CM | POA: Diagnosis not present

## 2022-03-24 DIAGNOSIS — R200 Anesthesia of skin: Secondary | ICD-10-CM | POA: Diagnosis not present

## 2022-03-25 ENCOUNTER — Telehealth: Payer: Self-pay | Admitting: Pulmonary Disease

## 2022-03-27 ENCOUNTER — Other Ambulatory Visit: Payer: Self-pay | Admitting: Pulmonary Disease

## 2022-03-27 DIAGNOSIS — Z9884 Bariatric surgery status: Secondary | ICD-10-CM | POA: Diagnosis not present

## 2022-03-27 DIAGNOSIS — Z713 Dietary counseling and surveillance: Secondary | ICD-10-CM | POA: Diagnosis not present

## 2022-03-27 DIAGNOSIS — K912 Postsurgical malabsorption, not elsewhere classified: Secondary | ICD-10-CM | POA: Diagnosis not present

## 2022-03-27 DIAGNOSIS — D508 Other iron deficiency anemias: Secondary | ICD-10-CM | POA: Diagnosis not present

## 2022-03-27 DIAGNOSIS — Z6841 Body Mass Index (BMI) 40.0 and over, adult: Secondary | ICD-10-CM | POA: Diagnosis not present

## 2022-03-27 MED ORDER — ZOLPIDEM TARTRATE ER 12.5 MG PO TBCR: 12.5000 mg | EXTENDED_RELEASE_TABLET | Freq: Every evening | ORAL | 2 refills | Status: DC | PRN

## 2022-03-27 NOTE — Telephone Encounter (Signed)
Ambien refilled

## 2022-03-27 NOTE — Telephone Encounter (Signed)
Called patient but she did not answer. Left message for her to call us back.  

## 2022-03-31 DIAGNOSIS — F332 Major depressive disorder, recurrent severe without psychotic features: Secondary | ICD-10-CM | POA: Diagnosis not present

## 2022-04-01 ENCOUNTER — Other Ambulatory Visit (INDEPENDENT_AMBULATORY_CARE_PROVIDER_SITE_OTHER): Payer: Self-pay | Admitting: Family Medicine

## 2022-04-01 ENCOUNTER — Encounter (INDEPENDENT_AMBULATORY_CARE_PROVIDER_SITE_OTHER): Payer: Self-pay | Admitting: Family Medicine

## 2022-04-01 ENCOUNTER — Ambulatory Visit (INDEPENDENT_AMBULATORY_CARE_PROVIDER_SITE_OTHER): Payer: BC Managed Care – PPO | Admitting: Family Medicine

## 2022-04-01 VITALS — BP 130/79 | HR 82 | Temp 98.0°F | Ht 65.0 in | Wt 297.0 lb

## 2022-04-01 DIAGNOSIS — E559 Vitamin D deficiency, unspecified: Secondary | ICD-10-CM

## 2022-04-01 DIAGNOSIS — Z6841 Body Mass Index (BMI) 40.0 and over, adult: Secondary | ICD-10-CM

## 2022-04-01 DIAGNOSIS — E669 Obesity, unspecified: Secondary | ICD-10-CM

## 2022-04-01 DIAGNOSIS — F3289 Other specified depressive episodes: Secondary | ICD-10-CM

## 2022-04-01 DIAGNOSIS — R632 Polyphagia: Secondary | ICD-10-CM

## 2022-04-01 MED ORDER — TOPIRAMATE 50 MG PO TABS: 50.0000 mg | ORAL_TABLET | Freq: Every day | ORAL | 0 refills | Status: DC

## 2022-04-01 MED ORDER — VITAMIN D (ERGOCALCIFEROL) 1.25 MG (50000 UNIT) PO CAPS: 50000.0000 [IU] | ORAL_CAPSULE | ORAL | 0 refills | Status: DC

## 2022-04-02 ENCOUNTER — Telehealth (INDEPENDENT_AMBULATORY_CARE_PROVIDER_SITE_OTHER): Payer: Self-pay | Admitting: Family Medicine

## 2022-04-02 DIAGNOSIS — Z6841 Body Mass Index (BMI) 40.0 and over, adult: Secondary | ICD-10-CM | POA: Diagnosis not present

## 2022-04-02 DIAGNOSIS — R1031 Right lower quadrant pain: Secondary | ICD-10-CM | POA: Diagnosis not present

## 2022-04-02 DIAGNOSIS — R300 Dysuria: Secondary | ICD-10-CM | POA: Diagnosis not present

## 2022-04-02 NOTE — Telephone Encounter (Signed)
Pt called 04/02/22 came in to see Dr Leafy Ro 04/01/22 they talked about her taking topiramate  when she went to the pharmacy to pick up medication it was metformin.She would like to know which one she should be taking.

## 2022-04-06 DIAGNOSIS — F32A Depression, unspecified: Secondary | ICD-10-CM | POA: Diagnosis not present

## 2022-04-06 DIAGNOSIS — S12591A Other nondisplaced fracture of sixth cervical vertebra, initial encounter for closed fracture: Secondary | ICD-10-CM | POA: Diagnosis not present

## 2022-04-06 DIAGNOSIS — Z886 Allergy status to analgesic agent status: Secondary | ICD-10-CM | POA: Diagnosis not present

## 2022-04-06 DIAGNOSIS — S0512XA Contusion of eyeball and orbital tissues, left eye, initial encounter: Secondary | ICD-10-CM | POA: Diagnosis not present

## 2022-04-06 DIAGNOSIS — F419 Anxiety disorder, unspecified: Secondary | ICD-10-CM | POA: Diagnosis not present

## 2022-04-06 DIAGNOSIS — R4020 Unspecified coma: Secondary | ICD-10-CM | POA: Diagnosis not present

## 2022-04-06 DIAGNOSIS — Z79899 Other long term (current) drug therapy: Secondary | ICD-10-CM | POA: Diagnosis not present

## 2022-04-06 DIAGNOSIS — S4991XA Unspecified injury of right shoulder and upper arm, initial encounter: Secondary | ICD-10-CM | POA: Diagnosis not present

## 2022-04-06 DIAGNOSIS — J45909 Unspecified asthma, uncomplicated: Secondary | ICD-10-CM | POA: Diagnosis not present

## 2022-04-06 DIAGNOSIS — S12600A Unspecified displaced fracture of seventh cervical vertebra, initial encounter for closed fracture: Secondary | ICD-10-CM | POA: Diagnosis not present

## 2022-04-06 DIAGNOSIS — Z7289 Other problems related to lifestyle: Secondary | ICD-10-CM | POA: Diagnosis not present

## 2022-04-06 DIAGNOSIS — D509 Iron deficiency anemia, unspecified: Secondary | ICD-10-CM | POA: Diagnosis not present

## 2022-04-06 DIAGNOSIS — S12500A Unspecified displaced fracture of sixth cervical vertebra, initial encounter for closed fracture: Secondary | ICD-10-CM | POA: Diagnosis not present

## 2022-04-06 DIAGNOSIS — S0993XA Unspecified injury of face, initial encounter: Secondary | ICD-10-CM | POA: Diagnosis not present

## 2022-04-06 DIAGNOSIS — S0511XA Contusion of eyeball and orbital tissues, right eye, initial encounter: Secondary | ICD-10-CM | POA: Diagnosis not present

## 2022-04-06 DIAGNOSIS — S0510XA Contusion of eyeball and orbital tissues, unspecified eye, initial encounter: Secondary | ICD-10-CM | POA: Diagnosis not present

## 2022-04-06 DIAGNOSIS — Z853 Personal history of malignant neoplasm of breast: Secondary | ICD-10-CM | POA: Diagnosis not present

## 2022-04-06 DIAGNOSIS — K589 Irritable bowel syndrome without diarrhea: Secondary | ICD-10-CM | POA: Diagnosis not present

## 2022-04-06 DIAGNOSIS — I10 Essential (primary) hypertension: Secondary | ICD-10-CM | POA: Diagnosis not present

## 2022-04-06 DIAGNOSIS — S12691A Other nondisplaced fracture of seventh cervical vertebra, initial encounter for closed fracture: Secondary | ICD-10-CM | POA: Diagnosis not present

## 2022-04-06 DIAGNOSIS — M25511 Pain in right shoulder: Secondary | ICD-10-CM | POA: Diagnosis not present

## 2022-04-06 DIAGNOSIS — S12501A Unspecified nondisplaced fracture of sixth cervical vertebra, initial encounter for closed fracture: Secondary | ICD-10-CM | POA: Diagnosis not present

## 2022-04-06 DIAGNOSIS — W19XXXA Unspecified fall, initial encounter: Secondary | ICD-10-CM | POA: Diagnosis not present

## 2022-04-06 DIAGNOSIS — S12601A Unspecified nondisplaced fracture of seventh cervical vertebra, initial encounter for closed fracture: Secondary | ICD-10-CM | POA: Diagnosis not present

## 2022-04-06 DIAGNOSIS — S0990XA Unspecified injury of head, initial encounter: Secondary | ICD-10-CM | POA: Diagnosis not present

## 2022-04-07 NOTE — Progress Notes (Unsigned)
Chief Complaint:   OBESITY Tracy [name] is here to discuss her progress with her obesity treatment plan along with follow-up of her obesity related diagnoses. Tracy [name] is on {MWMwtlossportion/plan2:23431} and states she is following her eating plan approximately ***% of the time. Tracy [name] states she is *** *** minutes *** times per week.  Today's visit was #: *** Starting weight: *** Starting date: *** Today's weight: *** Today's date: 04/01/2022 Total lbs lost to date: *** Total lbs lost since last in-office visit: ***  Interim History: ***  Subjective:   1. Polyphagia ***  2. Vitamin D deficiency ***  3. Emotional Eating Behavior ***  Assessment/Plan:   1. Polyphagia ***  2. Vitamin D deficiency *** - Vitamin D, Ergocalciferol, (DRISDOL) 1.25 MG (50000 UNIT) CAPS capsule; Take 1 capsule (50,000 Units total) by mouth every 7 (seven) days.  Dispense: 4 capsule; Refill: 0  3. Emotional Eating Behavior *** - topiramate (TOPAMAX) 50 MG tablet; Take 1 tablet (50 mg total) by mouth daily.  Dispense: 30 tablet; Refill: 0  4. Obesity, Current BMI 49.6 Tracy [name] is currently in the action stage of change. As such, her goal is to continue with weight loss efforts. She has agreed to keeping a food journal and adhering to recommended goals of 1400-1700 calories and 100+ grams of protein daily.   Exercise goals: As is.   Behavioral modification strategies: increasing lean protein intake and keeping a strict food journal.  Tracy [name] has agreed to follow-up with our clinic in 4 weeks. She was informed of the importance of frequent follow-up visits to maximize her success with intensive lifestyle modifications for her multiple health conditions.   Objective:   Blood pressure 130/79, pulse 82, temperature 98 F (36.7 C), height '5\' 5"'$  (1.651 m), weight 297 lb (134.7 kg), last menstrual period 07/20/2008, SpO2 95 %. Body mass index is 49.42 kg/m.  General: Cooperative, alert, well developed, in no  acute distress. HEENT: Conjunctivae and lids unremarkable. Cardiovascular: Regular rhythm.  Lungs: Normal work of breathing. Neurologic: No focal deficits.   Lab Results  Component Value Date   CREATININE 0.61 11/19/2021   BUN 9 11/19/2021   NA 144 11/19/2021   K 3.6 11/19/2021   CL 103 11/19/2021   CO2 25 11/19/2021   Lab Results  Component Value Date   ALT 29 11/19/2021   AST 26 11/19/2021   ALKPHOS 98 11/19/2021   BILITOT <0.2 11/19/2021   Lab Results  Component Value Date   HGBA1C 5.6 11/19/2021   Lab Results  Component Value Date   INSULIN 5.6 11/19/2021   Lab Results  Component Value Date   TSH 1.090 11/19/2021   Lab Results  Component Value Date   CHOL 193 11/19/2021   HDL 107 11/19/2021   LDLCALC 73 11/19/2021   TRIG 73 11/19/2021   Lab Results  Component Value Date   VD25OH 12.6 (L) 11/19/2021   Lab Results  Component Value Date   WBC 4.6 11/19/2021   HGB 11.0 (L) 11/19/2021   HCT 35.8 11/19/2021   MCV 77 (L) 11/19/2021   PLT 301 11/07/2021   Lab Results  Component Value Date   IRON 15 (L) 11/07/2021   TIBC 573 (H) 11/07/2021   FERRITIN 4 (L) 11/07/2021   Attestation Statements:   Reviewed by clinician on day of visit: allergies, medications, problem list, medical history, surgical history, family history, social history, and previous encounter notes.   Wilhemena Durie, am acting as Location manager for [employer],  MD.  I have reviewed the above documentation for accuracy and completeness, and I agree with the above. -  ***

## 2022-04-08 ENCOUNTER — Other Ambulatory Visit (INDEPENDENT_AMBULATORY_CARE_PROVIDER_SITE_OTHER): Payer: Self-pay | Admitting: Family Medicine

## 2022-04-08 DIAGNOSIS — R632 Polyphagia: Secondary | ICD-10-CM

## 2022-04-08 DIAGNOSIS — F332 Major depressive disorder, recurrent severe without psychotic features: Secondary | ICD-10-CM | POA: Diagnosis not present

## 2022-04-08 MED ORDER — METFORMIN HCL 500 MG PO TABS: 500.0000 mg | ORAL_TABLET | Freq: Every morning | ORAL | 0 refills | Status: DC

## 2022-04-14 DIAGNOSIS — F1090 Alcohol use, unspecified, uncomplicated: Secondary | ICD-10-CM | POA: Diagnosis not present

## 2022-04-14 DIAGNOSIS — R052 Subacute cough: Secondary | ICD-10-CM | POA: Diagnosis not present

## 2022-04-14 DIAGNOSIS — F333 Major depressive disorder, recurrent, severe with psychotic symptoms: Secondary | ICD-10-CM | POA: Diagnosis not present

## 2022-04-14 DIAGNOSIS — W1839XD Other fall on same level, subsequent encounter: Secondary | ICD-10-CM | POA: Diagnosis not present

## 2022-04-14 DIAGNOSIS — S12591D Other nondisplaced fracture of sixth cervical vertebra, subsequent encounter for fracture with routine healing: Secondary | ICD-10-CM | POA: Diagnosis not present

## 2022-04-21 DIAGNOSIS — F411 Generalized anxiety disorder: Secondary | ICD-10-CM | POA: Diagnosis not present

## 2022-04-21 DIAGNOSIS — F332 Major depressive disorder, recurrent severe without psychotic features: Secondary | ICD-10-CM | POA: Diagnosis not present

## 2022-04-22 ENCOUNTER — Ambulatory Visit (INDEPENDENT_AMBULATORY_CARE_PROVIDER_SITE_OTHER): Payer: BC Managed Care – PPO | Admitting: Family Medicine

## 2022-04-23 DIAGNOSIS — Z9181 History of falling: Secondary | ICD-10-CM | POA: Diagnosis not present

## 2022-04-23 DIAGNOSIS — M79671 Pain in right foot: Secondary | ICD-10-CM | POA: Diagnosis not present

## 2022-04-23 DIAGNOSIS — R200 Anesthesia of skin: Secondary | ICD-10-CM | POA: Diagnosis not present

## 2022-04-23 DIAGNOSIS — S0990XS Unspecified injury of head, sequela: Secondary | ICD-10-CM | POA: Diagnosis not present

## 2022-04-28 DIAGNOSIS — F332 Major depressive disorder, recurrent severe without psychotic features: Secondary | ICD-10-CM | POA: Diagnosis not present

## 2022-04-29 DIAGNOSIS — M4312 Spondylolisthesis, cervical region: Secondary | ICD-10-CM | POA: Diagnosis not present

## 2022-04-29 DIAGNOSIS — K5903 Drug induced constipation: Secondary | ICD-10-CM | POA: Diagnosis not present

## 2022-04-29 DIAGNOSIS — S12600A Unspecified displaced fracture of seventh cervical vertebra, initial encounter for closed fracture: Secondary | ICD-10-CM | POA: Diagnosis not present

## 2022-04-29 DIAGNOSIS — Z79899 Other long term (current) drug therapy: Secondary | ICD-10-CM | POA: Diagnosis not present

## 2022-04-29 DIAGNOSIS — F32A Depression, unspecified: Secondary | ICD-10-CM | POA: Diagnosis not present

## 2022-04-29 DIAGNOSIS — J45909 Unspecified asthma, uncomplicated: Secondary | ICD-10-CM | POA: Diagnosis not present

## 2022-04-29 DIAGNOSIS — T40605D Adverse effect of unspecified narcotics, subsequent encounter: Secondary | ICD-10-CM | POA: Diagnosis not present

## 2022-04-29 DIAGNOSIS — S12501D Unspecified nondisplaced fracture of sixth cervical vertebra, subsequent encounter for fracture with routine healing: Secondary | ICD-10-CM | POA: Insufficient documentation

## 2022-04-29 DIAGNOSIS — F419 Anxiety disorder, unspecified: Secondary | ICD-10-CM | POA: Diagnosis not present

## 2022-04-29 DIAGNOSIS — S12591D Other nondisplaced fracture of sixth cervical vertebra, subsequent encounter for fracture with routine healing: Secondary | ICD-10-CM | POA: Diagnosis not present

## 2022-04-29 DIAGNOSIS — W19XXXD Unspecified fall, subsequent encounter: Secondary | ICD-10-CM | POA: Diagnosis not present

## 2022-04-29 DIAGNOSIS — S12501A Unspecified nondisplaced fracture of sixth cervical vertebra, initial encounter for closed fracture: Secondary | ICD-10-CM | POA: Diagnosis not present

## 2022-04-29 DIAGNOSIS — K589 Irritable bowel syndrome without diarrhea: Secondary | ICD-10-CM | POA: Diagnosis not present

## 2022-05-04 ENCOUNTER — Inpatient Hospital Stay: Payer: BC Managed Care – PPO | Admitting: Hematology

## 2022-05-04 ENCOUNTER — Inpatient Hospital Stay: Payer: BC Managed Care – PPO | Attending: Adult Health | Admitting: Adult Health

## 2022-05-04 VITALS — BP 139/81 | HR 72 | Temp 98.0°F | Resp 16 | Ht 65.0 in | Wt 313.8 lb

## 2022-05-04 DIAGNOSIS — E559 Vitamin D deficiency, unspecified: Secondary | ICD-10-CM | POA: Insufficient documentation

## 2022-05-04 DIAGNOSIS — C50212 Malignant neoplasm of upper-inner quadrant of left female breast: Secondary | ICD-10-CM | POA: Insufficient documentation

## 2022-05-04 DIAGNOSIS — Z9079 Acquired absence of other genital organ(s): Secondary | ICD-10-CM | POA: Insufficient documentation

## 2022-05-04 DIAGNOSIS — Z90710 Acquired absence of both cervix and uterus: Secondary | ICD-10-CM | POA: Insufficient documentation

## 2022-05-04 DIAGNOSIS — E538 Deficiency of other specified B group vitamins: Secondary | ICD-10-CM | POA: Diagnosis not present

## 2022-05-04 DIAGNOSIS — Z9884 Bariatric surgery status: Secondary | ICD-10-CM | POA: Diagnosis not present

## 2022-05-04 DIAGNOSIS — Z170 Estrogen receptor positive status [ER+]: Secondary | ICD-10-CM | POA: Diagnosis not present

## 2022-05-04 DIAGNOSIS — Z79810 Long term (current) use of selective estrogen receptor modulators (SERMs): Secondary | ICD-10-CM | POA: Diagnosis not present

## 2022-05-04 DIAGNOSIS — D508 Other iron deficiency anemias: Secondary | ICD-10-CM | POA: Insufficient documentation

## 2022-05-04 DIAGNOSIS — D649 Anemia, unspecified: Secondary | ICD-10-CM

## 2022-05-04 DIAGNOSIS — Z923 Personal history of irradiation: Secondary | ICD-10-CM | POA: Insufficient documentation

## 2022-05-04 LAB — IRON AND IRON BINDING CAPACITY (CC-WL,HP ONLY)
Iron: 108 ug/dL (ref 28–170)
Saturation Ratios: 22 % (ref 10.4–31.8)
TIBC: 482 ug/dL — ABNORMAL HIGH (ref 250–450)
UIBC: 374 ug/dL (ref 148–442)

## 2022-05-04 LAB — FERRITIN: Ferritin: 23 ng/mL (ref 11–307)

## 2022-05-04 MED ORDER — TAMOXIFEN CITRATE 20 MG PO TABS: ORAL_TABLET | ORAL | 3 refills | Status: DC

## 2022-05-04 NOTE — Assessment & Plan Note (Signed)
Tracy [name] is a 57 year old woman with stage Ia ER/PR positive breast cancer diagnosed in November 2022 status postlumpectomy followed by adjuvant radiation and antiestrogen therapy with tamoxifen daily beginning in March 2023.  Tracy [name] has no clinical or radiographic signs of breast cancer recurrence.  I recommended that she continue to follow-up with annual mammograms next due in November 2024.  She will continue on tamoxifen daily.  Recommended healthy diet and exercise and that she could to do close follow-up with her primary care provider.

## 2022-05-04 NOTE — Progress Notes (Signed)
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 [facility]) Staging form: Breast, AJCC 8th Edition - Clinical stage from 02/12/2021: Stage IA (cT1b, cN0, cM0, G2, ER+, PR+, HER2-) - Signed by Tracy Gibson, MD on 02/12/2021 Stage prefix: Initial diagnosis Histologic grading system: 3 grade system - Pathologic stage from 02/25/2021: Stage IA (pT1b, pN0, cM0, G2, ER+, PR+, HER2-, Oncotype DX score: 3) - Signed by Tracy Merle, MD on 05/03/2021 Stage prefix: Initial diagnosis Multigene prognostic tests performed: Oncotype DX Recurrence score range: Less than 11 Histologic grading system: 3 grade system Residual tumor (R): R0 - None   SUMMARY OF ONCOLOGIC HISTORY: Oncology History Overview Note   Cancer Staging  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 ([place]) Staging form: Breast, AJCC 8th Edition - Clinical stage from 02/12/2021: Stage IA (cT1b, cN0, cM0, G2, ER+, PR+, HER2-) - Signed by Tracy Gibson, MD on 02/12/2021 Stage prefix: Initial diagnosis Histologic grading system: 3 grade system - Pathologic stage from 02/25/2021: Stage IA (pT1b, pN0, cM0, G2, ER+, PR+, HER2-, Oncotype DX score: 3) - Signed by Tracy Merle, MD on 05/03/2021 Stage prefix: Initial diagnosis Multigene prognostic tests performed: Oncotype DX Recurrence score range: Less than 11 Histologic grading system: 3 grade system Residual tumor (R): R0 - None     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 Suspicious left breast mass corresponding with the screening mammographic findings. 2. No suspicious left axillary lymphadenopathy.   RECOMMENDATION: Ultrasound-guided biopsy of the left breast.   01/23/2021 Procedure   ULTRASOUND GUIDED LEFT BREAST CORE NEEDLE BIOPSY   IMPRESSION: Ultrasound guided  biopsy of a mass in the left breast at 11 o'clock. No apparent complications.   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 Based on the biopsy, the carcinoma appears Nottingham grade 2 of 3 and measures 0.4 cm in greatest linear extent.  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 BREAST, LEFT ADDITIONAL ANTERIOSUPERIOR MARGIN, EXCISION:  - Benign breast parenchyma, negative for carcinoma   C. BREAST, LEFT ADDITIONAL MEDIOPOSTERIOR MARGIN, EXCISION:  - Benign breast parenchyma, negative for carcinoma   D. BREAST, LEFT ADDITIONAL INFERIOLATERAL MARGIN, EXCISION:  - Benign breast parenchyma, negative for carcinoma   E. LYMPH NODE, LEFT AXILLARY,  SENTINEL, EXCISION:  - Lymph node, negative for carcinoma (0/1)   F. LYMPH NODE,  LEFT AXILLARY, SENTINEL, EXCISION:  - Lymph node, negative for carcinoma (0/1)   G. LYMPH NODE, LEFT AXILLARY, SENTINEL, EXCISION:  - Lymph node, negative for carcinoma (0/1)    02/25/2021 Oncotype testing   Recurrence score of 4, predicts a risk of recurrence outside the breast over the next 9 years of 3%    02/25/2021 Cancer Staging   Staging form: Breast, AJCC 8th Edition - Pathologic stage from 02/25/2021: Stage IA (pT1b, pN0, cM0, G2, ER+, PR+, HER2-, Oncotype DX score: 3) - Signed by Tracy Merle, MD on 05/03/2021 Stage prefix: Initial diagnosis Multigene prognostic tests performed: Oncotype DX Recurrence score range: Less than 11 Histologic grading system: 3 grade system Residual tumor (R): R0 - None   04/03/2021 - 05/05/2021 Radiation Therapy   Site Technique Total Dose (Gy) Dose per Fx (Gy) Completed Fx Beam Energies  Breast, Left: Breast_L 3D 40.05/40.05 2.67 15/15 10X, 15X  Breast, Left: Breast_L_Bst 3D 10/10 2 5/5 6X, 10X     05/2021 -  Anti-estrogen oral therapy   Tamoxifen daily     CURRENT THERAPY:  INTERVAL HISTORY: Tracy [name] 57 y.o. female returns for follow-up of her estrogen positive breast cancer on tamoxifen therapy.  She tolerates it moderately well.  Her most recent mammogram occurred on January 16, 2022 demonstrating no mammographic evidence of malignancy and breast density category B.  Tracy [name] was seen in the emergency department at [facility] on April 06, 2022 while intoxicated after drinking 1/5 of alcohol on Saturday evening with a positive head strike.  She was found lying in the bathroom floor by her husband and was taken to the emergency department where imaging demonstrated nondisplaced fracture of C6 and C7.  She was placed in an Aspen collar with close follow-up with neurosurgery.  Tracy [name] has been seeing healthy weight and wellness and was very proud of herself because she had lost weight down to 297 however she has not been able to exercise over the past 4  weeks and this has led to somewhat of a weight gain.  She is tearful and discouraged about this today.  She denies any breast changes or concerns.    Patient Active Problem List   Diagnosis Date Noted   Closed nondisplaced fracture of sixth cervical vertebra with routine healing 04/29/2022   Polyphagia 04/01/2022   Hyperglycemia 02/23/2022   Elevated blood pressure reading 02/23/2022   Depression with anxiety 12/31/2021   Insulin resistance 12/03/2021   Other constipation 12/03/2021   Other fatigue 11/19/2021   SOBOE (shortness of breath on exertion) 11/19/2021   Depression screening 11/19/2021   Hypertension 11/19/2021   Depression 11/19/2021   Vitamin D deficiency 11/19/2021   Prediabetes 11/14/2021   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   Lactose intolerance 11/14/2021   Class 3 severe obesity with serious comorbidity and body mass index (BMI) of 50.0 to 59.9 in adult [facility]) 11/14/2021   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  MEDICAL HISTORY: Past Medical History:  Diagnosis Date   ADHD  Alcohol abuse    Anemia    Anxiety    Anxiety and depression    Arthritis    left knee   Asthma    hardly uses inhaler   B12 deficiency    Cancer ([place])    Constipation    Depression    Diverticulosis    Fatty liver 2012   pt states she was told this once   Headache    Migraines   Heart murmur    pt was told this, no echo   History of stomach ulcers    Hypertension    history no longer requires medication management   IBS (irritable bowel  syndrome)    IDA (iron deficiency anemia)    Infertility, female    Joint pain    Lactose intolerance    Melasma    Palpitations    Perforated ulcer ([place])    Pre-diabetes    PUD (peptic ulcer disease)    Sleep apnea    mild, no CPAP ordered   SOBOE (shortness of breath on exertion)    Vitamin D deficiency     SURGICAL HISTORY: Past Surgical History:  Procedure Laterality Date   ABDOMINAL HYSTERECTOMY     ANKLE RECONSTRUCTION Left 09/09/2018   Procedure: BROSTRUM-GOULD LEFT;  Surgeon: Samara Deist, DPM;  Location: [facility];  Service: Podiatry;  Laterality: Left;   BREAST LUMPECTOMY WITH RADIOACTIVE SEED AND SENTINEL LYMPH NODE BIOPSY Left 02/25/2021   Procedure: LEFT BREAST LUMPECTOMY WITH RADIOACTIVE SEED AND SENTINEL LYMPH NODE BIOPSY;  Surgeon: Erroll Luna, MD;  Location: [place];  Service: General;  Laterality: Left;   CHOLECYSTECTOMY     COLONOSCOPY     CYSTOSCOPY N/A 08/05/2016   Procedure: CYSTOSCOPY;  Surgeon: Bobbye Charleston, MD;  Location: [place] [facility];  Service: Gynecology;  Laterality: N/A;   ENDOMETRIAL ABLATION W/ NOVASURE     GASTRIC BYPASS OPEN     LYSIS OF ADHESION N/A 08/05/2016   Procedure: LYSIS OF ADHESION;  Surgeon: Bobbye Charleston, MD;  Location: [place] [facility];  Service: Gynecology;  Laterality: N/A;   perforated ulcer surgery     ROBOTIC ASSISTED SALPINGO OOPHERECTOMY Right 05/12/2017   Procedure: XI ROBOTIC ASSISTED RIGHT OOPHORECTOMY, Fulgeration of Endometriosis, Peritoneal Biopsy;  Surgeon: Bobbye Charleston, MD;  Location: [facility];  Service: Gynecology;  Laterality: Right;   ROBOTIC ASSISTED TOTAL HYSTERECTOMY WITH SALPINGECTOMY Bilateral 08/05/2016   Procedure: ROBOTIC ASSISTED TOTAL HYSTERECTOMY WITH SALPINGECTOMY;  Surgeon: Bobbye Charleston, MD;  Location: [place] [facility];  Service: Gynecology;  Laterality: Bilateral;   TENDON REPAIR Left 09/09/2018   Procedure: TENOLYSIS, MULTIPLE;  Surgeon: Samara Deist, DPM;  Location: [facility];  Service: Podiatry;   Laterality: Left;   TONSILLECTOMY     UPPER GI ENDOSCOPY      SOCIAL HISTORY: Social History   Socioeconomic History   Marital status: Married    Spouse name: Not on file   Number of children: Not on file   Years of education: Not on file   Highest education level: Not on file  Occupational History   Not on file  Tobacco Use   Smoking status: Never   Smokeless tobacco: Never  Vaping Use   Vaping Use: Never used  Substance and Sexual Activity   Alcohol use: Yes    Alcohol/week: 4.0 standard drinks of alcohol    Types: 4 Glasses of wine per week   Drug use: No   Sexual activity: Never  Other Topics Concern   Not on file  Social History Narrative  Not on file   Social Determinants of Health   Financial Resource Strain: Not on file  Food Insecurity: Not on file  Transportation Needs: Not on file  Physical Activity: Not on file  Stress: Not on file  Social Connections: Not on file  Intimate Partner Violence: Not on file    FAMILY HISTORY: Family History  Problem Relation Age of Onset   Diabetes Mother    COPD Mother    Anxiety disorder Mother    Depression Mother    Diabetes type II Mother    Obesity Mother    Crohn's disease Mother    Bipolar disorder Mother    Coronary artery disease Father    Diabetes type II Father    Hypertension Father    Pancreatic cancer Sister 45       died   Obesity Sister    Breast cancer Maternal Aunt 66   Depression Maternal Grandmother    Obesity Maternal Grandmother    Cancer Maternal Grandmother    Coronary artery disease Maternal Grandmother    Glaucoma Paternal Grandmother    Hypertension Paternal Grandmother    Osteoarthritis Paternal Grandmother    Skin cancer Paternal Grandmother    Coronary artery disease Paternal Grandfather    Leukemia Paternal Grandfather     Review of Systems  Constitutional:  Positive for fatigue. Negative for appetite change, chills, fever and unexpected weight change.  HENT:    Negative for hearing loss, lump/mass and trouble swallowing.   Eyes:  Negative for eye problems and icterus.  Respiratory:  Negative for chest tightness, cough and shortness of breath.   Cardiovascular:  Negative for chest pain, leg swelling and palpitations.  Gastrointestinal:  Negative for abdominal distention, abdominal pain, constipation, diarrhea, nausea and vomiting.  Endocrine: Negative for hot flashes.  Genitourinary:  Negative for difficulty urinating.   Musculoskeletal:  Positive for neck pain (Due to recent cervical fracture). Negative for arthralgias.  Skin:  Negative for itching and rash.  Neurological:  Negative for dizziness, extremity weakness, headaches and numbness.  Hematological:  Negative for adenopathy. Does not bruise/bleed easily.  Psychiatric/Behavioral:  Negative for depression. The patient is not nervous/anxious.       PHYSICAL EXAMINATION  ECOG PERFORMANCE STATUS: 1 - Symptomatic but completely ambulatory  Vitals:   05/04/22 1411  BP: 139/81  Pulse: 72  Resp: 16  Temp: 98 F (36.7 C)  SpO2: 98%    Physical Exam Constitutional:      General: She is not in acute distress.    Appearance: Normal appearance. She is not toxic-appearing.  HENT:     Head: Normocephalic and atraumatic.  Eyes:     General: No scleral icterus. Neck:     Comments: Cervical collar in place. Cardiovascular:     Rate and Rhythm: Normal rate and regular rhythm.     Pulses: Normal pulses.     Heart sounds: Normal heart sounds.  Pulmonary:     Effort: Pulmonary effort is normal.     Breath sounds: Normal breath sounds.  Chest:     Comments: Breast exam is deferred today due to patient's recent neck injury, decreased ability to move and the cervical collar that she is wearing. Abdominal:     General: Abdomen is flat. Bowel sounds are normal. There is no distension.     Palpations: Abdomen is soft.     Tenderness: There is no abdominal tenderness.  Musculoskeletal:         General: No swelling.  Skin:    General: Skin is warm and dry.     Findings: No rash.  Neurological:     General: No focal deficit present.     Mental Status: She is alert.  Psychiatric:        Mood and Affect: Mood normal.        Behavior: Behavior normal.     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  Joesphine has no clinical or radiographic signs of breast cancer recurrence.  I recommended that she continue to follow-up with annual mammograms next due in November 2024.  She will continue on tamoxifen daily.  Recommended healthy diet and exercise and that she could to do close follow-up with her primary care provider.  Other iron deficiency anemia Iron studies are pending today.  If decreased she will likely benefit from IV iron therapy due to her history of gastric bypass and decreased absorption noted when she takes oral iron.  We will follow-up with her on these results.    All questions were answered. The patient knows to call the clinic with any problems, questions or concerns. We can certainly see the patient much sooner if necessary.  Total encounter time:30 minutes*in face-to-face visit time, chart review, lab review, care coordination, order entry, and documentation of the encounter time.    Wilber Bihari, NP 05/04/22 2:41 PM Medical Oncology and Hematology [facility] [place], [place] [address] Tel. [phone]    Fax. [phone]  *Total Encounter Time as defined by the Centers for Medicare and Medicaid Services includes, in addition to the face-to-face time of a patient visit (documented in the note above) non-face-to-face  time: obtaining and reviewing outside history, ordering and reviewing medications, tests or procedures, care coordination (communications with other health care professionals or caregivers) and documentation in the medical record.

## 2022-05-04 NOTE — Assessment & Plan Note (Signed)
Iron studies are pending today.  If decreased she will likely benefit from IV iron therapy due to her history of gastric bypass and decreased absorption noted when she takes oral iron.  We will follow-up with her on these results.

## 2022-05-05 ENCOUNTER — Other Ambulatory Visit: Payer: Self-pay | Admitting: Hematology

## 2022-05-05 ENCOUNTER — Other Ambulatory Visit (INDEPENDENT_AMBULATORY_CARE_PROVIDER_SITE_OTHER): Payer: Self-pay | Admitting: Family Medicine

## 2022-05-05 DIAGNOSIS — F332 Major depressive disorder, recurrent severe without psychotic features: Secondary | ICD-10-CM | POA: Diagnosis not present

## 2022-05-05 DIAGNOSIS — F3289 Other specified depressive episodes: Secondary | ICD-10-CM

## 2022-05-05 DIAGNOSIS — C50212 Malignant neoplasm of upper-inner quadrant of left female breast: Secondary | ICD-10-CM

## 2022-05-05 DIAGNOSIS — D508 Other iron deficiency anemias: Secondary | ICD-10-CM

## 2022-05-06 ENCOUNTER — Ambulatory Visit (INDEPENDENT_AMBULATORY_CARE_PROVIDER_SITE_OTHER): Payer: BC Managed Care – PPO | Admitting: Family Medicine

## 2022-05-06 DIAGNOSIS — S12591D Other nondisplaced fracture of sixth cervical vertebra, subsequent encounter for fracture with routine healing: Secondary | ICD-10-CM | POA: Diagnosis not present

## 2022-05-12 DIAGNOSIS — F332 Major depressive disorder, recurrent severe without psychotic features: Secondary | ICD-10-CM | POA: Diagnosis not present

## 2022-05-14 ENCOUNTER — Telehealth: Payer: Self-pay

## 2022-05-14 NOTE — Telephone Encounter (Signed)
Called and given below message. Pt verbalized understanding and was appreciative of call.

## 2022-05-14 NOTE — Telephone Encounter (Signed)
-----   Message from Gardenia Phlegm, NP sent at 05/11/2022 10:23 AM EST ----- Please let patient know her iron levels are stable we will recheck them in August and give additional iron if needed. ----- Message ----- From: Truitt Merle, MD Sent: 05/08/2022   5:06 PM EST To: Gardenia Phlegm, NP  Mendel Ryder, please review her lab results and let her know, thx   YF

## 2022-05-19 ENCOUNTER — Encounter: Payer: Self-pay | Admitting: Pulmonary Disease

## 2022-05-19 DIAGNOSIS — F332 Major depressive disorder, recurrent severe without psychotic features: Secondary | ICD-10-CM | POA: Diagnosis not present

## 2022-05-20 MED ORDER — ZOLPIDEM TARTRATE ER 12.5 MG PO TBCR: 12.5000 mg | EXTENDED_RELEASE_TABLET | Freq: Every evening | ORAL | 2 refills | Status: DC | PRN

## 2022-05-20 NOTE — Telephone Encounter (Signed)
Refills for Ambien sent to pharmacy

## 2022-05-26 DIAGNOSIS — F332 Major depressive disorder, recurrent severe without psychotic features: Secondary | ICD-10-CM | POA: Diagnosis not present

## 2022-05-26 DIAGNOSIS — F1090 Alcohol use, unspecified, uncomplicated: Secondary | ICD-10-CM | POA: Diagnosis not present

## 2022-05-26 DIAGNOSIS — R1013 Epigastric pain: Secondary | ICD-10-CM | POA: Diagnosis not present

## 2022-05-26 DIAGNOSIS — R200 Anesthesia of skin: Secondary | ICD-10-CM | POA: Diagnosis not present

## 2022-05-26 DIAGNOSIS — D649 Anemia, unspecified: Secondary | ICD-10-CM | POA: Diagnosis not present

## 2022-05-26 DIAGNOSIS — R7989 Other specified abnormal findings of blood chemistry: Secondary | ICD-10-CM | POA: Diagnosis not present

## 2022-05-26 DIAGNOSIS — E559 Vitamin D deficiency, unspecified: Secondary | ICD-10-CM | POA: Diagnosis not present

## 2022-05-26 DIAGNOSIS — Z1331 Encounter for screening for depression: Secondary | ICD-10-CM | POA: Diagnosis not present

## 2022-05-26 DIAGNOSIS — R202 Paresthesia of skin: Secondary | ICD-10-CM | POA: Diagnosis not present

## 2022-05-26 DIAGNOSIS — Z9884 Bariatric surgery status: Secondary | ICD-10-CM | POA: Diagnosis not present

## 2022-05-26 DIAGNOSIS — Z0000 Encounter for general adult medical examination without abnormal findings: Secondary | ICD-10-CM | POA: Diagnosis not present

## 2022-05-26 DIAGNOSIS — E538 Deficiency of other specified B group vitamins: Secondary | ICD-10-CM | POA: Diagnosis not present

## 2022-06-01 DIAGNOSIS — M170 Bilateral primary osteoarthritis of knee: Secondary | ICD-10-CM | POA: Diagnosis not present

## 2022-06-01 DIAGNOSIS — Z6841 Body Mass Index (BMI) 40.0 and over, adult: Secondary | ICD-10-CM | POA: Diagnosis not present

## 2022-06-02 DIAGNOSIS — F332 Major depressive disorder, recurrent severe without psychotic features: Secondary | ICD-10-CM | POA: Diagnosis not present

## 2022-06-02 DIAGNOSIS — K649 Unspecified hemorrhoids: Secondary | ICD-10-CM | POA: Diagnosis not present

## 2022-06-03 DIAGNOSIS — Z886 Allergy status to analgesic agent status: Secondary | ICD-10-CM | POA: Diagnosis not present

## 2022-06-03 DIAGNOSIS — X58XXXD Exposure to other specified factors, subsequent encounter: Secondary | ICD-10-CM | POA: Diagnosis not present

## 2022-06-03 DIAGNOSIS — S12600D Unspecified displaced fracture of seventh cervical vertebra, subsequent encounter for fracture with routine healing: Secondary | ICD-10-CM | POA: Diagnosis not present

## 2022-06-03 DIAGNOSIS — S12591D Other nondisplaced fracture of sixth cervical vertebra, subsequent encounter for fracture with routine healing: Secondary | ICD-10-CM | POA: Diagnosis not present

## 2022-06-03 DIAGNOSIS — M4312 Spondylolisthesis, cervical region: Secondary | ICD-10-CM | POA: Diagnosis not present

## 2022-06-03 DIAGNOSIS — Z888 Allergy status to other drugs, medicaments and biological substances status: Secondary | ICD-10-CM | POA: Diagnosis not present

## 2022-06-03 DIAGNOSIS — Z885 Allergy status to narcotic agent status: Secondary | ICD-10-CM | POA: Diagnosis not present

## 2022-06-03 DIAGNOSIS — S12501D Unspecified nondisplaced fracture of sixth cervical vertebra, subsequent encounter for fracture with routine healing: Secondary | ICD-10-CM | POA: Diagnosis not present

## 2022-06-04 DIAGNOSIS — F332 Major depressive disorder, recurrent severe without psychotic features: Secondary | ICD-10-CM | POA: Diagnosis not present

## 2022-06-09 DIAGNOSIS — F333 Major depressive disorder, recurrent, severe with psychotic symptoms: Secondary | ICD-10-CM | POA: Diagnosis not present

## 2022-06-18 DIAGNOSIS — M170 Bilateral primary osteoarthritis of knee: Secondary | ICD-10-CM | POA: Diagnosis not present

## 2022-06-23 DIAGNOSIS — F332 Major depressive disorder, recurrent severe without psychotic features: Secondary | ICD-10-CM | POA: Diagnosis not present

## 2022-06-25 DIAGNOSIS — F332 Major depressive disorder, recurrent severe without psychotic features: Secondary | ICD-10-CM | POA: Diagnosis not present

## 2022-06-25 DIAGNOSIS — R1013 Epigastric pain: Secondary | ICD-10-CM | POA: Diagnosis not present

## 2022-06-25 DIAGNOSIS — M170 Bilateral primary osteoarthritis of knee: Secondary | ICD-10-CM | POA: Diagnosis not present

## 2022-07-01 DIAGNOSIS — S12591D Other nondisplaced fracture of sixth cervical vertebra, subsequent encounter for fracture with routine healing: Secondary | ICD-10-CM | POA: Diagnosis not present

## 2022-07-01 DIAGNOSIS — Z886 Allergy status to analgesic agent status: Secondary | ICD-10-CM | POA: Diagnosis not present

## 2022-07-01 DIAGNOSIS — J45909 Unspecified asthma, uncomplicated: Secondary | ICD-10-CM | POA: Diagnosis not present

## 2022-07-01 DIAGNOSIS — F32A Depression, unspecified: Secondary | ICD-10-CM | POA: Diagnosis not present

## 2022-07-01 DIAGNOSIS — S12501D Unspecified nondisplaced fracture of sixth cervical vertebra, subsequent encounter for fracture with routine healing: Secondary | ICD-10-CM | POA: Diagnosis not present

## 2022-07-01 DIAGNOSIS — K589 Irritable bowel syndrome without diarrhea: Secondary | ICD-10-CM | POA: Diagnosis not present

## 2022-07-01 DIAGNOSIS — M50323 Other cervical disc degeneration at C6-C7 level: Secondary | ICD-10-CM | POA: Diagnosis not present

## 2022-07-01 DIAGNOSIS — M4312 Spondylolisthesis, cervical region: Secondary | ICD-10-CM | POA: Diagnosis not present

## 2022-07-01 DIAGNOSIS — Z885 Allergy status to narcotic agent status: Secondary | ICD-10-CM | POA: Diagnosis not present

## 2022-07-01 DIAGNOSIS — F419 Anxiety disorder, unspecified: Secondary | ICD-10-CM | POA: Diagnosis not present

## 2022-07-01 DIAGNOSIS — F1090 Alcohol use, unspecified, uncomplicated: Secondary | ICD-10-CM | POA: Diagnosis not present

## 2022-07-02 DIAGNOSIS — L719 Rosacea, unspecified: Secondary | ICD-10-CM | POA: Diagnosis not present

## 2022-07-03 DIAGNOSIS — M170 Bilateral primary osteoarthritis of knee: Secondary | ICD-10-CM | POA: Diagnosis not present

## 2022-07-07 DIAGNOSIS — F331 Major depressive disorder, recurrent, moderate: Secondary | ICD-10-CM | POA: Diagnosis not present

## 2022-07-07 DIAGNOSIS — F411 Generalized anxiety disorder: Secondary | ICD-10-CM | POA: Diagnosis not present

## 2022-07-14 DIAGNOSIS — F332 Major depressive disorder, recurrent severe without psychotic features: Secondary | ICD-10-CM | POA: Diagnosis not present

## 2022-07-14 DIAGNOSIS — F411 Generalized anxiety disorder: Secondary | ICD-10-CM | POA: Diagnosis not present

## 2022-07-16 DIAGNOSIS — F332 Major depressive disorder, recurrent severe without psychotic features: Secondary | ICD-10-CM | POA: Diagnosis not present

## 2022-07-21 DIAGNOSIS — Z800 Family history of malignant neoplasm of digestive organs: Secondary | ICD-10-CM | POA: Diagnosis not present

## 2022-07-22 DIAGNOSIS — Z9884 Bariatric surgery status: Secondary | ICD-10-CM | POA: Diagnosis not present

## 2022-07-22 DIAGNOSIS — K2970 Gastritis, unspecified, without bleeding: Secondary | ICD-10-CM | POA: Diagnosis not present

## 2022-07-22 DIAGNOSIS — R109 Unspecified abdominal pain: Secondary | ICD-10-CM | POA: Diagnosis not present

## 2022-07-28 DIAGNOSIS — F332 Major depressive disorder, recurrent severe without psychotic features: Secondary | ICD-10-CM | POA: Diagnosis not present

## 2022-07-29 ENCOUNTER — Other Ambulatory Visit (INDEPENDENT_AMBULATORY_CARE_PROVIDER_SITE_OTHER): Payer: Self-pay | Admitting: Family Medicine

## 2022-07-29 ENCOUNTER — Encounter (INDEPENDENT_AMBULATORY_CARE_PROVIDER_SITE_OTHER): Payer: Self-pay | Admitting: Family Medicine

## 2022-07-29 ENCOUNTER — Ambulatory Visit (INDEPENDENT_AMBULATORY_CARE_PROVIDER_SITE_OTHER): Payer: BC Managed Care – PPO | Admitting: Family Medicine

## 2022-07-29 VITALS — BP 115/82 | HR 74 | Temp 98.2°F | Ht 65.0 in | Wt 298.0 lb

## 2022-07-29 DIAGNOSIS — E559 Vitamin D deficiency, unspecified: Secondary | ICD-10-CM | POA: Diagnosis not present

## 2022-07-29 DIAGNOSIS — R4689 Other symptoms and signs involving appearance and behavior: Secondary | ICD-10-CM

## 2022-07-29 DIAGNOSIS — Z6841 Body Mass Index (BMI) 40.0 and over, adult: Secondary | ICD-10-CM

## 2022-07-29 DIAGNOSIS — E669 Obesity, unspecified: Secondary | ICD-10-CM | POA: Diagnosis not present

## 2022-07-29 DIAGNOSIS — F3289 Other specified depressive episodes: Secondary | ICD-10-CM

## 2022-07-29 MED ORDER — VITAMIN D (ERGOCALCIFEROL) 1.25 MG (50000 UNIT) PO CAPS: 50000.0000 [IU] | ORAL_CAPSULE | ORAL | 0 refills | Status: DC

## 2022-07-29 MED ORDER — TOPIRAMATE 50 MG PO TABS: 50.0000 mg | ORAL_TABLET | Freq: Every day | ORAL | 0 refills | Status: DC

## 2022-08-03 NOTE — Progress Notes (Unsigned)
Chief Complaint:   OBESITY Tracy [name] is here to discuss her progress with her obesity treatment plan along with follow-up of her obesity related diagnoses. Tracy [name] is on keeping a food journal and adhering to recommended goals of 1400-1700 calories and 100+ grams of protein and states she is following her eating plan approximately 85% of the time. Tracy [name] states she is walking 1 mile 7 times per week.     Today's visit was #: 8 Starting weight: 305 lbs Starting date: 11/19/2021 Today's weight: 298 lbs Today's date: 07/29/2022 Total lbs lost to date: 7 Total lbs lost since last in-office visit: 0  Interim History: Tracy [name]'s last visit in the office was >4 months ago. She has been dealing with a lot of health issues. She has not been able to concentrate on weight loss, but she feels she is ready to get back on track. She has done well with increasing her protein.   Subjective:   1. Vitamin D deficiency Tracy [name]'s Vitamin D level has increased but is not yet at goal.   2. Emotional Eating Behavior Tracy [name] is on Topamax and she feels it may be helping her decrease emotional eating behavior. She wonders if a higher dose would be helpful.   Assessment/Plan:   1. Vitamin D deficiency Tracy [name] agreed to restart prescription Vitamin D 50,000 once weekly with no refills. We will recheck labs in 3 months.   - Vitamin D, Ergocalciferol, (DRISDOL) 1.25 MG (50000 UNIT) CAPS capsule; Take 1 capsule (50,000 Units total) by mouth every 7 (seven) days.  Dispense: 4 capsule; Refill: 0  2. Emotional Eating Behavior Tracy [name] agreed to increase Topamax to 100 mg qhs with no refills. She was reminded of potential side effects, and we will follow-up in 1 month.   - topiramate (TOPAMAX) 50 MG tablet; Take 1 tablet (50 mg total) by mouth daily.  Dispense: 30 tablet; Refill: 0  3. BMI 45.0-49.9, adult (HCC)  4. Obesity, Beginning BMI 50.59 Tracy [name] is currently in the action stage of change. As such, her goal is to get back to  weightloss efforts . She has agreed to keeping a food journal and adhering to recommended goals of 1400-1500 calories and 100+ grams of protein daily.   Exercise goals: As is.   Behavioral modification strategies: meal planning and cooking strategies.  Tracy [name] has agreed to follow-up with our clinic in 4 weeks. She was informed of the importance of frequent follow-up visits to maximize her success with intensive lifestyle modifications for her multiple health conditions.   Objective:   Blood pressure 115/82, pulse 74, temperature 98.2 F (36.8 C), height 5\' 5"  (1.651 m), weight 298 lb (135.2 kg), last menstrual period 07/20/2008, SpO2 99 %. Body mass index is 49.59 kg/m.  Lab Results  Component Value Date   CREATININE 0.61 11/19/2021   BUN 9 11/19/2021   NA 144 11/19/2021   K 3.6 11/19/2021   CL 103 11/19/2021   CO2 25 11/19/2021   Lab Results  Component Value Date   ALT 29 11/19/2021   AST 26 11/19/2021   ALKPHOS 98 11/19/2021   BILITOT <0.2 11/19/2021   Lab Results  Component Value Date   HGBA1C 5.6 11/19/2021   Lab Results  Component Value Date   INSULIN 5.6 11/19/2021   Lab Results  Component Value Date   TSH 1.090 11/19/2021   Lab Results  Component Value Date   CHOL 193 11/19/2021   HDL 107 11/19/2021   LDLCALC 73 11/19/2021  TRIG 73 11/19/2021   Lab Results  Component Value Date   VD25OH 12.6 (L) 11/19/2021   Lab Results  Component Value Date   WBC 4.6 11/19/2021   HGB 11.0 (L) 11/19/2021   HCT 35.8 11/19/2021   MCV 77 (L) 11/19/2021   PLT 301 11/07/2021   Lab Results  Component Value Date   IRON 108 05/04/2022   TIBC 482 (H) 05/04/2022   FERRITIN 23 05/04/2022   Attestation Statements:   Reviewed by clinician on day of visit: allergies, medications, problem list, medical history, surgical history, family history, social history, and previous encounter notes.   I, Burt Knack, am acting as transcriptionist for Quillian Quince, MD.  I  have reviewed the above documentation for accuracy and completeness, and I agree with the above. -  Quillian Quince, MD

## 2022-08-04 DIAGNOSIS — F332 Major depressive disorder, recurrent severe without psychotic features: Secondary | ICD-10-CM | POA: Diagnosis not present

## 2022-08-06 DIAGNOSIS — F332 Major depressive disorder, recurrent severe without psychotic features: Secondary | ICD-10-CM | POA: Diagnosis not present

## 2022-08-13 DIAGNOSIS — F332 Major depressive disorder, recurrent severe without psychotic features: Secondary | ICD-10-CM | POA: Diagnosis not present

## 2022-08-18 DIAGNOSIS — F332 Major depressive disorder, recurrent severe without psychotic features: Secondary | ICD-10-CM | POA: Diagnosis not present

## 2022-08-19 ENCOUNTER — Encounter (INDEPENDENT_AMBULATORY_CARE_PROVIDER_SITE_OTHER): Payer: Self-pay | Admitting: Family Medicine

## 2022-08-19 ENCOUNTER — Ambulatory Visit (INDEPENDENT_AMBULATORY_CARE_PROVIDER_SITE_OTHER): Payer: BC Managed Care – PPO | Admitting: Family Medicine

## 2022-08-19 VITALS — BP 109/65 | HR 67 | Temp 97.9°F | Ht 65.0 in | Wt 298.0 lb

## 2022-08-19 DIAGNOSIS — E88819 Insulin resistance, unspecified: Secondary | ICD-10-CM

## 2022-08-19 DIAGNOSIS — Z6841 Body Mass Index (BMI) 40.0 and over, adult: Secondary | ICD-10-CM | POA: Diagnosis not present

## 2022-08-19 DIAGNOSIS — F3289 Other specified depressive episodes: Secondary | ICD-10-CM | POA: Diagnosis not present

## 2022-08-19 DIAGNOSIS — E669 Obesity, unspecified: Secondary | ICD-10-CM | POA: Diagnosis not present

## 2022-08-19 MED ORDER — TOPIRAMATE 100 MG PO TABS: 100.0000 mg | ORAL_TABLET | Freq: Every day | ORAL | 0 refills | Status: DC

## 2022-08-19 MED ORDER — METFORMIN HCL 500 MG PO TABS: 500.0000 mg | ORAL_TABLET | Freq: Two times a day (BID) | ORAL | 3 refills | Status: DC

## 2022-08-24 NOTE — Progress Notes (Signed)
Chief Complaint:   OBESITY Tracy [name] is here to discuss her progress with her obesity treatment plan along with follow-up of her obesity related diagnoses. Tracy [name] is on keeping a food journal and adhering to recommended goals of 1400-1500 calories and 100+ grams of protein and states she is following her eating plan approximately 95% of the time. Tracy [name] states she is walking 2 miles for 30-45 minutes 7 times per week.  Today's visit was #: 9 Starting weight: 305 lbs Starting date: 11/19/2021 Today's weight: 298 lbs Today's date: 08/19/2022 Total lbs lost to date: 7 Total lbs lost since last in-office visit: 0  Interim History: Patient has done well with maintaining her weight.  She is working on meal planning and prepping, and eating healthier.  She is walking most days of the week.  She may not always be meeting her protein goals however.  Subjective:   1. Insulin resistance Patient has been out of metformin, but she is ready to restart.  She is working on her diet and exercise.  2. Other depression with emotional eating Patient is on Topamax and increased her dose to 100 mg at her last visit.  She is not having any side effects and she requests a refill today.  Assessment/Plan:   1. Insulin resistance Patient agreed to restart metformin 500 mg every morning with a 90-day supply.  - metFORMIN (GLUCOPHAGE) 500 MG tablet; Take 1 tablet (500 mg total) by mouth 2 (two) times daily with a meal.  Dispense: 180 tablet; Refill: 3  2. Other depression with emotional eating Patient agreed to increase Topamax to 100 mg nightly, and we will refill for 1 month.  - topiramate (TOPAMAX) 100 MG tablet; Take 1 tablet (100 mg total) by mouth daily.  Dispense: 30 tablet; Refill: 0  3. BMI 45.0-49.9, adult (HCC)  4. Obesity, Beginning BMI 50.59 Tracy [name] is currently in the action stage of change. As such, her goal is to continue with weight loss efforts. She has agreed to keeping a food journal and  adhering to recommended goals of 1400-1500 calories and 100+ grams of protein daily.   Exercise goals: As is.   Behavioral modification strategies: keeping a strict food journal.  Tracy [name] has agreed to follow-up with our clinic in 2 to 3 weeks. She was informed of the importance of frequent follow-up visits to maximize her success with intensive lifestyle modifications for her multiple health conditions.   Objective:   Blood pressure 109/65, pulse 67, temperature 97.9 F (36.6 C), height 5\' 5"  (1.651 m), weight 298 lb (135.2 kg), last menstrual period 07/20/2008, SpO2 99 %. Body mass index is 49.59 kg/m.  Lab Results  Component Value Date   CREATININE 0.61 11/19/2021   BUN 9 11/19/2021   NA 144 11/19/2021   K 3.6 11/19/2021   CL 103 11/19/2021   CO2 25 11/19/2021   Lab Results  Component Value Date   ALT 29 11/19/2021   AST 26 11/19/2021   ALKPHOS 98 11/19/2021   BILITOT <0.2 11/19/2021   Lab Results  Component Value Date   HGBA1C 5.6 11/19/2021   Lab Results  Component Value Date   INSULIN 5.6 11/19/2021   Lab Results  Component Value Date   TSH 1.090 11/19/2021   Lab Results  Component Value Date   CHOL 193 11/19/2021   HDL 107 11/19/2021   LDLCALC 73 11/19/2021   TRIG 73 11/19/2021   Lab Results  Component Value Date   VD25OH 12.6 (L)  11/19/2021   Lab Results  Component Value Date   WBC 4.6 11/19/2021   HGB 11.0 (L) 11/19/2021   HCT 35.8 11/19/2021   MCV 77 (L) 11/19/2021   PLT 301 11/07/2021   Lab Results  Component Value Date   IRON 108 05/04/2022   TIBC 482 (H) 05/04/2022   FERRITIN 23 05/04/2022   Attestation Statements:   Reviewed by clinician on day of visit: allergies, medications, problem list, medical history, surgical history, family history, social history, and previous encounter notes.   I, Burt Knack, am acting as transcriptionist for Quillian Quince, MD.  I have reviewed the above documentation for accuracy and completeness,  and I agree with the above. -  Quillian Quince, MD

## 2022-08-28 DIAGNOSIS — F332 Major depressive disorder, recurrent severe without psychotic features: Secondary | ICD-10-CM | POA: Diagnosis not present

## 2022-09-02 ENCOUNTER — Other Ambulatory Visit (INDEPENDENT_AMBULATORY_CARE_PROVIDER_SITE_OTHER): Payer: Self-pay | Admitting: Family Medicine

## 2022-09-02 DIAGNOSIS — E559 Vitamin D deficiency, unspecified: Secondary | ICD-10-CM

## 2022-09-03 DIAGNOSIS — F900 Attention-deficit hyperactivity disorder, predominantly inattentive type: Secondary | ICD-10-CM | POA: Diagnosis not present

## 2022-09-03 DIAGNOSIS — F331 Major depressive disorder, recurrent, moderate: Secondary | ICD-10-CM | POA: Diagnosis not present

## 2022-09-03 DIAGNOSIS — F411 Generalized anxiety disorder: Secondary | ICD-10-CM | POA: Diagnosis not present

## 2022-09-08 DIAGNOSIS — F900 Attention-deficit hyperactivity disorder, predominantly inattentive type: Secondary | ICD-10-CM | POA: Diagnosis not present

## 2022-09-08 DIAGNOSIS — F331 Major depressive disorder, recurrent, moderate: Secondary | ICD-10-CM | POA: Diagnosis not present

## 2022-09-08 DIAGNOSIS — F411 Generalized anxiety disorder: Secondary | ICD-10-CM | POA: Diagnosis not present

## 2022-09-09 ENCOUNTER — Ambulatory Visit (INDEPENDENT_AMBULATORY_CARE_PROVIDER_SITE_OTHER): Payer: BC Managed Care – PPO | Admitting: Family Medicine

## 2022-09-09 ENCOUNTER — Other Ambulatory Visit (INDEPENDENT_AMBULATORY_CARE_PROVIDER_SITE_OTHER): Payer: Self-pay | Admitting: Family Medicine

## 2022-09-09 ENCOUNTER — Telehealth (INDEPENDENT_AMBULATORY_CARE_PROVIDER_SITE_OTHER): Payer: Self-pay | Admitting: Family Medicine

## 2022-09-09 ENCOUNTER — Encounter (INDEPENDENT_AMBULATORY_CARE_PROVIDER_SITE_OTHER): Payer: Self-pay | Admitting: Family Medicine

## 2022-09-09 VITALS — BP 114/73 | HR 72 | Temp 98.3°F | Ht 65.0 in | Wt 289.0 lb

## 2022-09-09 DIAGNOSIS — Z6841 Body Mass Index (BMI) 40.0 and over, adult: Secondary | ICD-10-CM

## 2022-09-09 DIAGNOSIS — F3289 Other specified depressive episodes: Secondary | ICD-10-CM | POA: Diagnosis not present

## 2022-09-09 DIAGNOSIS — R7303 Prediabetes: Secondary | ICD-10-CM | POA: Diagnosis not present

## 2022-09-09 DIAGNOSIS — E669 Obesity, unspecified: Secondary | ICD-10-CM

## 2022-09-09 MED ORDER — TOPIRAMATE 100 MG PO TABS: 100.0000 mg | ORAL_TABLET | Freq: Every day | ORAL | 0 refills | Status: DC

## 2022-09-09 MED ORDER — METFORMIN HCL 500 MG PO TABS: 500.0000 mg | ORAL_TABLET | Freq: Two times a day (BID) | ORAL | 3 refills | Status: DC

## 2022-09-09 NOTE — Telephone Encounter (Signed)
Patient stated she needs a refill of Topiramate. She stated she forgot to tell Dr.Beasley

## 2022-09-10 NOTE — Progress Notes (Signed)
Chief Complaint:   OBESITY Tracy [name] is here to discuss her progress with her obesity treatment plan along with follow-up of her obesity related diagnoses. Tracy [name] is on keeping a food journal and adhering to recommended goals of 1400-1500 calories and 100+ grams of protein and states she is following her eating plan approximately 90% of the time. Tracy [name] states she is walking for 30 minutes 7 times per week.  Today's visit was #: 10 Starting weight: 305 lbs Starting date: 11/19/2021 Today's weight: 289 lbs Today's date: 09/09/2022 Total lbs lost to date: 16 Total lbs lost since last in-office visit: 9  Interim History: Patient has done very well with her weight loss on her food journaling plan.  She has some indulgences but overall she did well.  Subjective:   1. Pre-diabetes Patient started metformin and she notes decreased polyphagia.  She had some initial GI upset but this has mostly resolved.  2. Emotional Eating Behavior Patient continues to do well with decreasing emotional eating behavior.  She is working on having a healthier relationship with food and decreasing "reward" eating.  Assessment/Plan:   1. Pre-diabetes Patient will continue metformin twice daily, and we will refill for 90 days.  - metFORMIN (GLUCOPHAGE) 500 MG tablet; Take 1 tablet (500 mg total) by mouth 2 (two) times daily with a meal.  Dispense: 180 tablet; Refill: 3  2. Emotional Eating Behavior Patient will continue Topamax, and we will refill for 1 month.  - topiramate (TOPAMAX) 100 MG tablet; Take 1 tablet (100 mg total) by mouth daily.  Dispense: 30 tablet; Refill: 0  3. BMI 45.0-49.9, adult (HCC)  4. Obesity, Beginning BMI 50.59 Tracy [name] is currently in the action stage of change. As such, her goal is to continue with weight loss efforts. She has agreed to keeping a food journal and adhering to recommended goals of 1400-1500 calories and 100+ grams of protein daily.   Exercise goals: As is.   Behavioral  modification strategies: increasing lean protein intake.  Tracy [name] has agreed to follow-up with our clinic in 4 weeks. She was informed of the importance of frequent follow-up visits to maximize her success with intensive lifestyle modifications for her multiple health conditions.   Objective:   Blood pressure 114/73, pulse 72, temperature 98.3 F (36.8 C), height 5\' 5"  (1.651 m), weight 289 lb (131.1 kg), last menstrual period 07/20/2008, SpO2 98 %. Body mass index is 48.09 kg/m.  Lab Results  Component Value Date   CREATININE 0.61 11/19/2021   BUN 9 11/19/2021   NA 144 11/19/2021   K 3.6 11/19/2021   CL 103 11/19/2021   CO2 25 11/19/2021   Lab Results  Component Value Date   ALT 29 11/19/2021   AST 26 11/19/2021   ALKPHOS 98 11/19/2021   BILITOT <0.2 11/19/2021   Lab Results  Component Value Date   HGBA1C 5.6 11/19/2021   Lab Results  Component Value Date   INSULIN 5.6 11/19/2021   Lab Results  Component Value Date   TSH 1.090 11/19/2021   Lab Results  Component Value Date   CHOL 193 11/19/2021   HDL 107 11/19/2021   LDLCALC 73 11/19/2021   TRIG 73 11/19/2021   Lab Results  Component Value Date   VD25OH 12.6 (L) 11/19/2021   Lab Results  Component Value Date   WBC 4.6 11/19/2021   HGB 11.0 (L) 11/19/2021   HCT 35.8 11/19/2021   MCV 77 (L) 11/19/2021   PLT 301 11/07/2021  Lab Results  Component Value Date   IRON 108 05/04/2022   TIBC 482 (H) 05/04/2022   FERRITIN 23 05/04/2022   Attestation Statements:   Reviewed by clinician on day of visit: allergies, medications, problem list, medical history, surgical history, family history, social history, and previous encounter notes.   I, Burt Knack, am acting as transcriptionist for Quillian Quince, MD.  I have reviewed the above documentation for accuracy and completeness, and I agree with the above. -  Quillian Quince, MD

## 2022-09-17 DIAGNOSIS — F411 Generalized anxiety disorder: Secondary | ICD-10-CM | POA: Diagnosis not present

## 2022-09-17 DIAGNOSIS — F900 Attention-deficit hyperactivity disorder, predominantly inattentive type: Secondary | ICD-10-CM | POA: Diagnosis not present

## 2022-09-17 DIAGNOSIS — F331 Major depressive disorder, recurrent, moderate: Secondary | ICD-10-CM | POA: Diagnosis not present

## 2022-09-22 DIAGNOSIS — F332 Major depressive disorder, recurrent severe without psychotic features: Secondary | ICD-10-CM | POA: Diagnosis not present

## 2022-09-25 DIAGNOSIS — M4312 Spondylolisthesis, cervical region: Secondary | ICD-10-CM | POA: Diagnosis not present

## 2022-09-25 DIAGNOSIS — S12500A Unspecified displaced fracture of sixth cervical vertebra, initial encounter for closed fracture: Secondary | ICD-10-CM | POA: Diagnosis not present

## 2022-09-25 DIAGNOSIS — W19XXXD Unspecified fall, subsequent encounter: Secondary | ICD-10-CM | POA: Diagnosis not present

## 2022-09-25 DIAGNOSIS — S12591D Other nondisplaced fracture of sixth cervical vertebra, subsequent encounter for fracture with routine healing: Secondary | ICD-10-CM | POA: Diagnosis not present

## 2022-09-25 DIAGNOSIS — R519 Headache, unspecified: Secondary | ICD-10-CM | POA: Diagnosis not present

## 2022-09-25 DIAGNOSIS — M50323 Other cervical disc degeneration at C6-C7 level: Secondary | ICD-10-CM | POA: Diagnosis not present

## 2022-09-30 ENCOUNTER — Ambulatory Visit (INDEPENDENT_AMBULATORY_CARE_PROVIDER_SITE_OTHER): Payer: BC Managed Care – PPO | Admitting: Family Medicine

## 2022-09-30 ENCOUNTER — Encounter (INDEPENDENT_AMBULATORY_CARE_PROVIDER_SITE_OTHER): Payer: Self-pay | Admitting: Family Medicine

## 2022-09-30 VITALS — BP 106/66 | HR 94 | Temp 98.6°F | Ht 65.0 in | Wt 280.0 lb

## 2022-09-30 DIAGNOSIS — E669 Obesity, unspecified: Secondary | ICD-10-CM | POA: Diagnosis not present

## 2022-09-30 DIAGNOSIS — Z6841 Body Mass Index (BMI) 40.0 and over, adult: Secondary | ICD-10-CM

## 2022-09-30 DIAGNOSIS — F411 Generalized anxiety disorder: Secondary | ICD-10-CM | POA: Diagnosis not present

## 2022-09-30 DIAGNOSIS — F331 Major depressive disorder, recurrent, moderate: Secondary | ICD-10-CM | POA: Diagnosis not present

## 2022-09-30 DIAGNOSIS — R4689 Other symptoms and signs involving appearance and behavior: Secondary | ICD-10-CM

## 2022-09-30 DIAGNOSIS — F900 Attention-deficit hyperactivity disorder, predominantly inattentive type: Secondary | ICD-10-CM | POA: Diagnosis not present

## 2022-09-30 DIAGNOSIS — R7303 Prediabetes: Secondary | ICD-10-CM | POA: Diagnosis not present

## 2022-09-30 DIAGNOSIS — F3289 Other specified depressive episodes: Secondary | ICD-10-CM

## 2022-10-05 ENCOUNTER — Telehealth: Payer: Self-pay | Admitting: Pulmonary Disease

## 2022-10-05 NOTE — Progress Notes (Unsigned)
Chief Complaint:   OBESITY Tracy [name] is here to discuss her progress with her obesity treatment plan along with follow-up of her obesity related diagnoses. Tracy [name] is on keeping a food journal and adhering to recommended goals of 1400-1500 calories and 100+ grams of protein and states she is following her eating plan approximately 80% of the time. Tracy [name] states she is walking for 30 minutes 5 times per week.  Today's visit was #: 11 Starting weight: 305 lbs Starting date: 11/19/2021 Today's weight: 280 lbs Today's date: 09/30/2022 Total lbs lost to date: 25 Total lbs lost since last in-office visit: 9  Interim History: Patient has done better with journaling and meeting her protein goals.  She feels better overall and she does note more energy.  She is not feeling deprived.  Subjective:   1. Prediabetes Patient is doing well on metformin and with her diet.  She denies nausea or vomiting, and polyphagia has improved.  2. Emotional Eating Behavior Patient is doing well on Topamax.  She has decreased emotional eating behavior, and she is not feeling deprived.  She may have a low level of dysgeusia.   Assessment/Plan:   1. Prediabetes Patient will continue with her metformin, and with her diet and exercise.  2. Emotional Eating Behavior Patient will continue Topamax as is, and we will continue to monitor.  3. BMI 45.0-49.9, adult (HCC)  4. Obesity, Beginning BMI 50.59 Tracy [name] is currently in the action stage of change. As such, her goal is to continue with weight loss efforts. She has agreed to keeping a food journal and adhering to recommended goals of 1400-1500 calories and 100+ grams of protein daily.   Exercise goals: As is.   Behavioral modification strategies: meal planning and cooking strategies and keeping a strict food journal.  Tracy [name] has agreed to follow-up with our clinic in 3 to 4 weeks. She was informed of the importance of frequent follow-up visits to maximize her success  with intensive lifestyle modifications for her multiple health conditions.   Objective:   Blood pressure 106/66, pulse 94, temperature 98.6 F (37 C), height 5\' 5"  (1.651 m), weight 280 lb (127 kg), last menstrual period 07/20/2008, SpO2 96%. Body mass index is 46.59 kg/m.  Lab Results  Component Value Date   CREATININE 0.61 11/19/2021   BUN 9 11/19/2021   NA 144 11/19/2021   K 3.6 11/19/2021   CL 103 11/19/2021   CO2 25 11/19/2021   Lab Results  Component Value Date   ALT 29 11/19/2021   AST 26 11/19/2021   ALKPHOS 98 11/19/2021   BILITOT <0.2 11/19/2021   Lab Results  Component Value Date   HGBA1C 5.6 11/19/2021   Lab Results  Component Value Date   INSULIN 5.6 11/19/2021   Lab Results  Component Value Date   TSH 1.090 11/19/2021   Lab Results  Component Value Date   CHOL 193 11/19/2021   HDL 107 11/19/2021   LDLCALC 73 11/19/2021   TRIG 73 11/19/2021   Lab Results  Component Value Date   VD25OH 12.6 (L) 11/19/2021   Lab Results  Component Value Date   WBC 4.6 11/19/2021   HGB 11.0 (L) 11/19/2021   HCT 35.8 11/19/2021   MCV 77 (L) 11/19/2021   PLT 301 11/07/2021   Lab Results  Component Value Date   IRON 108 05/04/2022   TIBC 482 (H) 05/04/2022   FERRITIN 23 05/04/2022   Attestation Statements:   Reviewed by clinician on day  of visit: allergies, medications, problem list, medical history, surgical history, family history, social history, and previous encounter notes.  Time spent on visit including pre-visit chart review and post-visit care and charting was 30 minutes.   I, Burt Knack, am acting as transcriptionist for Quillian Quince, MD.  I have reviewed the above documentation for accuracy and completeness, and I agree with the above. -  Quillian Quince, MD

## 2022-10-06 DIAGNOSIS — G894 Chronic pain syndrome: Secondary | ICD-10-CM | POA: Diagnosis not present

## 2022-10-06 DIAGNOSIS — M542 Cervicalgia: Secondary | ICD-10-CM | POA: Diagnosis not present

## 2022-10-06 DIAGNOSIS — M7918 Myalgia, other site: Secondary | ICD-10-CM | POA: Diagnosis not present

## 2022-10-06 DIAGNOSIS — F332 Major depressive disorder, recurrent severe without psychotic features: Secondary | ICD-10-CM | POA: Diagnosis not present

## 2022-10-06 DIAGNOSIS — M47812 Spondylosis without myelopathy or radiculopathy, cervical region: Secondary | ICD-10-CM | POA: Diagnosis not present

## 2022-10-07 ENCOUNTER — Other Ambulatory Visit: Payer: Self-pay | Admitting: Pulmonary Disease

## 2022-10-07 NOTE — Telephone Encounter (Signed)
Per LOV 12/08/21: Follow-up in 6 months   LOV 12/08/21 Next OV 11/11/22 Last refill 05/20/22, #30, 2 refills  Please review, thanks!

## 2022-10-08 NOTE — Telephone Encounter (Signed)
Spoke with patient regarding medication refill request for Ambien 12.5mg   LOV : 12/08/2021 with Dr.Olalere  Tammy can you refill this medication.

## 2022-10-12 ENCOUNTER — Encounter: Payer: Self-pay | Admitting: Pulmonary Disease

## 2022-10-13 ENCOUNTER — Other Ambulatory Visit (INDEPENDENT_AMBULATORY_CARE_PROVIDER_SITE_OTHER): Payer: Self-pay | Admitting: Family Medicine

## 2022-10-13 DIAGNOSIS — F3289 Other specified depressive episodes: Secondary | ICD-10-CM

## 2022-10-20 ENCOUNTER — Other Ambulatory Visit: Payer: Self-pay | Admitting: Pulmonary Disease

## 2022-10-20 ENCOUNTER — Ambulatory Visit (INDEPENDENT_AMBULATORY_CARE_PROVIDER_SITE_OTHER): Payer: BC Managed Care – PPO | Admitting: Family Medicine

## 2022-10-20 ENCOUNTER — Encounter (INDEPENDENT_AMBULATORY_CARE_PROVIDER_SITE_OTHER): Payer: Self-pay | Admitting: Family Medicine

## 2022-10-20 VITALS — BP 111/73 | HR 77 | Temp 97.8°F | Ht 65.0 in | Wt 275.0 lb

## 2022-10-20 DIAGNOSIS — E669 Obesity, unspecified: Secondary | ICD-10-CM | POA: Diagnosis not present

## 2022-10-20 DIAGNOSIS — F3289 Other specified depressive episodes: Secondary | ICD-10-CM

## 2022-10-20 DIAGNOSIS — R7303 Prediabetes: Secondary | ICD-10-CM

## 2022-10-20 DIAGNOSIS — Z6841 Body Mass Index (BMI) 40.0 and over, adult: Secondary | ICD-10-CM | POA: Diagnosis not present

## 2022-10-20 MED ORDER — TOPIRAMATE 50 MG PO TABS: 50.0000 mg | ORAL_TABLET | Freq: Three times a day (TID) | ORAL | 0 refills | Status: DC

## 2022-10-20 MED ORDER — ZOLPIDEM TARTRATE ER 12.5 MG PO TBCR: 12.5000 mg | EXTENDED_RELEASE_TABLET | Freq: Every day | ORAL | 3 refills | Status: DC

## 2022-10-20 NOTE — Progress Notes (Unsigned)
Chief Complaint:   OBESITY Tracy [name] is here to discuss her progress with her obesity treatment plan along with follow-up of her obesity related diagnoses. Tracy [name] is on keeping a food journal and adhering to recommended goals of 1400-1500 calories and 100+ grams of protein and states she is following her eating plan approximately 75% of the time. Tracy [name] states she is walking for 30 minutes 7 times per week.  Today's visit was #: 12 Starting weight: 305 lbs Starting date: 11/19/2021 Today's weight: 275 lbs Today's date: 10/20/2022 Total lbs lost to date: 30 Total lbs lost since last in-office visit: 5  Interim History: Patient continues to do well with her weight loss.  She is working on journaling more and doing well with meeting her calorie goals, but she is struggling to meet her protein goals.  Subjective:   1. Pre-diabetes Patient is on metformin, and she is working on her diet and weight loss.  She notes feeling hypoglycemic at times but not very often.  2. Emotional Eating Behavior Patient has done well on Topamax, but she feels it has not helped her as much recently.  No side effects were noted.  Assessment/Plan:   1. Pre-diabetes Patient will continue metformin, and we will continue to increase her protein intake.  We will follow-up at her next visit in 1 month.  2. Emotional Eating Behavior Patient agreed to change Topamax to 50 mg 3 times daily with a 90-day supply, with no refills.  We will follow-up at her next visit in 1 month.  - topiramate (TOPAMAX) 50 MG tablet; Take 1 tablet (50 mg total) by mouth 3 (three) times daily.  Dispense: 90 tablet; Refill: 0  3. BMI 45.0-49.9, adult (HCC)  4. Obesity, Beginning BMI 50.59 Tracy [name] is currently in the action stage of change. As such, her goal is to continue with weight loss efforts. She has agreed to keeping a food journal and adhering to recommended goals of 1400-1500 calories and 100+ grams of protein daily.   Exercise goals:  As is.   Behavioral modification strategies: increasing lean protein intake.  Tracy [name] has agreed to follow-up with our clinic in 4 weeks. She was informed of the importance of frequent follow-up visits to maximize her success with intensive lifestyle modifications for her multiple health conditions.   Objective:   Blood pressure 111/73, pulse 77, temperature 97.8 F (36.6 C), height 5\' 5"  (1.651 m), weight 275 lb (124.7 kg), last menstrual period 07/20/2008, SpO2 95%. Body mass index is 45.76 kg/m.  Lab Results  Component Value Date   CREATININE 0.61 11/19/2021   BUN 9 11/19/2021   NA 144 11/19/2021   K 3.6 11/19/2021   CL 103 11/19/2021   CO2 25 11/19/2021   Lab Results  Component Value Date   ALT 29 11/19/2021   AST 26 11/19/2021   ALKPHOS 98 11/19/2021   BILITOT <0.2 11/19/2021   Lab Results  Component Value Date   HGBA1C 5.6 11/19/2021   Lab Results  Component Value Date   INSULIN 5.6 11/19/2021   Lab Results  Component Value Date   TSH 1.090 11/19/2021   Lab Results  Component Value Date   CHOL 193 11/19/2021   HDL 107 11/19/2021   LDLCALC 73 11/19/2021   TRIG 73 11/19/2021   Lab Results  Component Value Date   VD25OH 12.6 (L) 11/19/2021   Lab Results  Component Value Date   WBC 4.6 11/19/2021   HGB 11.0 (L) 11/19/2021  HCT 35.8 11/19/2021   MCV 77 (L) 11/19/2021   PLT 301 11/07/2021   Lab Results  Component Value Date   IRON 108 05/04/2022   TIBC 482 (H) 05/04/2022   FERRITIN 23 05/04/2022   Attestation Statements:   Reviewed by clinician on day of visit: allergies, medications, problem list, medical history, surgical history, family history, social history, and previous encounter notes.   I, Burt Knack, am acting as transcriptionist for Quillian Quince, MD.  I have reviewed the above documentation for accuracy and completeness, and I agree with the above. -  Quillian Quince, MD

## 2022-10-20 NOTE — Telephone Encounter (Signed)
Med refill sent in.

## 2022-10-21 NOTE — Telephone Encounter (Signed)
LVM to let patient know script has been sent over to her pharmacy.  Nothing else further needed.

## 2022-10-27 DIAGNOSIS — F332 Major depressive disorder, recurrent severe without psychotic features: Secondary | ICD-10-CM | POA: Diagnosis not present

## 2022-10-28 DIAGNOSIS — Z6841 Body Mass Index (BMI) 40.0 and over, adult: Secondary | ICD-10-CM | POA: Diagnosis not present

## 2022-10-28 DIAGNOSIS — R7989 Other specified abnormal findings of blood chemistry: Secondary | ICD-10-CM | POA: Diagnosis not present

## 2022-10-28 DIAGNOSIS — M170 Bilateral primary osteoarthritis of knee: Secondary | ICD-10-CM | POA: Diagnosis not present

## 2022-10-29 DIAGNOSIS — M4802 Spinal stenosis, cervical region: Secondary | ICD-10-CM | POA: Diagnosis not present

## 2022-10-29 DIAGNOSIS — M5412 Radiculopathy, cervical region: Secondary | ICD-10-CM | POA: Diagnosis not present

## 2022-10-29 DIAGNOSIS — M5030 Other cervical disc degeneration, unspecified cervical region: Secondary | ICD-10-CM | POA: Diagnosis not present

## 2022-10-30 ENCOUNTER — Other Ambulatory Visit: Payer: Self-pay | Admitting: Orthopedic Surgery

## 2022-10-30 ENCOUNTER — Other Ambulatory Visit: Payer: Self-pay

## 2022-10-30 ENCOUNTER — Encounter: Payer: Self-pay | Admitting: Orthopedic Surgery

## 2022-10-30 DIAGNOSIS — Z170 Estrogen receptor positive status [ER+]: Secondary | ICD-10-CM

## 2022-10-30 DIAGNOSIS — M4802 Spinal stenosis, cervical region: Secondary | ICD-10-CM

## 2022-11-01 ENCOUNTER — Other Ambulatory Visit: Payer: Self-pay | Admitting: Hematology

## 2022-11-01 DIAGNOSIS — E538 Deficiency of other specified B group vitamins: Secondary | ICD-10-CM

## 2022-11-01 NOTE — Assessment & Plan Note (Signed)
-  h/o gastric bypass, which is likely the cause of her anemia  -she previously received IV iron under care of Dr. Donneta Romberg in 2016-2017, continues B12 injections with PCP. -she endorses taking oral iron but does not feel it is helpful. -we will check her ferritin and iron levels to see if she needs additional IV iron

## 2022-11-01 NOTE — Assessment & Plan Note (Signed)
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  Oncotype RS 4 -Found on screening mammogram. S/p left lumpectomy 02/25/21 with Dr. Luisa Hart, pathology showed 0.9 cm IDC, benign margins and lymph nodes (0/3). -Oncotype RS of 4, indicating low risk.  -s/p adjuvant radiation under Dr. Basilio Cairo, 04/03/21 - 05/05/21 -she is s/p hysterectomy 07/2016, ovaries are still in place. Labs from 05/02/21 show she is still pre-menopausal, so she began tamoxifen in 05/2021. She is tolerating well with hot flashes -she is clinically doing well. Labs reviewed, she has stable anemia. Physical exam was unremarkable. There is no clinical concern for recurrence.

## 2022-11-01 NOTE — Assessment & Plan Note (Signed)
-  Secondary to gastric bypass surgery -B12 level in March 2024 was normal -She is on B12 injection monthly.

## 2022-11-02 ENCOUNTER — Inpatient Hospital Stay: Payer: BC Managed Care – PPO | Attending: Hematology

## 2022-11-02 ENCOUNTER — Encounter: Payer: Self-pay | Admitting: Hematology

## 2022-11-02 ENCOUNTER — Inpatient Hospital Stay: Payer: BC Managed Care – PPO | Admitting: Hematology

## 2022-11-02 ENCOUNTER — Other Ambulatory Visit: Payer: Self-pay

## 2022-11-02 VITALS — BP 115/71 | HR 69 | Temp 98.7°F | Resp 18 | Wt 279.1 lb

## 2022-11-02 DIAGNOSIS — E538 Deficiency of other specified B group vitamins: Secondary | ICD-10-CM

## 2022-11-02 DIAGNOSIS — Z170 Estrogen receptor positive status [ER+]: Secondary | ICD-10-CM | POA: Diagnosis not present

## 2022-11-02 DIAGNOSIS — Z923 Personal history of irradiation: Secondary | ICD-10-CM | POA: Diagnosis not present

## 2022-11-02 DIAGNOSIS — C50212 Malignant neoplasm of upper-inner quadrant of left female breast: Secondary | ICD-10-CM

## 2022-11-02 DIAGNOSIS — Z79810 Long term (current) use of selective estrogen receptor modulators (SERMs): Secondary | ICD-10-CM | POA: Diagnosis not present

## 2022-11-02 DIAGNOSIS — Z79899 Other long term (current) drug therapy: Secondary | ICD-10-CM | POA: Insufficient documentation

## 2022-11-02 DIAGNOSIS — D508 Other iron deficiency anemias: Secondary | ICD-10-CM

## 2022-11-02 DIAGNOSIS — Z9884 Bariatric surgery status: Secondary | ICD-10-CM | POA: Diagnosis not present

## 2022-11-02 LAB — CMP (CANCER CENTER ONLY)
ALT: 32 U/L (ref 0–44)
AST: 23 U/L (ref 15–41)
Albumin: 4.3 g/dL (ref 3.5–5.0)
Alkaline Phosphatase: 52 U/L (ref 38–126)
Anion gap: 8 (ref 5–15)
BUN: 19 mg/dL (ref 6–20)
CO2: 29 mmol/L (ref 22–32)
Calcium: 9.3 mg/dL (ref 8.9–10.3)
Chloride: 104 mmol/L (ref 98–111)
Creatinine: 0.84 mg/dL (ref 0.44–1.00)
GFR, Estimated: >60 mL/min (ref >60)
Glucose, Bld: 99 mg/dL (ref 70–99)
Potassium: 3.4 mmol/L — ABNORMAL LOW (ref 3.5–5.1)
Sodium: 141 mmol/L (ref 135–145)
Total Bilirubin: 0.4 mg/dL (ref 0.3–1.2)
Total Protein: 7.3 g/dL (ref 6.5–8.1)

## 2022-11-02 LAB — CBC WITH DIFFERENTIAL (CANCER CENTER ONLY)
Abs Immature Granulocytes: 0.02 10*3/uL (ref 0.00–0.07)
Basophils Absolute: 0.1 10*3/uL (ref 0.0–0.1)
Basophils Relative: 1 %
Eosinophils Absolute: 0.1 10*3/uL (ref 0.0–0.5)
Eosinophils Relative: 1 %
HCT: 42.3 % (ref 36.0–46.0)
Hemoglobin: 13.9 g/dL (ref 12.0–15.0)
Immature Granulocytes: 0 %
Lymphocytes Relative: 34 %
Lymphs Abs: 2.3 10*3/uL (ref 0.7–4.0)
MCH: 29.3 pg (ref 26.0–34.0)
MCHC: 32.9 g/dL (ref 30.0–36.0)
MCV: 89.2 fL (ref 80.0–100.0)
Monocytes Absolute: 0.4 10*3/uL (ref 0.1–1.0)
Monocytes Relative: 6 %
Neutro Abs: 4 10*3/uL (ref 1.7–7.7)
Neutrophils Relative %: 58 %
Platelet Count: 256 10*3/uL (ref 150–400)
RBC: 4.74 MIL/uL (ref 3.87–5.11)
RDW: 13.7 % (ref 11.5–15.5)
WBC Count: 6.9 10*3/uL (ref 4.0–10.5)
nRBC: 0 % (ref 0.0–0.2)

## 2022-11-02 LAB — FERRITIN: Ferritin: 37 ng/mL (ref 11–307)

## 2022-11-02 LAB — IRON AND IRON BINDING CAPACITY (CC-WL,HP ONLY)
Iron: 123 ug/dL (ref 28–170)
Saturation Ratios: 33 % — ABNORMAL HIGH (ref 10.4–31.8)
TIBC: 378 ug/dL (ref 250–450)
UIBC: 255 ug/dL (ref 148–442)

## 2022-11-02 LAB — VITAMIN B12: Vitamin B-12: 1318 pg/mL — ABNORMAL HIGH (ref 180–914)

## 2022-11-02 MED ORDER — TAMOXIFEN CITRATE 20 MG PO TABS: ORAL_TABLET | ORAL | 3 refills | Status: DC

## 2022-11-02 NOTE — Progress Notes (Signed)
[facility]   [phone] [phone]   Clinic Follow up Note   Patient Care Team: Malachy Mood, MD as PCP - General (Hematology) Malachy Mood, MD as Consulting Physician (Hematology) Lonie Peak, MD as Attending Physician (Radiation Oncology) Harriette Bouillon, MD as Consulting Physician (General Surgery)  Date of Service:  11/02/2022  CHIEF COMPLAINT: f/u of left breast cancer  CURRENT THERAPY:  Tamoxifen started 05/2021  ASSESSMENT:  Tracy [name] is a 57 y.o. 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  Oncotype RS 4 -Found on screening mammogram. S/p left lumpectomy 02/25/21 with Dr. Luisa Hart, pathology showed 0.9 cm IDC, benign margins and lymph nodes (0/3). -Oncotype RS of 4, indicating low risk.  -s/p adjuvant radiation under Dr. Basilio Cairo, 04/03/21 - 05/05/21 -she is s/p hysterectomy 07/2016, ovaries are still in place. Labs from 05/02/21 show she is still pre-menopausal, so she began tamoxifen in 05/2021. She is tolerating well with hot flashes -she is clinically doing well. Labs reviewed, she has stable anemia. Physical exam was unremarkable. There is no clinical concern for recurrence.  Other iron deficiency anemia -h/o gastric bypass, which is likely the cause of her anemia  -she previously received IV iron under care of Dr. Donneta Romberg in 2016-2017, continues B12 injections with PCP. -she endorses taking oral iron but does not feel it is helpful. -we will check her ferritin and iron levels to see if she needs additional IV iron  Vitamin B12 deficiency -Secondary to gastric bypass surgery -B12 level in March 2024 was normal -She is on B12 injection monthly at her PCP office.    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) Staging form: Breast, AJCC 8th Edition - Clinical stage from 02/12/2021: Stage IA (cT1b, cN0, cM0, G2, ER+, PR+, HER2-) - Signed by Lonie Peak, MD on 02/12/2021 Stage prefix: Initial diagnosis Histologic grading system: 3 grade system - Pathologic stage from 02/25/2021: Stage IA (pT1b, pN0, cM0, G2, ER+, PR+, HER2-, Oncotype DX score: 3) - Signed by Malachy Mood, MD on 05/03/2021 Stage prefix: Initial diagnosis Multigene prognostic tests performed: Oncotype DX Recurrence score range: Less than 11 Histologic grading system: 3 grade system Residual tumor (R): R0 - None     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 Suspicious left breast mass corresponding with the screening mammographic findings. 2. No suspicious left axillary lymphadenopathy.   RECOMMENDATION: Ultrasound-guided biopsy of the left breast.   01/23/2021 Procedure   ULTRASOUND GUIDED LEFT BREAST CORE NEEDLE BIOPSY   IMPRESSION: Ultrasound guided biopsy of a mass in the left breast at 11 o'clock. No apparent complications.   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 Based on the biopsy, the carcinoma appears Nottingham grade 2 of 3 and measures 0.4 cm in greatest linear extent.  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 BREAST, LEFT ADDITIONAL ANTERIOSUPERIOR MARGIN, EXCISION:  - Benign breast parenchyma, negative for carcinoma   C. BREAST, LEFT ADDITIONAL MEDIOPOSTERIOR MARGIN, EXCISION:  - Benign breast parenchyma, negative for carcinoma   D. BREAST, LEFT ADDITIONAL INFERIOLATERAL MARGIN, EXCISION:  - Benign breast parenchyma, negative for carcinoma   E. LYMPH NODE, LEFT AXILLARY, SENTINEL, EXCISION:  - Lymph node, negative for carcinoma (0/1)   F. LYMPH NODE, LEFT AXILLARY, SENTINEL, EXCISION:  - Lymph node, negative for carcinoma (0/1)   G. LYMPH NODE, LEFT AXILLARY, SENTINEL, EXCISION:  - Lymph node, negative for carcinoma (0/1)    02/25/2021 Oncotype testing   Recurrence score of 4, predicts a risk of recurrence outside the breast over the next 9 years of 3%    02/25/2021 Cancer Staging   Staging form: Breast, AJCC 8th Edition - Pathologic stage from 02/25/2021: Stage IA (pT1b, pN0, cM0, G2, ER+, PR+, HER2-, Oncotype DX score: 3) - Signed by Malachy Mood, MD on 05/03/2021 Stage prefix: Initial diagnosis Multigene prognostic tests performed: Oncotype DX Recurrence score range: Less than 11 Histologic grading system: 3 grade system Residual tumor (R): R0 - None   04/03/2021 - 05/05/2021 Radiation Therapy    Site Technique Total Dose (Gy) Dose per Fx (Gy) Completed Fx Beam Energies  Breast, Left: Breast_L 3D 40.05/40.05 2.67 15/15 10X, 15X  Breast, Left: Breast_L_Bst 3D 10/10 2 5/5 6X, 10X     05/2021 -  Anti-estrogen oral therapy   Tamoxifen daily      INTERVAL HISTORY:  Tracy [name] is here for a follow up of left breast cancer. She was last seen by   NP Mardella Layman on  2/19/2024She presents to the clinic alone. Pt state that she is doing well and that she has lost weight, while participating in the Weight management clinic. Pt state that she has some hair thinning, she has no hot flashes while on Tamoxifen. Pt state that she gets a B12 injection monthly at her PCP office.     All other systems were reviewed with the patient and are negative.  MEDICAL HISTORY:  Past Medical History:  Diagnosis Date   ADHD    Alcohol abuse    Anemia    Anxiety    Anxiety and depression    Arthritis    left knee   Asthma    hardly uses inhaler   B12 deficiency    Cancer (HCC)    Constipation    Depression    Diverticulosis    Fatty liver 2012   pt states she was told this once   Headache    Migraines   Heart murmur    pt was told this, no echo   History of  stomach ulcers    Hypertension    history no longer requires medication management   IBS (irritable bowel syndrome)    IDA (iron deficiency anemia)    Infertility, female    Joint pain    Lactose intolerance    Melasma    Palpitations    Perforated ulcer (HCC)    Pre-diabetes    PUD (peptic ulcer disease)    Sleep apnea    mild, no CPAP ordered   SOBOE (shortness of breath on exertion)    Vitamin D deficiency     SURGICAL HISTORY: Past Surgical History:  Procedure Laterality Date   ABDOMINAL HYSTERECTOMY     ANKLE RECONSTRUCTION Left 09/09/2018   Procedure: BROSTRUM-GOULD LEFT;  Surgeon: Gwyneth Revels, DPM;  Location: [facility];  Service: Podiatry;  Laterality: Left;   BREAST LUMPECTOMY WITH RADIOACTIVE SEED AND SENTINEL  LYMPH NODE BIOPSY Left 02/25/2021   Procedure: LEFT BREAST LUMPECTOMY WITH RADIOACTIVE SEED AND SENTINEL LYMPH NODE BIOPSY;  Surgeon: Harriette Bouillon, MD;  Location: [facility];  Service: General;  Laterality: Left;   CHOLECYSTECTOMY     COLONOSCOPY     CYSTOSCOPY N/A 08/05/2016   Procedure: CYSTOSCOPY;  Surgeon: Carrington Clamp, MD;  Location: [facility];  Service: Gynecology;  Laterality: N/A;   ENDOMETRIAL ABLATION W/ NOVASURE     GASTRIC BYPASS OPEN     LYSIS OF ADHESION N/A 08/05/2016   Procedure: LYSIS OF ADHESION;  Surgeon: Carrington Clamp, MD;  Location: [facility];  Service: Gynecology;  Laterality: N/A;   perforated ulcer surgery     ROBOTIC ASSISTED SALPINGO OOPHERECTOMY Right 05/12/2017   Procedure: XI ROBOTIC ASSISTED RIGHT OOPHORECTOMY, Fulgeration of Endometriosis, Peritoneal Biopsy;  Surgeon: Carrington Clamp, MD;  Location: [facility];  Service: Gynecology;  Laterality: Right;   ROBOTIC ASSISTED TOTAL HYSTERECTOMY WITH SALPINGECTOMY Bilateral 08/05/2016   Procedure: ROBOTIC ASSISTED TOTAL HYSTERECTOMY WITH SALPINGECTOMY;  Surgeon: Carrington Clamp, MD;  Location: [facility];  Service: Gynecology;  Laterality: Bilateral;   TENDON REPAIR Left 09/09/2018   Procedure: TENOLYSIS, MULTIPLE;  Surgeon: Gwyneth Revels, DPM;  Location: [facility];  Service: Podiatry;  Laterality: Left;   TONSILLECTOMY     UPPER GI ENDOSCOPY      I have reviewed the social history and family history with the patient and they are unchanged from previous note.  ALLERGIES:  is allergic to ibuprofen, morphine, doxepin, nsaids, tramadol, and trazodone.  MEDICATIONS:  Current Outpatient Medications  Medication Sig Dispense Refill   albuterol (PROVENTIL) (2.5 MG/3ML) 0.083% nebulizer solution Take 2.5 mg by nebulization every 6 (six) hours as needed for wheezing or shortness of breath.     albuterol (VENTOLIN HFA) 108 (90 Base) MCG/ACT inhaler Inhale 2 puffs into the lungs every 6 (six) hours as needed for wheezing or  shortness of breath.     ALPRAZolam (XANAX) 0.5 MG tablet Take 0.5 tablets by mouth 2 (two) times daily as needed for anxiety.   0   amphetamine-dextroamphetamine (ADDERALL) 10 MG tablet Take 10 mg by mouth 2 (two) times daily.     azelastine (ASTELIN) 0.1 % nasal spray Place 1 spray into both nostrils daily as needed for allergies or rhinitis.  12   cetirizine (ZYRTEC) 10 MG tablet Take 10 mg by mouth daily as needed (allergies).     Cyanocobalamin 1000 MCG/ML KIT Inject 1,000 mcg as directed every 30 (thirty) days.     desvenlafaxine (PRISTIQ) 50 MG 24 hr tablet Take 50 mg by mouth daily.     gabapentin (NEURONTIN)  100 MG capsule Take 1 capsule (100 mg total) by mouth at bedtime. OK to increase dose to 3 capsules at night in 2-3 weeks if needed for hot flushes 90 capsule 1   hydrochlorothiazide (MICROZIDE) 12.5 MG capsule Take 12.5 mg by mouth daily.     lamoTRIgine (LAMICTAL) 100 MG tablet Take 200 mg by mouth 2 (two) times daily.     metFORMIN (GLUCOPHAGE) 500 MG tablet Take 1 tablet (500 mg total) by mouth every morning. 30 tablet 0   metFORMIN (GLUCOPHAGE) 500 MG tablet Take 1 tablet (500 mg total) by mouth 2 (two) times daily with a meal. 180 tablet 3   SPRAVATO, 84 MG DOSE, 28 MG/DEVICE SOPK Place into both nostrils.     tamoxifen (NOLVADEX) 20 MG tablet TAKE 1 TABLET(20 MG) BY MOUTH DAILY 90 tablet 3   topiramate (TOPAMAX) 50 MG tablet Take 1 tablet (50 mg total) by mouth 3 (three) times daily. 90 tablet 0   valACYclovir (VALTREX) 1000 MG tablet Take 1,000 mg by mouth daily as needed (cold sores).      Vitamin D, Ergocalciferol, (DRISDOL) 1.25 MG (50000 UNIT) CAPS capsule Take 1 capsule (50,000 Units total) by mouth every 7 (seven) days. 4 capsule 0   zolpidem (AMBIEN CR) 12.5 MG CR tablet Take 1 tablet (12.5 mg total) by mouth at bedtime. 30 tablet 3   No current facility-administered medications for this visit.    PHYSICAL EXAMINATION: ECOG PERFORMANCE STATUS: 0 -  Asymptomatic  Vitals:   11/02/22 1403  BP: 115/71  Pulse: 69  Resp: 18  Temp: 98.7 F (37.1 C)  SpO2: 98%   Wt Readings from Last 3 Encounters:  11/02/22 279 lb 1.6 oz (126.6 kg)  10/20/22 275 lb (124.7 kg)  09/30/22 280 lb (127 kg)     GENERAL:alert, no distress and comfortable SKIN: skin color normal, no rashes or significant lesions EYES: normal, Conjunctiva are pink and non-injected, sclera clear  NEURO: alert & oriented x 3 with fluent speech NECK: (-)supple, thyroid normal size, non-tender, without nodularity LYMPH: (-) no palpable lymphadenopathy in the cervical, axillary  ABDOMEN:(-)abdomen soft, (-)non-tender and (-)normal bowel sounds Breast: RT breast no palpable mass , LT breast  some scar tissue no palpable mass breast exam benign  LABORATORY DATA:  I have reviewed the data as listed    Latest Ref Rng & Units 11/02/2022    1:41 PM 11/19/2021   11:59 AM 11/07/2021   10:51 AM  CBC  WBC 4.0 - 10.5 K/uL 6.9  4.6  6.8   Hemoglobin 12.0 - 15.0 g/dL 72.5  36.6  44.0   Hematocrit 36.0 - 46.0 % 42.3  35.8  34.8   Platelets 150 - 400 K/uL 256   301         Latest Ref Rng & Units 11/02/2022    1:41 PM 11/19/2021   11:59 AM 11/07/2021   10:51 AM  CMP  Glucose 70 - 99 mg/dL 99  79  347   BUN 6 - 20 mg/dL 19  9  18    Creatinine 0.44 - 1.00 mg/dL 4.25  9.56  3.87   Sodium 135 - 145 mmol/L 141  144  142   Potassium 3.5 - 5.1 mmol/L 3.4  3.6  4.0   Chloride 98 - 111 mmol/L 104  103  105   CO2 22 - 32 mmol/L 29  25  32   Calcium 8.9 - 10.3 mg/dL 9.3  9.2  9.4   Total  Protein 6.5 - 8.1 g/dL 7.3  6.9  7.0   Total Bilirubin 0.3 - 1.2 mg/dL 0.4  <7.4  0.2   Alkaline Phos 38 - 126 U/L 52  98  81   AST 15 - 41 U/L 23  26  43   ALT 0 - 44 U/L 32  29  21       RADIOGRAPHIC STUDIES: I have personally reviewed the radiological images as listed and agreed with the findings in the report. No results found.    Orders Placed This Encounter  Procedures   MM 3D DIAGNOSTIC  MAMMOGRAM BILATERAL BREAST    Standing Status:   Future    Standing Expiration Date:   11/02/2023    Order Specific Question:   Reason for Exam (SYMPTOM  OR DIAGNOSIS REQUIRED)    Answer:   SCREENING    Order Specific Question:   Preferred imaging location?    Answer:   [facility]    Order Specific Question:   Is the patient pregnant?    Answer:   No   All questions were answered. The patient knows to call the clinic with any problems, questions or concerns. No barriers to learning was detected. The total time spent in the appointment was 25 minutes.     Malachy Mood, MD 11/02/2022   Carolin Coy, CMA, am acting as scribe for Malachy Mood, MD.   I have reviewed the above documentation for accuracy and completeness, and I agree with the above.

## 2022-11-03 ENCOUNTER — Telehealth: Payer: Self-pay | Admitting: Hematology

## 2022-11-03 DIAGNOSIS — F331 Major depressive disorder, recurrent, moderate: Secondary | ICD-10-CM | POA: Diagnosis not present

## 2022-11-03 DIAGNOSIS — F900 Attention-deficit hyperactivity disorder, predominantly inattentive type: Secondary | ICD-10-CM | POA: Diagnosis not present

## 2022-11-03 DIAGNOSIS — F411 Generalized anxiety disorder: Secondary | ICD-10-CM | POA: Diagnosis not present

## 2022-11-10 DIAGNOSIS — F332 Major depressive disorder, recurrent severe without psychotic features: Secondary | ICD-10-CM | POA: Diagnosis not present

## 2022-11-11 ENCOUNTER — Ambulatory Visit: Payer: BC Managed Care – PPO | Admitting: Pulmonary Disease

## 2022-11-11 ENCOUNTER — Encounter: Payer: Self-pay | Admitting: Pulmonary Disease

## 2022-11-11 VITALS — BP 112/84 | HR 67 | Temp 97.2°F | Ht 68.0 in | Wt 273.0 lb

## 2022-11-11 DIAGNOSIS — F5102 Adjustment insomnia: Secondary | ICD-10-CM | POA: Diagnosis not present

## 2022-11-11 NOTE — Patient Instructions (Signed)
I will see you a year from now  Call with significant concerns  Ambien as needed to get enough sleep  Graded activity  Continue with your weight loss efforts  Call us with significant concerns

## 2022-11-11 NOTE — Progress Notes (Signed)
Tracy [name]    [id number]    1966-01-30  Primary Care Physician:Feng, Terrace [place], MD  Referring Physician: Malachy Mood, MD [address] [place],  [place] [address]  Chief complaint:   History of snoring Has had sleep study in the past showing mild obstructive sleep apnea  HPI:  Having more problems with insomnia  Ambien seems to be helping  Gets about 5 hours of sleep  She dug a stye during the day  Most recent sleep study shows mild obstructive sleep apnea A previous study and also showed mild sleep apnea  She continues to lose weight  Treated for breast cancer, follows up with oncology  Usually tries to go to bed between 930 and 10, falls asleep in about 45 minutes 3-4 awakenings Final wake up time about 7 AM  Admits to some dryness of her mouth in the morning Occasional headaches Occasional choking gasping episodes at night, night sweats  Never smoked No pets  Dad snored  Has a history of asthma  Outpatient Encounter Medications as of 11/11/2022  Medication Sig   albuterol (PROVENTIL) (2.5 MG/3ML) 0.083% nebulizer solution Take 2.5 mg by nebulization every 6 (six) hours as needed for wheezing or shortness of breath.   albuterol (VENTOLIN HFA) 108 (90 Base) MCG/ACT inhaler Inhale 2 puffs into the lungs every 6 (six) hours as needed for wheezing or shortness of breath.   ALPRAZolam (XANAX) 0.5 MG tablet Take 0.5 tablets by mouth 2 (two) times daily as needed for anxiety.    amphetamine-dextroamphetamine (ADDERALL) 10 MG tablet Take 10 mg by mouth 2 (two) times daily.   azelastine (ASTELIN) 0.1 % nasal spray Place 1 spray into both nostrils daily as needed for allergies or rhinitis.   cetirizine (ZYRTEC) 10 MG tablet Take 10 mg by mouth daily as needed (allergies).   Cyanocobalamin 1000 MCG/ML KIT Inject 1,000 mcg as directed every 30 (thirty) days.   desvenlafaxine (PRISTIQ) 50 MG 24 hr tablet Take 50 mg by mouth daily.   gabapentin (NEURONTIN) 100  MG capsule Take 1 capsule (100 mg total) by mouth at bedtime. OK to increase dose to 3 capsules at night in 2-3 weeks if needed for hot flushes   hydrochlorothiazide (MICROZIDE) 12.5 MG capsule Take 12.5 mg by mouth daily.   lamoTRIgine (LAMICTAL) 100 MG tablet Take 200 mg by mouth 2 (two) times daily.   metFORMIN (GLUCOPHAGE) 500 MG tablet Take 1 tablet (500 mg total) by mouth 2 (two) times daily with a meal.   SPRAVATO, 84 MG DOSE, 28 MG/DEVICE SOPK Place into both nostrils.   tamoxifen (NOLVADEX) 20 MG tablet TAKE 1 TABLET(20 MG) BY MOUTH DAILY   topiramate (TOPAMAX) 50 MG tablet Take 1 tablet (50 mg total) by mouth 3 (three) times daily.   valACYclovir (VALTREX) 1000 MG tablet Take 1,000 mg by mouth daily as needed (cold sores).    Vitamin D, Ergocalciferol, (DRISDOL) 1.25 MG (50000 UNIT) CAPS capsule Take 1 capsule (50,000 Units total) by mouth every 7 (seven) days.   zolpidem (AMBIEN CR) 12.5 MG CR tablet Take 1 tablet (12.5 mg total) by mouth at bedtime.   [DISCONTINUED] metFORMIN (GLUCOPHAGE) 500 MG tablet Take 1 tablet (500 mg total) by mouth every morning. (Patient not taking: Reported on 11/11/2022)   No facility-administered encounter medications on file as of 11/11/2022.    Allergies as of 11/11/2022 - Review Complete 11/11/2022  Allergen Reaction Noted   Ibuprofen Other (See Comments) 03/06/2016  Morphine Hives 03/30/2014   Doxepin Other (See Comments) 05/11/2016   Nsaids Other (See Comments) 01/29/2015   Tramadol Nausea Only 02/03/2021   Trazodone Other (See Comments) 05/11/2016    Past Medical History:  Diagnosis Date   ADHD    Alcohol abuse    Anemia    Anxiety    Anxiety and depression    Arthritis    left knee   Asthma    hardly uses inhaler   B12 deficiency    Cancer (HCC)    Constipation    Depression    Diverticulosis    Fatty liver 2012   pt states she was told this once   Headache    Migraines   Heart murmur    pt was told this, no echo   History  of stomach ulcers    Hypertension    history no longer requires medication management   IBS (irritable bowel syndrome)    IDA (iron deficiency anemia)    Infertility, female    Joint pain    Lactose intolerance    Melasma    Palpitations    Perforated ulcer (HCC)    Pre-diabetes    PUD (peptic ulcer disease)    Sleep apnea    mild, no CPAP ordered   SOBOE (shortness of breath on exertion)    Vitamin D deficiency     Past Surgical History:  Procedure Laterality Date   ABDOMINAL HYSTERECTOMY     ANKLE RECONSTRUCTION Left 09/09/2018   Procedure: BROSTRUM-GOULD LEFT;  Surgeon: Gwyneth Revels, DPM;  Location: [facility];  Service: Podiatry;  Laterality: Left;   BREAST LUMPECTOMY WITH RADIOACTIVE SEED AND SENTINEL LYMPH NODE BIOPSY Left 02/25/2021   Procedure: LEFT BREAST LUMPECTOMY WITH RADIOACTIVE SEED AND SENTINEL LYMPH NODE BIOPSY;  Surgeon: Harriette Bouillon, MD;  Location: [facility];  Service: General;  Laterality: Left;   CHOLECYSTECTOMY     COLONOSCOPY     CYSTOSCOPY N/A 08/05/2016   Procedure: CYSTOSCOPY;  Surgeon: Carrington Clamp, MD;  Location: [facility];  Service: Gynecology;  Laterality: N/A;   ENDOMETRIAL ABLATION W/ NOVASURE     GASTRIC BYPASS OPEN     LYSIS OF ADHESION N/A 08/05/2016   Procedure: LYSIS OF ADHESION;  Surgeon: Carrington Clamp, MD;  Location: [facility];  Service: Gynecology;  Laterality: N/A;   perforated ulcer surgery     ROBOTIC ASSISTED SALPINGO OOPHERECTOMY Right 05/12/2017   Procedure: XI ROBOTIC ASSISTED RIGHT OOPHORECTOMY, Fulgeration of Endometriosis, Peritoneal Biopsy;  Surgeon: Carrington Clamp, MD;  Location: [facility];  Service: Gynecology;  Laterality: Right;   ROBOTIC ASSISTED TOTAL HYSTERECTOMY WITH SALPINGECTOMY Bilateral 08/05/2016   Procedure: ROBOTIC ASSISTED TOTAL HYSTERECTOMY WITH SALPINGECTOMY;  Surgeon: Carrington Clamp, MD;  Location: [facility];  Service: Gynecology;  Laterality: Bilateral;   TENDON REPAIR Left 09/09/2018   Procedure:  TENOLYSIS, MULTIPLE;  Surgeon: Gwyneth Revels, DPM;  Location: [facility];  Service: Podiatry;  Laterality: Left;   TONSILLECTOMY     UPPER GI ENDOSCOPY      Family History  Problem Relation Age of Onset   Diabetes Mother    COPD Mother    Anxiety disorder Mother    Depression Mother    Diabetes type II Mother    Obesity Mother    Crohn's disease Mother    Bipolar disorder Mother    Coronary artery disease Father    Diabetes type II Father    Hypertension Father    Pancreatic cancer Sister 40  died   Obesity Sister    Breast cancer Maternal Aunt 51   Depression Maternal Grandmother    Obesity Maternal Grandmother    Cancer Maternal Grandmother    Coronary artery disease Maternal Grandmother    Glaucoma Paternal Grandmother    Hypertension Paternal Grandmother    Osteoarthritis Paternal Grandmother    Skin cancer Paternal Grandmother    Coronary artery disease Paternal Grandfather    Leukemia Paternal Grandfather     Social History   Socioeconomic History   Marital status: Married    Spouse name: Not on file   Number of children: Not on file   Years of education: Not on file   Highest education level: Not on file  Occupational History   Not on file  Tobacco Use   Smoking status: Never   Smokeless tobacco: Never  Vaping Use   Vaping status: Never Used  Substance and Sexual Activity   Alcohol use: Yes    Alcohol/week: 4.0 standard drinks of alcohol    Types: 4 Glasses of wine per week   Drug use: No   Sexual activity: Never  Other Topics Concern   Not on file  Social History Narrative   Not on file   Social Determinants of Health   Financial Resource Strain: Patient Declined (05/25/2022)   Received from [facility], [facility]   Overall Financial Resource Strain (CARDIA)    Difficulty of Paying Living Expenses: Patient declined  Food Insecurity: Patient Declined (05/25/2022)   [facility] from [facility], [facility]   Hunger Vital Sign    Worried About Running Out of Food in the Last Year: Patient declined    Ran Out of Food in the Last Year: Patient declined  Transportation Needs: No Transportation Needs (05/25/2022)   Received from [facility], [facility]   PRAPARE - Transportation    In the past 12 months, has lack of transportation kept you from medical appointments or from getting medications?: No    Lack of Transportation (Non-Medical): No  Physical Activity: Not on file  Stress: Not on file  Social Connections: Unknown (07/29/2021)   Received from [facility]   Social Network    Social Network: Not on file  Intimate Partner Violence: Unknown (06/20/2021)   Received from [facility]   HITS    Physically Hurt: Not on file    Insult or Talk Down To: Not on file    Threaten Physical Harm: Not on file    Scream or Curse: Not on file    Review of Systems  Respiratory:  Positive for apnea.   Psychiatric/Behavioral:  Positive for sleep disturbance.     Vitals:   11/11/22 1040  BP: 112/84  Pulse: 67  Temp: (!) 97.2 F (36.2 C)  SpO2: 97%     Physical Exam Constitutional:      Appearance: She is obese.  HENT:     Head: Normocephalic.     Nose: Nose normal.     Mouth/Throat:     Comments: Mallampati 4, crowded oropharynx Eyes:     General: No scleral icterus. Cardiovascular:     Rate and Rhythm: Normal rate and regular rhythm.     Heart sounds: No murmur heard.    No friction rub.  Pulmonary:     Effort: No respiratory distress.     Breath sounds: No stridor. No wheezing or rhonchi.  Musculoskeletal:  Cervical back: Normal range of motion. No rigidity or tenderness.  Neurological:     Mental Status: She is alert.  Psychiatric:        Mood and Affect: Mood normal.       09/04/2021   10:00 AM  Results of the Epworth flowsheet  Sitting and reading 1  Watching TV 1  Sitting, inactive in a  public place (e.g. a theatre or a meeting) 0  As a passenger in a car for an hour without a break 0  Lying down to rest in the afternoon when circumstances permit 0  Sitting and talking to someone 0  Sitting quietly after a lunch without alcohol 0  In a car, while stopped for a few minutes in traffic 0  Total score 2   Data Reviewed: Previous sleep study not available for review  Assessment:  History of asthma -Albuterol as needed  History of insomnia -Continue Ambien  Mild obstructive sleep apnea -Continue weight loss efforts  History of breast cancer -Follows with oncology  Plan/Recommendations: Continue weight loss efforts  Graded activity as tolerated  Continue Ambien  Follow-up a year from now    Virl Diamond MD [facility] 11/11/2022, 10:51 AM  CC: Malachy Mood, MD

## 2022-11-14 ENCOUNTER — Ambulatory Visit
Admission: RE | Admit: 2022-11-14 | Discharge: 2022-11-14 | Disposition: A | Discharge status: Home / Self Care | Payer: BC Managed Care – PPO | Source: Ambulatory Visit | Attending: Orthopedic Surgery | Admitting: Orthopedic Surgery

## 2022-11-14 DIAGNOSIS — M4312 Spondylolisthesis, cervical region: Secondary | ICD-10-CM | POA: Diagnosis not present

## 2022-11-14 DIAGNOSIS — M542 Cervicalgia: Secondary | ICD-10-CM | POA: Diagnosis not present

## 2022-11-14 DIAGNOSIS — M4802 Spinal stenosis, cervical region: Secondary | ICD-10-CM

## 2022-11-17 DIAGNOSIS — F900 Attention-deficit hyperactivity disorder, predominantly inattentive type: Secondary | ICD-10-CM | POA: Diagnosis not present

## 2022-11-17 DIAGNOSIS — F331 Major depressive disorder, recurrent, moderate: Secondary | ICD-10-CM | POA: Diagnosis not present

## 2022-11-17 DIAGNOSIS — F411 Generalized anxiety disorder: Secondary | ICD-10-CM | POA: Diagnosis not present

## 2022-11-18 ENCOUNTER — Encounter (INDEPENDENT_AMBULATORY_CARE_PROVIDER_SITE_OTHER): Payer: Self-pay | Admitting: Family Medicine

## 2022-11-18 ENCOUNTER — Ambulatory Visit (INDEPENDENT_AMBULATORY_CARE_PROVIDER_SITE_OTHER): Payer: BC Managed Care – PPO | Admitting: Family Medicine

## 2022-11-18 VITALS — BP 114/73 | HR 71 | Temp 98.1°F | Ht 68.0 in | Wt 272.0 lb

## 2022-11-18 DIAGNOSIS — F3289 Other specified depressive episodes: Secondary | ICD-10-CM

## 2022-11-18 DIAGNOSIS — E669 Obesity, unspecified: Secondary | ICD-10-CM | POA: Diagnosis not present

## 2022-11-18 DIAGNOSIS — Z6841 Body Mass Index (BMI) 40.0 and over, adult: Secondary | ICD-10-CM

## 2022-11-18 DIAGNOSIS — R7303 Prediabetes: Secondary | ICD-10-CM

## 2022-11-18 MED ORDER — METFORMIN HCL 500 MG PO TABS
500.0000 mg | ORAL_TABLET | Freq: Two times a day (BID) | ORAL | 0 refills | Status: DC
Start: 2022-11-18 — End: 2023-01-06

## 2022-11-18 MED ORDER — ZEPBOUND 2.5 MG/0.5ML ~~LOC~~ SOAJ
2.5000 mg | SUBCUTANEOUS | 0 refills | Status: DC
Start: 2022-11-18 — End: 2023-05-10

## 2022-11-18 MED ORDER — TOPIRAMATE 50 MG PO TABS
50.0000 mg | ORAL_TABLET | Freq: Three times a day (TID) | ORAL | 0 refills | Status: DC
Start: 2022-11-18 — End: 2023-01-06

## 2022-11-18 NOTE — Progress Notes (Unsigned)
Chief Complaint:   OBESITY Tracy [name] is here to discuss her progress with her obesity treatment plan along with follow-up of her obesity related diagnoses. Tracy [name] is on keeping a food journal and adhering to recommended goals of 1400-1500 calories and 100+ grams of protein and states she is following her eating plan approximately 50% of the time. Tracy [name] states she is walking for 30 minutes 7 times per week.  Today's visit was #: 13 Starting weight: 305 lbs Starting date: 11/19/2021 Today's weight: 272 lbs Today's date: 11/18/2022 Total lbs lost to date: 33 Total lbs lost since last in-office visit: 3  Interim History: Patient continues to do well with her weight loss.  She notes increased hunger recently although she notes she is doing better.  Subjective:   1. Pre-diabetes Patient is on metformin, but she still notes polyphagia.  She denies nausea or vomiting.  2. Emotional Eating Behavior Patient is on Topamax and she is working on decreasing emotional eating behavior.  No side effects were noted.  Assessment/Plan:   1. Pre-diabetes Patient will continue metformin 500 mg twice daily, and we will refill for 90 days.  She will work on increasing her protein and decreasing simple carbohydrates.  - metFORMIN (GLUCOPHAGE) 500 MG tablet; Take 1 tablet (500 mg total) by mouth 2 (two) times daily with a meal.  Dispense: 180 tablet; Refill: 0  2. Emotional Eating Behavior Patient will continue Topamax 50 mg 3 times daily, and we will refill for 1 month.  - topiramate (TOPAMAX) 50 MG tablet; Take 1 tablet (50 mg total) by mouth 3 (three) times daily.  Dispense: 90 tablet; Refill: 0  3. BMI 45.0-49.9, adult (HCC)  4. Obesity, Beginning BMI 50.59 Tracy [name] is currently in the action stage of change. As such, her goal is to continue with weight loss efforts. She has agreed to keeping a food journal and adhering to recommended goals of 1400-1500 calories and 100+ grams of protein daily.   Patient  agreed to start Zepbound 2.5 mg once weekly #2 mL with no refills.   Exercise goals: As is.   Behavioral modification strategies: increasing lean protein intake.  Tracy [name] has agreed to follow-up with our clinic in 3 to 4 weeks. She was informed of the importance of frequent follow-up visits to maximize her success with intensive lifestyle modifications for her multiple health conditions.   Objective:   Blood pressure 114/73, pulse 71, temperature 98.1 F (36.7 C), height 5\' 8"  (1.727 m), weight 272 lb (123.4 kg), last menstrual period 07/20/2008, SpO2 97%. Body mass index is 41.36 kg/m.  Lab Results  Component Value Date   CREATININE 0.84 11/02/2022   BUN 19 11/02/2022   NA 141 11/02/2022   K 3.4 (L) 11/02/2022   CL 104 11/02/2022   CO2 29 11/02/2022   Lab Results  Component Value Date   ALT 32 11/02/2022   AST 23 11/02/2022   ALKPHOS 52 11/02/2022   BILITOT 0.4 11/02/2022   Lab Results  Component Value Date   HGBA1C 5.6 11/19/2021   Lab Results  Component Value Date   INSULIN 5.6 11/19/2021   Lab Results  Component Value Date   TSH 1.090 11/19/2021   Lab Results  Component Value Date   CHOL 193 11/19/2021   HDL 107 11/19/2021   LDLCALC 73 11/19/2021   TRIG 73 11/19/2021   Lab Results  Component Value Date   VD25OH 12.6 (L) 11/19/2021   Lab Results  Component Value Date  WBC 6.9 11/02/2022   HGB 13.9 11/02/2022   HCT 42.3 11/02/2022   MCV 89.2 11/02/2022   PLT 256 11/02/2022   Lab Results  Component Value Date   IRON 123 11/02/2022   TIBC 378 11/02/2022   FERRITIN 37 11/02/2022   Attestation Statements:   Reviewed by clinician on day of visit: allergies, medications, problem list, medical history, surgical history, family history, social history, and previous encounter notes.   I, Burt Knack, am acting as transcriptionist for Quillian Quince, MD.  I have reviewed the above documentation for accuracy and completeness, and I agree with the  above. -  Quillian Quince, MD

## 2022-11-19 ENCOUNTER — Encounter (INDEPENDENT_AMBULATORY_CARE_PROVIDER_SITE_OTHER): Payer: Self-pay | Admitting: Family Medicine

## 2022-11-19 ENCOUNTER — Telehealth (INDEPENDENT_AMBULATORY_CARE_PROVIDER_SITE_OTHER): Payer: Self-pay | Admitting: *Deleted

## 2022-11-19 NOTE — Telephone Encounter (Signed)
Sallyanne Kuster (Key: [id number])  form thumbnail Valinda Hoar [place] is processing your PA request and will respond shortly with next steps. You may close this dialog, return to your dashboard, and perform other tasks. To check for an update later, open this request again from your dashboard.  If you need assistance, please chat with CoverMyMeds or call us at [phone]

## 2022-11-19 NOTE — Telephone Encounter (Signed)
Sallyanne Kuster (Key: [id number])  Valinda Hoar [place] has not yet replied to your PA request. You may close this dialog, return to your dashboard, and perform other tasks.  To check for an update later, open this request again from your dashboard.  If [employer] [place] has not replied to your request within 24 hours for an urgent request or 72 hours for a standard request, please contact [employer] [place] at [phone] option 3, then option 3 again.

## 2022-11-25 ENCOUNTER — Encounter (INDEPENDENT_AMBULATORY_CARE_PROVIDER_SITE_OTHER): Payer: Self-pay

## 2022-12-01 DIAGNOSIS — R7989 Other specified abnormal findings of blood chemistry: Secondary | ICD-10-CM | POA: Diagnosis not present

## 2022-12-03 DIAGNOSIS — F331 Major depressive disorder, recurrent, moderate: Secondary | ICD-10-CM | POA: Diagnosis not present

## 2022-12-03 DIAGNOSIS — F900 Attention-deficit hyperactivity disorder, predominantly inattentive type: Secondary | ICD-10-CM | POA: Diagnosis not present

## 2022-12-03 DIAGNOSIS — F411 Generalized anxiety disorder: Secondary | ICD-10-CM | POA: Diagnosis not present

## 2022-12-07 DIAGNOSIS — M5412 Radiculopathy, cervical region: Secondary | ICD-10-CM | POA: Diagnosis not present

## 2022-12-07 DIAGNOSIS — M170 Bilateral primary osteoarthritis of knee: Secondary | ICD-10-CM | POA: Diagnosis not present

## 2022-12-07 DIAGNOSIS — Z6841 Body Mass Index (BMI) 40.0 and over, adult: Secondary | ICD-10-CM | POA: Diagnosis not present

## 2022-12-08 DIAGNOSIS — M7918 Myalgia, other site: Secondary | ICD-10-CM | POA: Diagnosis not present

## 2022-12-08 DIAGNOSIS — F332 Major depressive disorder, recurrent severe without psychotic features: Secondary | ICD-10-CM | POA: Diagnosis not present

## 2022-12-08 DIAGNOSIS — G894 Chronic pain syndrome: Secondary | ICD-10-CM | POA: Diagnosis not present

## 2022-12-08 DIAGNOSIS — M47812 Spondylosis without myelopathy or radiculopathy, cervical region: Secondary | ICD-10-CM | POA: Diagnosis not present

## 2022-12-08 DIAGNOSIS — Z79891 Long term (current) use of opiate analgesic: Secondary | ICD-10-CM | POA: Diagnosis not present

## 2022-12-11 DIAGNOSIS — M4802 Spinal stenosis, cervical region: Secondary | ICD-10-CM | POA: Diagnosis not present

## 2022-12-11 DIAGNOSIS — M5412 Radiculopathy, cervical region: Secondary | ICD-10-CM | POA: Diagnosis not present

## 2022-12-15 DIAGNOSIS — Q458 Other specified congenital malformations of digestive system: Secondary | ICD-10-CM | POA: Diagnosis not present

## 2022-12-15 DIAGNOSIS — L719 Rosacea, unspecified: Secondary | ICD-10-CM | POA: Diagnosis not present

## 2022-12-15 DIAGNOSIS — R110 Nausea: Secondary | ICD-10-CM | POA: Diagnosis not present

## 2022-12-16 ENCOUNTER — Encounter (INDEPENDENT_AMBULATORY_CARE_PROVIDER_SITE_OTHER): Payer: Self-pay

## 2022-12-16 ENCOUNTER — Ambulatory Visit (INDEPENDENT_AMBULATORY_CARE_PROVIDER_SITE_OTHER): Payer: BC Managed Care – PPO | Admitting: Family Medicine

## 2023-01-04 DIAGNOSIS — M4802 Spinal stenosis, cervical region: Secondary | ICD-10-CM | POA: Diagnosis not present

## 2023-01-04 DIAGNOSIS — M5412 Radiculopathy, cervical region: Secondary | ICD-10-CM | POA: Diagnosis not present

## 2023-01-05 DIAGNOSIS — F411 Generalized anxiety disorder: Secondary | ICD-10-CM | POA: Diagnosis not present

## 2023-01-05 DIAGNOSIS — F331 Major depressive disorder, recurrent, moderate: Secondary | ICD-10-CM | POA: Diagnosis not present

## 2023-01-05 DIAGNOSIS — F900 Attention-deficit hyperactivity disorder, predominantly inattentive type: Secondary | ICD-10-CM | POA: Diagnosis not present

## 2023-01-06 ENCOUNTER — Ambulatory Visit (INDEPENDENT_AMBULATORY_CARE_PROVIDER_SITE_OTHER): Payer: BC Managed Care – PPO | Admitting: Family Medicine

## 2023-01-06 ENCOUNTER — Encounter (INDEPENDENT_AMBULATORY_CARE_PROVIDER_SITE_OTHER): Payer: Self-pay | Admitting: Family Medicine

## 2023-01-06 VITALS — BP 117/72 | HR 76 | Temp 98.2°F | Ht 68.0 in | Wt 261.0 lb

## 2023-01-06 DIAGNOSIS — E559 Vitamin D deficiency, unspecified: Secondary | ICD-10-CM

## 2023-01-06 DIAGNOSIS — R7303 Prediabetes: Secondary | ICD-10-CM

## 2023-01-06 DIAGNOSIS — F5089 Other specified eating disorder: Secondary | ICD-10-CM | POA: Diagnosis not present

## 2023-01-06 DIAGNOSIS — F3289 Other specified depressive episodes: Secondary | ICD-10-CM

## 2023-01-06 DIAGNOSIS — E669 Obesity, unspecified: Secondary | ICD-10-CM | POA: Diagnosis not present

## 2023-01-06 DIAGNOSIS — Z6839 Body mass index (BMI) 39.0-39.9, adult: Secondary | ICD-10-CM

## 2023-01-06 MED ORDER — VITAMIN D (ERGOCALCIFEROL) 1.25 MG (50000 UNIT) PO CAPS
50000.0000 [IU] | ORAL_CAPSULE | ORAL | 0 refills | Status: DC
Start: 2023-01-06 — End: 2023-03-04

## 2023-01-06 MED ORDER — TOPIRAMATE 50 MG PO TABS
50.0000 mg | ORAL_TABLET | Freq: Three times a day (TID) | ORAL | 0 refills | Status: DC
Start: 2023-01-06 — End: 2023-03-04

## 2023-01-06 MED ORDER — METFORMIN HCL 500 MG PO TABS
500.0000 mg | ORAL_TABLET | Freq: Two times a day (BID) | ORAL | 0 refills | Status: DC
Start: 2023-01-06 — End: 2023-03-04

## 2023-01-06 NOTE — Progress Notes (Signed)
.sm[facility]  Office: [phone]  /  Fax: [phone]  WEIGHT SUMMARY AND BIOMETRICS  Anthropometric Measurements Height: 5\' 8"  (1.727 m) Weight: 261 lb (118.4 kg) BMI (Calculated): 39.69 Weight at Last Visit: 272 lb Weight Lost Since Last Visit: 11 lb Weight Gained Since Last Visit: 0 Starting Weight: 305 lb Total Weight Loss (lbs): 44 lb (20 kg) Peak Weight: 360 lb   Body Composition  Body Fat %: 51.4 % Fat Mass (lbs): 134.6 lbs Muscle Mass (lbs): 120.8 lbs Total Body Water (lbs): 89.4 lbs Visceral Fat Rating : 16   Other Clinical Data Fasting: no Labs: no Today's Visit #: 14 Starting Date: 11/19/21    Chief Complaint: OBESITY  History of Present Illness   The patient, diagnosed with obesity, vitamin D deficiency, prediabetes, and depression with emotional eating behaviors, presents for a follow-up visit. She has been on Zepbound for prediabetes, topiramate for depression with emotional eating behaviors, and prescription vitamin D for vitamin D deficiency. Over the past seven weeks, she has lost eleven pounds and has been maintaining a food journal with a goal of 1400-1500 calories and 100 or more grams of protein per day. She reports adherence to this diet approximately 60% of the time. For exercise, she has been walking for 30-45 minutes daily.  The patient recently returned from a trip to [place], during which she walked six to seven miles daily. Despite healthy eating habits during the trip, she noticed a four-pound weight gain upon return, which she attributes to fluid retention from prolonged sitting during travel.  Prior to the trip, the patient received a neck injection for severe neck pain and headaches, which she describes as so severe it felt like a sinus infection. The pain eased up during the trip but has not completely resolved. She also reports experiencing phantom smells, for which a brain scan has been recommended.  The patient is also dealing with a bulging  disc and pinched nerve in the neck. She expresses a desire to avoid long-term medication use for pain management.  The patient has been taking metformin and topiramate, and is unsure if she needs refills. She reports that her insurance does not cover Zepbound. She is not currently taking phentermine. She has set a personal weight loss goal of eight pounds per month.          PHYSICAL EXAM:  Blood pressure 117/72, pulse 76, temperature 98.2 F (36.8 C), height 5\' 8"  (1.727 m), weight 261 lb (118.4 kg), last menstrual period 07/20/2008, SpO2 94%. Body mass index is 39.68 kg/m.  DIAGNOSTIC DATA REVIEWED:  BMET    Component Value Date/Time   NA 141 11/02/2022 1341   NA 144 11/19/2021 1159   NA 141 09/26/2013 1414   K 3.4 (L) 11/02/2022 1341   K 3.2 (L) 09/26/2013 1414   CL 104 11/02/2022 1341   CL 102 09/26/2013 1414   CO2 29 11/02/2022 1341   CO2 31 09/26/2013 1414   GLUCOSE 99 11/02/2022 1341   GLUCOSE 85 09/26/2013 1414   BUN 19 11/02/2022 1341   BUN 9 11/19/2021 1159   BUN 10 09/26/2013 1414   CREATININE 0.84 11/02/2022 1341   CREATININE 0.71 09/26/2013 1414   CALCIUM 9.3 11/02/2022 1341   CALCIUM 8.9 09/26/2013 1414   GFRNONAA >60 11/02/2022 1341   GFRNONAA >60 09/26/2013 1414   GFRAA >60 05/06/2017 1117   GFRAA >60 09/26/2013 1414   Lab Results  Component Value Date   HGBA1C 5.6 11/19/2021   Lab Results  Component Value Date   INSULIN 5.6 11/19/2021   Lab Results  Component Value Date   TSH 1.090 11/19/2021   CBC    Component Value Date/Time   WBC 6.9 11/02/2022 1341   WBC 6.8 11/07/2021 1051   RBC 4.74 11/02/2022 1341   HGB 13.9 11/02/2022 1341   HGB 11.0 (L) 11/19/2021 1159   HCT 42.3 11/02/2022 1341   HCT 35.8 11/19/2021 1159   PLT 256 11/02/2022 1341   PLT 338 12/05/2013 0938   MCV 89.2 11/02/2022 1341   MCV 77 (L) 11/19/2021 1159   MCV 72 (L) 09/26/2013 1414   MCH 29.3 11/02/2022 1341   MCHC 32.9 11/02/2022 1341   RDW 13.7 11/02/2022 1341    RDW 16.1 (H) 11/19/2021 1159   RDW 17.5 (H) 09/26/2013 1414   Iron Studies    Component Value Date/Time   IRON 123 11/02/2022 1341   TIBC 378 11/02/2022 1341   FERRITIN 37 11/02/2022 1341   FERRITIN 23 12/05/2013 0938   IRONPCTSAT 33 (H) 11/02/2022 1341   Lipid Panel     Component Value Date/Time   CHOL 193 11/19/2021 1159   TRIG 73 11/19/2021 1159   HDL 107 11/19/2021 1159   LDLCALC 73 11/19/2021 1159   Hepatic Function Panel     Component Value Date/Time   PROT 7.3 11/02/2022 1341   PROT 6.9 11/19/2021 1159   PROT 7.5 09/26/2013 1414   ALBUMIN 4.3 11/02/2022 1341   ALBUMIN 4.2 11/19/2021 1159   ALBUMIN 3.5 09/26/2013 1414   AST 23 11/02/2022 1341   ALT 32 11/02/2022 1341   ALT 27 09/26/2013 1414   ALKPHOS 52 11/02/2022 1341   ALKPHOS 72 09/26/2013 1414   BILITOT 0.4 11/02/2022 1341      Component Value Date/Time   TSH 1.090 11/19/2021 1159   Nutritional Lab Results  Component Value Date   VD25OH 12.6 (L) 11/19/2021     Assessment and Plan    Obesity and prediabetes Significant progress with weight loss (11 pounds in 7 weeks). Adherence to dietary plan and exercise regimen is approximately 60%. Noted sugar cravings. -Continue current diet and exercise regimen. -Continue Metformin for prediabetes and polyphagia.   Depression with Emotional Eating Currently managed with Topiramate. -Continue Topiramate.  Vitamin D Deficiency Currently managed with prescription Vitamin D. -Continue Vitamin D supplementation.   Follow-up -Schedule appointment for 02/04/2023 at 3:40pm to discuss strategies for Thanksgiving. -Schedule December appointment before Christmas.       She was informed of the importance of frequent follow up visits to maximize her success with intensive lifestyle modifications for her multiple health conditions.    Quillian Quince, MD

## 2023-01-11 DIAGNOSIS — S04819A Injury of olfactory [1st ] nerve, unspecified side, initial encounter: Secondary | ICD-10-CM | POA: Diagnosis not present

## 2023-01-11 DIAGNOSIS — R9082 White matter disease, unspecified: Secondary | ICD-10-CM | POA: Diagnosis not present

## 2023-01-12 DIAGNOSIS — R300 Dysuria: Secondary | ICD-10-CM | POA: Diagnosis not present

## 2023-01-12 DIAGNOSIS — Z01419 Encounter for gynecological examination (general) (routine) without abnormal findings: Secondary | ICD-10-CM | POA: Diagnosis not present

## 2023-01-13 DIAGNOSIS — M546 Pain in thoracic spine: Secondary | ICD-10-CM | POA: Diagnosis not present

## 2023-01-13 DIAGNOSIS — M542 Cervicalgia: Secondary | ICD-10-CM | POA: Diagnosis not present

## 2023-01-18 ENCOUNTER — Ambulatory Visit
Admission: RE | Admit: 2023-01-18 | Discharge: 2023-01-18 | Disposition: A | Discharge status: Home / Self Care | Payer: BC Managed Care – PPO | Source: Ambulatory Visit | Attending: Hematology | Admitting: Hematology

## 2023-01-18 DIAGNOSIS — Z853 Personal history of malignant neoplasm of breast: Secondary | ICD-10-CM | POA: Diagnosis not present

## 2023-01-18 DIAGNOSIS — Z170 Estrogen receptor positive status [ER+]: Secondary | ICD-10-CM

## 2023-01-19 DIAGNOSIS — M542 Cervicalgia: Secondary | ICD-10-CM | POA: Diagnosis not present

## 2023-01-19 DIAGNOSIS — M546 Pain in thoracic spine: Secondary | ICD-10-CM | POA: Diagnosis not present

## 2023-01-20 DIAGNOSIS — M542 Cervicalgia: Secondary | ICD-10-CM | POA: Diagnosis not present

## 2023-01-20 DIAGNOSIS — Z8659 Personal history of other mental and behavioral disorders: Secondary | ICD-10-CM | POA: Diagnosis not present

## 2023-01-20 DIAGNOSIS — R200 Anesthesia of skin: Secondary | ICD-10-CM | POA: Diagnosis not present

## 2023-01-20 DIAGNOSIS — G4452 New daily persistent headache (NDPH): Secondary | ICD-10-CM | POA: Diagnosis not present

## 2023-01-21 DIAGNOSIS — M546 Pain in thoracic spine: Secondary | ICD-10-CM | POA: Diagnosis not present

## 2023-01-21 DIAGNOSIS — M542 Cervicalgia: Secondary | ICD-10-CM | POA: Diagnosis not present

## 2023-01-26 DIAGNOSIS — F411 Generalized anxiety disorder: Secondary | ICD-10-CM | POA: Diagnosis not present

## 2023-01-26 DIAGNOSIS — F331 Major depressive disorder, recurrent, moderate: Secondary | ICD-10-CM | POA: Diagnosis not present

## 2023-01-26 DIAGNOSIS — F900 Attention-deficit hyperactivity disorder, predominantly inattentive type: Secondary | ICD-10-CM | POA: Diagnosis not present

## 2023-01-27 DIAGNOSIS — M170 Bilateral primary osteoarthritis of knee: Secondary | ICD-10-CM | POA: Diagnosis not present

## 2023-02-02 DIAGNOSIS — M546 Pain in thoracic spine: Secondary | ICD-10-CM | POA: Diagnosis not present

## 2023-02-02 DIAGNOSIS — M542 Cervicalgia: Secondary | ICD-10-CM | POA: Diagnosis not present

## 2023-02-02 DIAGNOSIS — M170 Bilateral primary osteoarthritis of knee: Secondary | ICD-10-CM | POA: Diagnosis not present

## 2023-02-04 ENCOUNTER — Encounter (INDEPENDENT_AMBULATORY_CARE_PROVIDER_SITE_OTHER): Payer: Self-pay

## 2023-02-04 ENCOUNTER — Ambulatory Visit (INDEPENDENT_AMBULATORY_CARE_PROVIDER_SITE_OTHER): Payer: BC Managed Care – PPO | Admitting: Family Medicine

## 2023-02-04 DIAGNOSIS — M542 Cervicalgia: Secondary | ICD-10-CM | POA: Diagnosis not present

## 2023-02-04 DIAGNOSIS — M546 Pain in thoracic spine: Secondary | ICD-10-CM | POA: Diagnosis not present

## 2023-02-05 ENCOUNTER — Other Ambulatory Visit (INDEPENDENT_AMBULATORY_CARE_PROVIDER_SITE_OTHER): Payer: Self-pay | Admitting: Family Medicine

## 2023-02-05 DIAGNOSIS — F3289 Other specified depressive episodes: Secondary | ICD-10-CM

## 2023-02-09 DIAGNOSIS — M542 Cervicalgia: Secondary | ICD-10-CM | POA: Diagnosis not present

## 2023-02-09 DIAGNOSIS — M546 Pain in thoracic spine: Secondary | ICD-10-CM | POA: Diagnosis not present

## 2023-02-10 DIAGNOSIS — M542 Cervicalgia: Secondary | ICD-10-CM | POA: Diagnosis not present

## 2023-02-10 DIAGNOSIS — M546 Pain in thoracic spine: Secondary | ICD-10-CM | POA: Diagnosis not present

## 2023-02-17 DIAGNOSIS — M542 Cervicalgia: Secondary | ICD-10-CM | POA: Diagnosis not present

## 2023-02-17 DIAGNOSIS — M546 Pain in thoracic spine: Secondary | ICD-10-CM | POA: Diagnosis not present

## 2023-02-19 DIAGNOSIS — M546 Pain in thoracic spine: Secondary | ICD-10-CM | POA: Diagnosis not present

## 2023-02-19 DIAGNOSIS — M542 Cervicalgia: Secondary | ICD-10-CM | POA: Diagnosis not present

## 2023-02-23 DIAGNOSIS — F331 Major depressive disorder, recurrent, moderate: Secondary | ICD-10-CM | POA: Diagnosis not present

## 2023-02-23 DIAGNOSIS — F900 Attention-deficit hyperactivity disorder, predominantly inattentive type: Secondary | ICD-10-CM | POA: Diagnosis not present

## 2023-02-23 DIAGNOSIS — F411 Generalized anxiety disorder: Secondary | ICD-10-CM | POA: Diagnosis not present

## 2023-02-25 DIAGNOSIS — M542 Cervicalgia: Secondary | ICD-10-CM | POA: Diagnosis not present

## 2023-02-25 DIAGNOSIS — M546 Pain in thoracic spine: Secondary | ICD-10-CM | POA: Diagnosis not present

## 2023-03-02 ENCOUNTER — Other Ambulatory Visit: Payer: Self-pay | Admitting: Family Medicine

## 2023-03-02 DIAGNOSIS — M4802 Spinal stenosis, cervical region: Secondary | ICD-10-CM | POA: Diagnosis not present

## 2023-03-02 DIAGNOSIS — M5412 Radiculopathy, cervical region: Secondary | ICD-10-CM | POA: Diagnosis not present

## 2023-03-03 DIAGNOSIS — M546 Pain in thoracic spine: Secondary | ICD-10-CM | POA: Diagnosis not present

## 2023-03-03 DIAGNOSIS — M542 Cervicalgia: Secondary | ICD-10-CM | POA: Diagnosis not present

## 2023-03-04 ENCOUNTER — Ambulatory Visit (INDEPENDENT_AMBULATORY_CARE_PROVIDER_SITE_OTHER): Payer: BC Managed Care – PPO | Admitting: Family Medicine

## 2023-03-04 ENCOUNTER — Encounter (INDEPENDENT_AMBULATORY_CARE_PROVIDER_SITE_OTHER): Payer: Self-pay | Admitting: Family Medicine

## 2023-03-04 VITALS — BP 111/73 | HR 74 | Temp 98.2°F | Ht 68.0 in | Wt 254.0 lb

## 2023-03-04 DIAGNOSIS — G43809 Other migraine, not intractable, without status migrainosus: Secondary | ICD-10-CM

## 2023-03-04 DIAGNOSIS — R7303 Prediabetes: Secondary | ICD-10-CM

## 2023-03-04 DIAGNOSIS — F3289 Other specified depressive episodes: Secondary | ICD-10-CM

## 2023-03-04 DIAGNOSIS — E559 Vitamin D deficiency, unspecified: Secondary | ICD-10-CM

## 2023-03-04 DIAGNOSIS — G43909 Migraine, unspecified, not intractable, without status migrainosus: Secondary | ICD-10-CM | POA: Diagnosis not present

## 2023-03-04 DIAGNOSIS — Z6838 Body mass index (BMI) 38.0-38.9, adult: Secondary | ICD-10-CM

## 2023-03-04 DIAGNOSIS — E669 Obesity, unspecified: Secondary | ICD-10-CM

## 2023-03-04 DIAGNOSIS — F5089 Other specified eating disorder: Secondary | ICD-10-CM | POA: Diagnosis not present

## 2023-03-04 MED ORDER — VITAMIN D (ERGOCALCIFEROL) 1.25 MG (50000 UNIT) PO CAPS: 50000.0000 [IU] | ORAL_CAPSULE | ORAL | 0 refills | Status: AC

## 2023-03-04 MED ORDER — TOPIRAMATE 50 MG PO TABS
50.0000 mg | ORAL_TABLET | Freq: Three times a day (TID) | ORAL | 0 refills | Status: DC
Start: 2023-03-04 — End: 2023-04-14

## 2023-03-04 MED ORDER — METFORMIN HCL 500 MG PO TABS: 500.0000 mg | ORAL_TABLET | Freq: Two times a day (BID) | ORAL | 0 refills | Status: DC

## 2023-03-04 NOTE — Progress Notes (Signed)
.smr  Office: [phone]  /  Fax: [phone]  WEIGHT SUMMARY AND BIOMETRICS  Anthropometric Measurements Height: 5\' 8"  (1.727 m) Weight: 254 lb (115.2 kg) BMI (Calculated): 38.63 Weight at Last Visit: 261 lb Weight Lost Since Last Visit: 7 lb Weight Gained Since Last Visit: 0 Starting Weight: 305 lb Total Weight Loss (lbs): 51 lb (23.1 kg) Peak Weight: 360 lb   Body Composition  Body Fat %: 52.8 % Fat Mass (lbs): 134.4 lbs Muscle Mass (lbs): 114.2 lbs Total Body Water (lbs): 92.2 lbs Visceral Fat Rating : 16   Other Clinical Data Fasting: No Labs: No Today's Visit #: 15 Starting Date: 11/19/21    Chief Complaint: OBESITY   History of Present Illness   The patient, with a history of prediabetes, vitamin D deficiency, emotional eating behaviors, and obesity, presents for a routine follow-up. She is currently on vitamin D 50,000 international units weekly, metformin, and Topamax, all of which she tolerates well and requests refills for. She has been actively working on diet, exercise, and weight loss to manage her prediabetes, with a recent weight loss of seven pounds in the last two months. She maintains a food journal with a goal of 1400-1500 calories and at least 100 grams of protein daily, and she exercises 30-60 minutes daily, primarily cardio. She estimates she meets these goals about 60% of the time.  In addition to these efforts, she has been dealing with migraines for several months. She has recently started Ajovy, approved by her insurance, and has noticed a decrease in the frequency and severity of her migraines. She is also undergoing physical therapy and is scheduled for an injection in her neck for neck pain. She is being managed by a neurologist, Dr. Clelia Croft, at [facility] for her migraines.  Despite her weight loss, she expresses difficulty in perceiving her progress, suggesting possible body dysmorphia. She reports that while she can see the difference  in old photos, she struggles to see the change in her day-to-day appearance. She is considering a lift procedure in the future.          PHYSICAL EXAM:  Blood pressure 111/73, pulse 74, temperature 98.2 F (36.8 C), height 5\' 8"  (1.727 m), weight 254 lb (115.2 kg), last menstrual period 07/20/2008, SpO2 100%. Body mass index is 38.62 kg/m.  DIAGNOSTIC DATA REVIEWED:  BMET    Component Value Date/Time   NA 141 11/02/2022 1341   NA 144 11/19/2021 1159   NA 141 09/26/2013 1414   K 3.4 (L) 11/02/2022 1341   K 3.2 (L) 09/26/2013 1414   CL 104 11/02/2022 1341   CL 102 09/26/2013 1414   CO2 29 11/02/2022 1341   CO2 31 09/26/2013 1414   GLUCOSE 99 11/02/2022 1341   GLUCOSE 85 09/26/2013 1414   BUN 19 11/02/2022 1341   BUN 9 11/19/2021 1159   BUN 10 09/26/2013 1414   CREATININE 0.84 11/02/2022 1341   CREATININE 0.71 09/26/2013 1414   CALCIUM 9.3 11/02/2022 1341   CALCIUM 8.9 09/26/2013 1414   GFRNONAA >60 11/02/2022 1341   GFRNONAA >60 09/26/2013 1414   GFRAA >60 05/06/2017 1117   GFRAA >60 09/26/2013 1414   Lab Results  Component Value Date   HGBA1C 5.6 11/19/2021   Lab Results  Component Value Date   INSULIN 5.6 11/19/2021   Lab Results  Component Value Date   TSH 1.090 11/19/2021   CBC    Component Value Date/Time   WBC 6.9 11/02/2022 1341   WBC  6.8 11/07/2021 1051   RBC 4.74 11/02/2022 1341   HGB 13.9 11/02/2022 1341   HGB 11.0 (L) 11/19/2021 1159   HCT 42.3 11/02/2022 1341   HCT 35.8 11/19/2021 1159   PLT 256 11/02/2022 1341   PLT 338 12/05/2013 0938   MCV 89.2 11/02/2022 1341   MCV 77 (L) 11/19/2021 1159   MCV 72 (L) 09/26/2013 1414   MCH 29.3 11/02/2022 1341   MCHC 32.9 11/02/2022 1341   RDW 13.7 11/02/2022 1341   RDW 16.1 (H) 11/19/2021 1159   RDW 17.5 (H) 09/26/2013 1414   Iron Studies    Component Value Date/Time   IRON 123 11/02/2022 1341   TIBC 378 11/02/2022 1341   FERRITIN 37 11/02/2022 1341   FERRITIN 23 12/05/2013 0938    IRONPCTSAT 33 (H) 11/02/2022 1341   Lipid Panel     Component Value Date/Time   CHOL 193 11/19/2021 1159   TRIG 73 11/19/2021 1159   HDL 107 11/19/2021 1159   LDLCALC 73 11/19/2021 1159   Hepatic Function Panel     Component Value Date/Time   PROT 7.3 11/02/2022 1341   PROT 6.9 11/19/2021 1159   PROT 7.5 09/26/2013 1414   ALBUMIN 4.3 11/02/2022 1341   ALBUMIN 4.2 11/19/2021 1159   ALBUMIN 3.5 09/26/2013 1414   AST 23 11/02/2022 1341   ALT 32 11/02/2022 1341   ALT 27 09/26/2013 1414   ALKPHOS 52 11/02/2022 1341   ALKPHOS 72 09/26/2013 1414   BILITOT 0.4 11/02/2022 1341      Component Value Date/Time   TSH 1.090 11/19/2021 1159   Nutritional Lab Results  Component Value Date   VD25OH 12.6 (L) 11/19/2021     Assessment and Plan    Migraines Migraines reduced to every other day with less severity. Currently on Ajovy, approved by insurance, and undergoing physical therapy. Discussed insurance coverage challenges and appeal process. - Continue Ajovy injections monthly - Continue physical therapy - Follow up with Dr. Clelia Croft for migraine management -Continue to work on weight loss to decrease inflammation  Prediabetes Working on diet, exercise, and weight loss. Currently on metformin with no issues. Keeping a food journal with a 1400-1500 calorie goal and at least 100 grams of protein, feeling successful about 60% of the time. Exercising mostly cardio, 30-60 minutes, seven times per week. - Refill metformin -Continue diet and exercise  Obesity Lost 7 pounds in the last two months. Exercising mostly cardio, 30-60 minutes, seven times per week. Discussed body dysmorphia and focusing on overall progress rather than daily fluctuations. - Continue current diet and exercise regimen  Emotional Eating Behaviors On topiramate. Lost 7 pounds in the last two months. Feels stuck at current weight but has made progress. - Refill topiramate -EEB discussed today  Vitamin D  Deficiency On prescription vitamin D 50,000 IU weekly and requests a refill. - Refill vitamin D 50,000 IU weekly  General Health Maintenance Complete set of labs done in August and recent mammogram. Due for updated labs. - Order fasting labs for next visit - Schedule next visit for fasting labs  Follow-up - Check with front desk for an earlier appointment time on December 16th - Schedule March appointment.        She was informed of the importance of frequent follow up visits to maximize her success with intensive lifestyle modifications for her multiple health conditions.    Quillian Quince, MD

## 2023-03-08 DIAGNOSIS — M542 Cervicalgia: Secondary | ICD-10-CM | POA: Diagnosis not present

## 2023-03-08 DIAGNOSIS — M546 Pain in thoracic spine: Secondary | ICD-10-CM | POA: Diagnosis not present

## 2023-03-18 DIAGNOSIS — M1711 Unilateral primary osteoarthritis, right knee: Secondary | ICD-10-CM | POA: Diagnosis not present

## 2023-03-18 DIAGNOSIS — M170 Bilateral primary osteoarthritis of knee: Secondary | ICD-10-CM | POA: Diagnosis not present

## 2023-03-18 DIAGNOSIS — M222X1 Patellofemoral disorders, right knee: Secondary | ICD-10-CM | POA: Diagnosis not present

## 2023-03-18 DIAGNOSIS — M1712 Unilateral primary osteoarthritis, left knee: Secondary | ICD-10-CM | POA: Diagnosis not present

## 2023-03-18 NOTE — Discharge Instructions (Signed)

## 2023-03-19 ENCOUNTER — Ambulatory Visit
Admission: RE | Admit: 2023-03-19 | Discharge: 2023-03-19 | Disposition: A | Discharge status: Home / Self Care | Payer: BC Managed Care – PPO | Source: Ambulatory Visit | Attending: Family Medicine | Admitting: Family Medicine

## 2023-03-19 DIAGNOSIS — M4722 Other spondylosis with radiculopathy, cervical region: Secondary | ICD-10-CM | POA: Diagnosis not present

## 2023-03-19 DIAGNOSIS — M4312 Spondylolisthesis, cervical region: Secondary | ICD-10-CM | POA: Diagnosis not present

## 2023-03-19 DIAGNOSIS — M4802 Spinal stenosis, cervical region: Secondary | ICD-10-CM | POA: Diagnosis not present

## 2023-03-19 DIAGNOSIS — M5412 Radiculopathy, cervical region: Secondary | ICD-10-CM

## 2023-03-19 MED ORDER — TRIAMCINOLONE ACETONIDE 40 MG/ML IJ SUSP (RADIOLOGY)
60.0000 mg | Freq: Once | INTRAMUSCULAR | Status: AC
  Administered 2023-03-19: 60 mg via EPIDURAL

## 2023-03-19 MED ORDER — IOPAMIDOL (ISOVUE-M 300) INJECTION 61%
1.0000 mL | Freq: Once | INTRAMUSCULAR | Status: AC | PRN
  Administered 2023-03-19: 1 mL via EPIDURAL

## 2023-03-26 DIAGNOSIS — F331 Major depressive disorder, recurrent, moderate: Secondary | ICD-10-CM | POA: Diagnosis not present

## 2023-03-26 DIAGNOSIS — F411 Generalized anxiety disorder: Secondary | ICD-10-CM | POA: Diagnosis not present

## 2023-03-26 DIAGNOSIS — F900 Attention-deficit hyperactivity disorder, predominantly inattentive type: Secondary | ICD-10-CM | POA: Diagnosis not present

## 2023-03-28 ENCOUNTER — Encounter: Payer: Self-pay | Admitting: Internal Medicine

## 2023-04-01 ENCOUNTER — Encounter (INDEPENDENT_AMBULATORY_CARE_PROVIDER_SITE_OTHER): Payer: Self-pay

## 2023-04-01 ENCOUNTER — Ambulatory Visit (INDEPENDENT_AMBULATORY_CARE_PROVIDER_SITE_OTHER): Payer: Self-pay | Admitting: Family Medicine

## 2023-04-09 DIAGNOSIS — Z87898 Personal history of other specified conditions: Secondary | ICD-10-CM | POA: Diagnosis not present

## 2023-04-09 DIAGNOSIS — G4452 New daily persistent headache (NDPH): Secondary | ICD-10-CM | POA: Diagnosis not present

## 2023-04-09 DIAGNOSIS — M4802 Spinal stenosis, cervical region: Secondary | ICD-10-CM | POA: Diagnosis not present

## 2023-04-09 DIAGNOSIS — M5412 Radiculopathy, cervical region: Secondary | ICD-10-CM | POA: Diagnosis not present

## 2023-04-09 DIAGNOSIS — R200 Anesthesia of skin: Secondary | ICD-10-CM | POA: Diagnosis not present

## 2023-04-09 DIAGNOSIS — M542 Cervicalgia: Secondary | ICD-10-CM | POA: Diagnosis not present

## 2023-04-12 ENCOUNTER — Other Ambulatory Visit (INDEPENDENT_AMBULATORY_CARE_PROVIDER_SITE_OTHER): Payer: Self-pay | Admitting: Family Medicine

## 2023-04-12 DIAGNOSIS — F3289 Other specified depressive episodes: Secondary | ICD-10-CM

## 2023-04-13 ENCOUNTER — Other Ambulatory Visit (INDEPENDENT_AMBULATORY_CARE_PROVIDER_SITE_OTHER): Payer: Self-pay | Admitting: Family Medicine

## 2023-04-13 ENCOUNTER — Encounter (INDEPENDENT_AMBULATORY_CARE_PROVIDER_SITE_OTHER): Payer: Self-pay | Admitting: Family Medicine

## 2023-04-13 DIAGNOSIS — F3289 Other specified depressive episodes: Secondary | ICD-10-CM

## 2023-04-14 MED ORDER — TOPIRAMATE 50 MG PO TABS: 50.0000 mg | ORAL_TABLET | Freq: Three times a day (TID) | ORAL | 0 refills | Status: AC

## 2023-04-14 NOTE — Telephone Encounter (Signed)
Please refill x 1

## 2023-04-15 DIAGNOSIS — Z6841 Body Mass Index (BMI) 40.0 and over, adult: Secondary | ICD-10-CM | POA: Diagnosis not present

## 2023-04-15 DIAGNOSIS — M1711 Unilateral primary osteoarthritis, right knee: Secondary | ICD-10-CM | POA: Diagnosis not present

## 2023-04-29 ENCOUNTER — Encounter: Payer: Self-pay | Admitting: Internal Medicine

## 2023-04-29 ENCOUNTER — Ambulatory Visit (INDEPENDENT_AMBULATORY_CARE_PROVIDER_SITE_OTHER): Payer: BC Managed Care – PPO | Admitting: Family Medicine

## 2023-04-29 ENCOUNTER — Encounter (INDEPENDENT_AMBULATORY_CARE_PROVIDER_SITE_OTHER): Payer: Self-pay | Admitting: Family Medicine

## 2023-04-29 VITALS — BP 110/71 | HR 78 | Temp 98.5°F | Ht 68.0 in | Wt 247.0 lb

## 2023-04-29 DIAGNOSIS — R7303 Prediabetes: Secondary | ICD-10-CM | POA: Diagnosis not present

## 2023-04-29 DIAGNOSIS — Z6837 Body mass index (BMI) 37.0-37.9, adult: Secondary | ICD-10-CM

## 2023-04-29 DIAGNOSIS — R519 Headache, unspecified: Secondary | ICD-10-CM | POA: Diagnosis not present

## 2023-04-29 DIAGNOSIS — E669 Obesity, unspecified: Secondary | ICD-10-CM | POA: Diagnosis not present

## 2023-04-29 DIAGNOSIS — E559 Vitamin D deficiency, unspecified: Secondary | ICD-10-CM | POA: Diagnosis not present

## 2023-04-29 DIAGNOSIS — G43809 Other migraine, not intractable, without status migrainosus: Secondary | ICD-10-CM

## 2023-04-29 MED ORDER — METFORMIN HCL 500 MG PO TABS: 500.0000 mg | ORAL_TABLET | Freq: Two times a day (BID) | ORAL | 0 refills | Status: DC

## 2023-04-29 NOTE — Progress Notes (Signed)
.smr  Office: [phone]  /  Fax: [phone]  WEIGHT SUMMARY AND BIOMETRICS  Anthropometric Measurements Height: 5\' 8"  (1.727 m) Weight: 247 lb (112 kg) BMI (Calculated): 37.56 Weight at Last Visit: 254 lb Weight Lost Since Last Visit: 7 lb Weight Gained Since Last Visit: 0 Starting Weight: 305 lb Total Weight Loss (lbs): 58 lb (26.3 kg) Peak Weight: 360 lb   Body Composition  Body Fat %: 50.2 % Fat Mass (lbs): 124 lbs Muscle Mass (lbs): 116.8 lbs Total Body Water (lbs): 88 lbs Visceral Fat Rating : 14   Other Clinical Data Fasting: No Labs: No Today's Visit #: 16 Starting Date: 11/19/21    Chief Complaint: OBESITY  History of Present Illness   Tracy [name] is a 58 year old female with obesity and prediabetes who presents with weight loss efforts and headache management.  She is actively engaged in weight loss, having successfully lost seven pounds over the last two months. She maintains a food journal, aiming for a caloric intake between 1400 and 1500 calories, and consumes at least 100 grams of protein daily, achieving this goal about 50% of the time. She is also increasing her walking as part of her exercise routine.  She has a history of prediabetes, managed through diet, exercise, and weight loss. She is currently taking metformin 500 mg twice daily and requests a refill. Her most recent hemoglobin A1c was 5.6, and she is due for lab checks.  She experiences ongoing headaches related to a pinched nerve in her neck, persisting for six months. She is scheduled to see a Careers adviser. The headaches have improved somewhat with Ajovy injections, which provide partial relief, but they still significantly impact her quality of life. She has tried dry needling, massage, and physical therapy, with massage providing temporary relief. The headaches sometimes lead to unintentional fasting when she forgets to eat due to pain.  She is not currently on thyroid medication, and it  has been over a year since her thyroid levels were checked. She is due for a B12 injection, which she receives regularly.          PHYSICAL EXAM:  Blood pressure 110/71, pulse 78, temperature 98.5 F (36.9 C), height 5\' 8"  (1.727 m), weight 247 lb (112 kg), last menstrual period 07/20/2008, SpO2 100%. Body mass index is 37.56 kg/m.  DIAGNOSTIC DATA REVIEWED:  BMET    Component Value Date/Time   NA 141 11/02/2022 1341   NA 144 11/19/2021 1159   NA 141 09/26/2013 1414   K 3.4 (L) 11/02/2022 1341   K 3.2 (L) 09/26/2013 1414   CL 104 11/02/2022 1341   CL 102 09/26/2013 1414   CO2 29 11/02/2022 1341   CO2 31 09/26/2013 1414   GLUCOSE 99 11/02/2022 1341   GLUCOSE 85 09/26/2013 1414   BUN 19 11/02/2022 1341   BUN 9 11/19/2021 1159   BUN 10 09/26/2013 1414   CREATININE 0.84 11/02/2022 1341   CREATININE 0.71 09/26/2013 1414   CALCIUM 9.3 11/02/2022 1341   CALCIUM 8.9 09/26/2013 1414   GFRNONAA >60 11/02/2022 1341   GFRNONAA >60 09/26/2013 1414   GFRAA >60 05/06/2017 1117   GFRAA >60 09/26/2013 1414   Lab Results  Component Value Date   HGBA1C 5.6 11/19/2021   Lab Results  Component Value Date   INSULIN 5.6 11/19/2021   Lab Results  Component Value Date   TSH 1.090 11/19/2021   CBC    Component Value Date/Time   WBC 6.9 11/02/2022  1341   WBC 6.8 11/07/2021 1051   RBC 4.74 11/02/2022 1341   HGB 13.9 11/02/2022 1341   HGB 11.0 (L) 11/19/2021 1159   HCT 42.3 11/02/2022 1341   HCT 35.8 11/19/2021 1159   PLT 256 11/02/2022 1341   PLT 338 12/05/2013 0938   MCV 89.2 11/02/2022 1341   MCV 77 (L) 11/19/2021 1159   MCV 72 (L) 09/26/2013 1414   MCH 29.3 11/02/2022 1341   MCHC 32.9 11/02/2022 1341   RDW 13.7 11/02/2022 1341   RDW 16.1 (H) 11/19/2021 1159   RDW 17.5 (H) 09/26/2013 1414   Iron Studies    Component Value Date/Time   IRON 123 11/02/2022 1341   TIBC 378 11/02/2022 1341   FERRITIN 37 11/02/2022 1341   FERRITIN 23 12/05/2013 0938   IRONPCTSAT 33 (H)  11/02/2022 1341   Lipid Panel     Component Value Date/Time   CHOL 193 11/19/2021 1159   TRIG 73 11/19/2021 1159   HDL 107 11/19/2021 1159   LDLCALC 73 11/19/2021 1159   Hepatic Function Panel     Component Value Date/Time   PROT 7.3 11/02/2022 1341   PROT 6.9 11/19/2021 1159   PROT 7.5 09/26/2013 1414   ALBUMIN 4.3 11/02/2022 1341   ALBUMIN 4.2 11/19/2021 1159   ALBUMIN 3.5 09/26/2013 1414   AST 23 11/02/2022 1341   ALT 32 11/02/2022 1341   ALT 27 09/26/2013 1414   ALKPHOS 52 11/02/2022 1341   ALKPHOS 72 09/26/2013 1414   BILITOT 0.4 11/02/2022 1341      Component Value Date/Time   TSH 1.090 11/19/2021 1159   Nutritional Lab Results  Component Value Date   VD25OH 12.6 (L) 11/19/2021     Assessment and Plan    Chronic Headaches   Chronic headaches for six months, likely due to a pinched nerve in the neck. Ajovy injections provide partial relief. Scheduled for surgical evaluation. Temporary relief from dry needling, massage, and physical therapy. Prefers to avoid surgery if possible.   - Continue Ajovy injections   - Follow up with the surgeon for evaluation of the pinched nerve   - Continue massage therapy for temporary relief   -increase water in case HA are worsened by dehydration  Obesity   Actively working on weight loss, lost seven pounds in two months. Journaling food intake, aiming for 1400-1500 calories and 100 grams of protein, achieving this 50% of the time. Increasing walking. Cravings for sweets likely due to stress from chronic pain. Discussed stress-related cravings and impact of simple carbohydrates.   - Continue current dietary plan with calorie and protein goals   - Increase physical activity as tolerated   - Monitor weight and dietary adherence    Prediabetes   Currently on metformin 500 mg BID. Recent hemoglobin A1c was 5.6%. Due for lab work. Emphasized importance of diet and exercise in managing prediabetes.   - Refill metformin 500 mg BID    - Order lab work including hemoglobin A1c, kidney function, liver function, electrolytes, cholesterol, and thyroid function    Vitamin D Deficiency   Vitamin D levels need to be checked to ensure she is within the normal range.   - Order vitamin D level    General Health Maintenance   Due for routine lab work and B12 injection.   - Order routine lab work including cholesterol and thyroid function   - Administer B12 injection    Follow-up   - Schedule follow-up appointments for March and April.  She was informed of the importance of frequent follow up visits to maximize her success with intensive lifestyle modifications for her multiple health conditions.    Quillian Quince, MD

## 2023-04-30 LAB — LIPID PANEL WITH LDL/HDL RATIO
Cholesterol, Total: 198 mg/dL (ref 100–199)
HDL: 98 mg/dL (ref >39)
LDL Chol Calc (NIH): 88 mg/dL (ref 0–99)
LDL/HDL Ratio: 0.9 {ratio} (ref 0.0–3.2)
Triglycerides: 68 mg/dL (ref 0–149)
VLDL Cholesterol Cal: 12 mg/dL (ref 5–40)

## 2023-04-30 LAB — CMP14+EGFR
ALT: 88 [IU]/L — ABNORMAL HIGH (ref 0–32)
AST: 75 [IU]/L — ABNORMAL HIGH (ref 0–40)
Albumin: 4 g/dL (ref 3.8–4.9)
Alkaline Phosphatase: 199 [IU]/L — ABNORMAL HIGH (ref 44–121)
BUN/Creatinine Ratio: 19 (ref 9–23)
BUN: 12 mg/dL (ref 6–24)
Bilirubin Total: 0.4 mg/dL (ref 0.0–1.2)
CO2: 26 mmol/L (ref 20–29)
Calcium: 9.4 mg/dL (ref 8.7–10.2)
Chloride: 105 mmol/L (ref 96–106)
Creatinine, Ser: 0.63 mg/dL (ref 0.57–1.00)
Globulin, Total: 2.4 g/dL (ref 1.5–4.5)
Glucose: 80 mg/dL (ref 70–99)
Potassium: 3.5 mmol/L (ref 3.5–5.2)
Sodium: 145 mmol/L — ABNORMAL HIGH (ref 134–144)
Total Protein: 6.4 g/dL (ref 6.0–8.5)
eGFR: 105 mL/min/{1.73_m2} (ref >59)

## 2023-04-30 LAB — HEMOGLOBIN A1C
Est. average glucose Bld gHb Est-mCnc: 114 mg/dL
Hgb A1c MFr Bld: 5.6 % (ref 4.8–5.6)

## 2023-04-30 LAB — INSULIN, RANDOM: INSULIN: 5 u[IU]/mL (ref 2.6–24.9)

## 2023-04-30 LAB — VITAMIN D 25 HYDROXY (VIT D DEFICIENCY, FRACTURES): Vit D, 25-Hydroxy: 46.7 ng/mL (ref 30.0–100.0)

## 2023-04-30 LAB — TSH: TSH: 1.06 u[IU]/mL (ref 0.450–4.500)

## 2023-05-05 DIAGNOSIS — F411 Generalized anxiety disorder: Secondary | ICD-10-CM | POA: Diagnosis not present

## 2023-05-05 DIAGNOSIS — F331 Major depressive disorder, recurrent, moderate: Secondary | ICD-10-CM | POA: Diagnosis not present

## 2023-05-05 DIAGNOSIS — F900 Attention-deficit hyperactivity disorder, predominantly inattentive type: Secondary | ICD-10-CM | POA: Diagnosis not present

## 2023-05-06 ENCOUNTER — Inpatient Hospital Stay: Payer: BC Managed Care – PPO

## 2023-05-06 ENCOUNTER — Inpatient Hospital Stay: Payer: BC Managed Care – PPO | Admitting: Adult Health

## 2023-05-10 ENCOUNTER — Inpatient Hospital Stay: Payer: BC Managed Care – PPO

## 2023-05-10 ENCOUNTER — Inpatient Hospital Stay: Payer: BC Managed Care – PPO | Attending: Adult Health | Admitting: Adult Health

## 2023-05-10 ENCOUNTER — Other Ambulatory Visit: Payer: Self-pay | Admitting: Adult Health

## 2023-05-10 ENCOUNTER — Encounter: Payer: Self-pay | Admitting: Adult Health

## 2023-05-10 ENCOUNTER — Other Ambulatory Visit: Payer: Self-pay

## 2023-05-10 ENCOUNTER — Telehealth: Payer: Self-pay

## 2023-05-10 ENCOUNTER — Encounter: Payer: Self-pay | Admitting: Internal Medicine

## 2023-05-10 ENCOUNTER — Telehealth: Payer: Self-pay | Admitting: *Deleted

## 2023-05-10 ENCOUNTER — Ambulatory Visit (HOSPITAL_COMMUNITY)
Admission: RE | Admit: 2023-05-10 | Discharge: 2023-05-10 | Disposition: A | Discharge status: Home / Self Care | Payer: BC Managed Care – PPO | Source: Ambulatory Visit | Attending: Adult Health | Admitting: Adult Health

## 2023-05-10 VITALS — BP 123/62 | HR 67 | Temp 98.0°F | Resp 17 | Ht 68.0 in | Wt 255.3 lb

## 2023-05-10 DIAGNOSIS — R7989 Other specified abnormal findings of blood chemistry: Secondary | ICD-10-CM

## 2023-05-10 DIAGNOSIS — C50212 Malignant neoplasm of upper-inner quadrant of left female breast: Secondary | ICD-10-CM

## 2023-05-10 DIAGNOSIS — R748 Abnormal levels of other serum enzymes: Secondary | ICD-10-CM | POA: Diagnosis not present

## 2023-05-10 DIAGNOSIS — Z79810 Long term (current) use of selective estrogen receptor modulators (SERMs): Secondary | ICD-10-CM | POA: Insufficient documentation

## 2023-05-10 DIAGNOSIS — Z1721 Progesterone receptor positive status: Secondary | ICD-10-CM | POA: Diagnosis not present

## 2023-05-10 DIAGNOSIS — E538 Deficiency of other specified B group vitamins: Secondary | ICD-10-CM | POA: Diagnosis not present

## 2023-05-10 DIAGNOSIS — Z9884 Bariatric surgery status: Secondary | ICD-10-CM | POA: Diagnosis not present

## 2023-05-10 DIAGNOSIS — Z170 Estrogen receptor positive status [ER+]: Secondary | ICD-10-CM | POA: Diagnosis not present

## 2023-05-10 DIAGNOSIS — Z923 Personal history of irradiation: Secondary | ICD-10-CM | POA: Insufficient documentation

## 2023-05-10 DIAGNOSIS — Z1732 Human epidermal growth factor receptor 2 negative status: Secondary | ICD-10-CM | POA: Diagnosis not present

## 2023-05-10 DIAGNOSIS — Z8639 Personal history of other endocrine, nutritional and metabolic disease: Secondary | ICD-10-CM | POA: Diagnosis not present

## 2023-05-10 DIAGNOSIS — K760 Fatty (change of) liver, not elsewhere classified: Secondary | ICD-10-CM | POA: Diagnosis not present

## 2023-05-10 DIAGNOSIS — Z9049 Acquired absence of other specified parts of digestive tract: Secondary | ICD-10-CM | POA: Diagnosis not present

## 2023-05-10 LAB — CBC WITH DIFFERENTIAL (CANCER CENTER ONLY)
Abs Immature Granulocytes: 0.01 10*3/uL (ref 0.00–0.07)
Basophils Absolute: 0 10*3/uL (ref 0.0–0.1)
Basophils Relative: 1 %
Eosinophils Absolute: 0.2 10*3/uL (ref 0.0–0.5)
Eosinophils Relative: 5 %
HCT: 40.8 % (ref 36.0–46.0)
Hemoglobin: 13.4 g/dL (ref 12.0–15.0)
Immature Granulocytes: 0 %
Lymphocytes Relative: 37 %
Lymphs Abs: 1.4 10*3/uL (ref 0.7–4.0)
MCH: 29.1 pg (ref 26.0–34.0)
MCHC: 32.8 g/dL (ref 30.0–36.0)
MCV: 88.7 fL (ref 80.0–100.0)
Monocytes Absolute: 0.3 10*3/uL (ref 0.1–1.0)
Monocytes Relative: 8 %
Neutro Abs: 1.9 10*3/uL (ref 1.7–7.7)
Neutrophils Relative %: 49 %
Platelet Count: 242 10*3/uL (ref 150–400)
RBC: 4.6 MIL/uL (ref 3.87–5.11)
RDW: 14.3 % (ref 11.5–15.5)
WBC Count: 3.9 10*3/uL — ABNORMAL LOW (ref 4.0–10.5)
nRBC: 0 % (ref 0.0–0.2)

## 2023-05-10 LAB — CMP (CANCER CENTER ONLY)
ALT: 121 U/L — ABNORMAL HIGH (ref 0–44)
AST: 205 U/L (ref 15–41)
Albumin: 3.9 g/dL (ref 3.5–5.0)
Alkaline Phosphatase: 131 U/L — ABNORMAL HIGH (ref 38–126)
Anion gap: 6 (ref 5–15)
BUN: 13 mg/dL (ref 6–20)
CO2: 31 mmol/L (ref 22–32)
Calcium: 9 mg/dL (ref 8.9–10.3)
Chloride: 105 mmol/L (ref 98–111)
Creatinine: 0.64 mg/dL (ref 0.44–1.00)
GFR, Estimated: >60 mL/min (ref >60)
Glucose, Bld: 97 mg/dL (ref 70–99)
Potassium: 3.2 mmol/L — ABNORMAL LOW (ref 3.5–5.1)
Sodium: 142 mmol/L (ref 135–145)
Total Bilirubin: 0.4 mg/dL (ref 0.0–1.2)
Total Protein: 6.9 g/dL (ref 6.5–8.1)

## 2023-05-10 LAB — IRON AND IRON BINDING CAPACITY (CC-WL,HP ONLY)
Iron: 117 ug/dL (ref 28–170)
Saturation Ratios: 32 % — ABNORMAL HIGH (ref 10.4–31.8)
TIBC: 371 ug/dL (ref 250–450)
UIBC: 254 ug/dL (ref 148–442)

## 2023-05-10 LAB — VITAMIN B12: Vitamin B-12: 1227 pg/mL — ABNORMAL HIGH (ref 180–914)

## 2023-05-10 LAB — FERRITIN: Ferritin: 53 ng/mL (ref 11–307)

## 2023-05-10 NOTE — Telephone Encounter (Signed)
 Critical lab value reported: AST 205, ALT 121, Alk Phos 131  Notified Dr. Mosetta Putt and Team.    Pt's last lab draw was done on 04/29/2023 and now has significant increase in lab results.

## 2023-05-10 NOTE — Telephone Encounter (Signed)
 Pt called and notified of [place] appt at [facility] hospital at 430 pm and to stay NPO. Pt verbalized understanding

## 2023-05-10 NOTE — Progress Notes (Unsigned)
 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 [facility]) Staging form: Breast, AJCC 8th Edition - Clinical stage from 02/12/2021: Stage IA (cT1b, cN0, cM0, G2, ER+, PR+, HER2-) - Signed by Lonie Peak, MD on 02/12/2021 Stage prefix: Initial diagnosis Histologic grading system: 3 grade system - Pathologic stage from 02/25/2021: Stage IA (pT1b, pN0, cM0, G2, ER+, PR+, HER2-, Oncotype DX score: 3) - Signed by Malachy Mood, MD on 05/03/2021 Stage prefix: Initial diagnosis Multigene prognostic tests performed: Oncotype DX Recurrence score range: Less than 11 Histologic grading system: 3 grade system Residual tumor (R): R0   SUMMARY OF ONCOLOGIC HISTORY: Oncology History Overview Note   Cancer Staging  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 (HCC) Staging form: Breast, AJCC 8th Edition - Clinical stage from 02/12/2021: Stage IA (cT1b, cN0, cM0, G2, ER+, PR+, HER2-) - Signed by Lonie Peak, MD on 02/12/2021 Stage prefix: Initial diagnosis Histologic grading system: 3 grade system - Pathologic stage from 02/25/2021: Stage IA (pT1b, pN0, cM0, G2, ER+, PR+, HER2-, Oncotype DX score: 3) - Signed by Malachy Mood, MD on 05/03/2021 Stage prefix: Initial diagnosis Multigene prognostic tests performed: Oncotype DX Recurrence score range: Less than 11 Histologic grading system: 3 grade system Residual tumor (R): R0 - None     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 Suspicious left breast mass corresponding with the screening mammographic findings. 2. No suspicious left axillary lymphadenopathy.   RECOMMENDATION: Ultrasound-guided biopsy of the left breast.   01/23/2021 Procedure   ULTRASOUND GUIDED LEFT BREAST CORE NEEDLE BIOPSY   IMPRESSION: Ultrasound guided biopsy of a  mass in the left breast at 11 o'clock. No apparent complications.   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 Based on the biopsy, the carcinoma appears Nottingham grade 2 of 3 and measures 0.4 cm in greatest linear extent.  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 BREAST, LEFT ADDITIONAL ANTERIOSUPERIOR MARGIN, EXCISION:  - Benign breast parenchyma, negative for carcinoma   C. BREAST, LEFT ADDITIONAL MEDIOPOSTERIOR MARGIN, EXCISION:  - Benign breast parenchyma, negative for carcinoma   D. BREAST, LEFT ADDITIONAL INFERIOLATERAL MARGIN, EXCISION:  - Benign breast parenchyma, negative for carcinoma   E. LYMPH NODE, LEFT AXILLARY, SENTINEL, EXCISION:  -  Lymph node, negative for carcinoma (0/1)   F. LYMPH NODE, LEFT AXILLARY,  SENTINEL, EXCISION:  - Lymph node, negative for carcinoma (0/1)   G. LYMPH NODE, LEFT AXILLARY, SENTINEL, EXCISION:  - Lymph node, negative for carcinoma (0/1)    02/25/2021 Oncotype testing   Recurrence score of 4, predicts a risk of recurrence outside the breast over the next 9 years of 3%    02/25/2021 Cancer Staging   Staging form: Breast, AJCC 8th Edition - Pathologic stage from 02/25/2021: Stage IA (pT1b, pN0, cM0, G2, ER+, PR+, HER2-, Oncotype DX score: 3) - Signed by Malachy Mood, MD on 05/03/2021 Stage prefix: Initial diagnosis Multigene prognostic tests performed: Oncotype DX Recurrence score range: Less than 11 Histologic grading system: 3 grade system Residual tumor (R): R0 - None   04/03/2021 - 05/05/2021 Radiation Therapy   Site Technique Total Dose (Gy) Dose per Fx (Gy) Completed Fx Beam Energies  Breast, Left: Breast_L 3D 40.05/40.05 2.67 15/15 10X, 15X  Breast, Left: Breast_L_Bst 3D 10/10 2 5/5 6X, 10X     05/2021 -  Anti-estrogen oral therapy   Tamoxifen daily     CURRENT THERAPY: Tamoxifen  INTERVAL HISTORY:   Discussed the use of AI scribe software for clinical note transcription with the patient, who gave verbal consent to proceed.  Tracy [name] 58 y.o. female returns for    Patient Active Problem List   Diagnosis Date Noted   BMI 38.0-38.9,adult 03/04/2023   BMI 45.0-49.9, adult (HCC) 08/19/2022   Obesity, Beginning BMI 50.59 08/19/2022   Closed nondisplaced fracture of sixth cervical vertebra with routine healing 04/29/2022   Polyphagia 04/01/2022   Hyperglycemia 02/23/2022   Elevated blood pressure reading 02/23/2022   Depression with anxiety 12/31/2021   Insulin resistance 12/03/2021   Other constipation 12/03/2021   Other fatigue 11/19/2021   SOBOE (shortness of breath on exertion) 11/19/2021   Depression screening 11/19/2021   Hypertension 11/19/2021   Depression 11/19/2021   Vitamin D deficiency 11/19/2021   Prediabetes 11/14/2021    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   Lactose intolerance 11/14/2021   Class 3 severe obesity with serious comorbidity and body mass index (BMI) of 50.0 to 59.9 in adult [facility]) 11/14/2021   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  MEDICAL HISTORY: Past Medical History:  Diagnosis Date   ADHD    Alcohol abuse    Anemia    Anxiety    Anxiety and depression    Arthritis    left knee   Asthma    hardly uses inhaler   B12 deficiency    Cancer (HCC)    Constipation    Depression    Diverticulosis    Fatty liver 2012   pt states she was told this once   Headache    Migraines   Heart murmur    pt was told this, no echo   History of stomach ulcers    Hypertension    history no longer requires medication management   IBS (irritable bowel syndrome)    IDA (iron deficiency anemia)    Infertility, female  Joint pain    Lactose intolerance    Melasma    Palpitations    Perforated ulcer (HCC)    Pre-diabetes    PUD (peptic ulcer disease)    Sleep apnea    mild, no CPAP ordered   SOBOE (shortness of breath on exertion)    Vitamin D deficiency     SURGICAL HISTORY: Past Surgical History:  Procedure Laterality Date   ABDOMINAL HYSTERECTOMY     ANKLE RECONSTRUCTION Left 09/09/2018   Procedure: BROSTRUM-GOULD LEFT;  Surgeon: Gwyneth Revels, DPM;  Location: [facility] ORS;  Service: Podiatry;  Laterality: Left;   BREAST LUMPECTOMY WITH RADIOACTIVE SEED AND SENTINEL LYMPH NODE BIOPSY Left 02/25/2021   Procedure: LEFT BREAST LUMPECTOMY WITH RADIOACTIVE  SEED AND SENTINEL LYMPH NODE BIOPSY;  Surgeon: Harriette Bouillon, MD;  Location: [facility] OR;  Service: General;  Laterality: Left;   CHOLECYSTECTOMY     COLONOSCOPY     CYSTOSCOPY N/A 08/05/2016   Procedure: CYSTOSCOPY;  Surgeon: Carrington Clamp, MD;  Location: [facility] OR[facility];  Service: Gynecology;  Laterality: N/A;   ENDOMETRIAL ABLATION W/ NOVASURE     GASTRIC BYPASS OPEN     LYSIS OF ADHESION N/A 08/05/2016   Procedure: LYSIS OF ADHESION;  Surgeon: Carrington Clamp, MD;  Location: [facility];  Service: Gynecology;  Laterality: N/A;   perforated ulcer surgery     ROBOTIC ASSISTED SALPINGO OOPHERECTOMY Right 05/12/2017   Procedure: XI ROBOTIC ASSISTED RIGHT OOPHORECTOMY, Fulgeration of Endometriosis, Peritoneal Biopsy;  Surgeon: Carrington Clamp, MD;  Location: [facility];  Service: Gynecology;  Laterality: Right;   ROBOTIC ASSISTED TOTAL HYSTERECTOMY WITH SALPINGECTOMY Bilateral 08/05/2016   Procedure: ROBOTIC ASSISTED TOTAL HYSTERECTOMY WITH SALPINGECTOMY;  Surgeon: Carrington Clamp, MD;  Location: [facility];  Service: Gynecology;  Laterality: Bilateral;   TENDON REPAIR Left 09/09/2018   Procedure: TENOLYSIS, MULTIPLE;  Surgeon: Gwyneth Revels, DPM;  Location: [facility] ORS;  Service: Podiatry;  Laterality: Left;   TONSILLECTOMY     UPPER GI ENDOSCOPY      SOCIAL HISTORY: Social History   Socioeconomic History   Marital status: Married    Spouse name: Not on file   Number of children: Not on file   Years of education: Not on file   Highest education level: Not on file  Occupational History   Not on file  Tobacco Use   Smoking status: Never   Smokeless tobacco: Never  Vaping Use   Vaping status: Never Used  Substance and Sexual Activity   Alcohol use: Yes    Alcohol/week: 4.0 standard drinks of alcohol    Types: 4 Glasses of wine per week   Drug use: No   Sexual activity: Never  Other Topics Concern   Not on file  Social History Narrative   Not on file   Social Drivers of Health   Financial  Resource Strain: Low Risk  (04/15/2023)   Received from [facility]   Overall Financial Resource Strain (CARDIA)    Difficulty of Paying Living Expenses: Not hard at all  Food Insecurity: No Food Insecurity (04/15/2023)   Received from [facility]   Hunger Vital Sign    Worried About Running Out of Food in the Last Year: Never true    Ran Out of Food in the Last Year: Never true  Transportation Needs: No Transportation Needs (04/15/2023)   Received from [facility] - Transportation    In the past 12 months, has lack of transportation kept you from  medical appointments or from getting medications?: No    Lack of Transportation (Non-Medical): No  Physical Activity: Not on file  Stress: Not on file  Social Connections: Unknown (01/07/2023)   Received from [facility]   Social Network    Social Network: Not on file  Intimate Partner Violence: Unknown (01/07/2023)   Received from [facility]   HITS    Physically Hurt: Not on file    Insult or Talk Down To: Not on file    Threaten Physical Harm: Not on file    Scream or Curse: Not on file    FAMILY HISTORY: Family History  Problem Relation Age of Onset   Diabetes Mother    COPD Mother    Anxiety disorder Mother    Depression Mother    Diabetes type II Mother    Obesity Mother    Crohn's disease Mother    Bipolar disorder Mother    Coronary artery disease Father    Diabetes type II Father    Hypertension Father    Pancreatic cancer Sister 63       died   Obesity Sister    Breast cancer Maternal Aunt 64   Depression Maternal Grandmother    Obesity Maternal Grandmother    Cancer Maternal Grandmother    Coronary artery disease Maternal Grandmother    Glaucoma Paternal Grandmother    Hypertension Paternal Grandmother    Osteoarthritis Paternal Grandmother    Skin cancer Paternal Grandmother    Coronary artery disease Paternal Grandfather    Leukemia Paternal  Grandfather     Review of Systems  Constitutional:  Negative for appetite change, chills, fatigue, fever and unexpected weight change.  HENT:   Negative for hearing loss, lump/mass and trouble swallowing.   Eyes:  Negative for eye problems and icterus.  Respiratory:  Negative for chest tightness, cough and shortness of breath.   Cardiovascular:  Negative for chest pain, leg swelling and palpitations.  Gastrointestinal:  Negative for abdominal distention, abdominal pain, constipation, diarrhea, nausea and vomiting.  Endocrine: Negative for hot flashes.  Genitourinary:  Negative for difficulty urinating.   Musculoskeletal:  Negative for arthralgias.  Skin:  Negative for itching and rash.  Neurological:  Negative for dizziness, extremity weakness, headaches and numbness.  Hematological:  Negative for adenopathy. Does not bruise/bleed easily.  Psychiatric/Behavioral:  Negative for depression. The patient is not nervous/anxious.       PHYSICAL EXAMINATION    Vitals:   05/10/23 1014  BP: 123/62  Pulse: 67  Resp: 17  Temp: 98 F (36.7 C)  SpO2: 99%    Physical Exam Constitutional:      General: She is not in acute distress.    Appearance: Normal appearance. She is not toxic-appearing.  HENT:     Head: Normocephalic and atraumatic.     Mouth/Throat:     Mouth: Mucous membranes are moist.     Pharynx: Oropharynx is clear. No oropharyngeal exudate or posterior oropharyngeal erythema.  Eyes:     General: No scleral icterus. Cardiovascular:     Rate and Rhythm: Normal rate and regular rhythm.     Pulses: Normal pulses.     Heart sounds: Normal heart sounds.  Pulmonary:     Effort: Pulmonary effort is normal.     Breath sounds: Normal breath sounds.  Abdominal:     General: Abdomen is flat. Bowel sounds are normal. There is no distension.     Palpations: Abdomen is soft.     Tenderness:  There is no abdominal tenderness.  Musculoskeletal:        General: No swelling.      Cervical back: Neck supple.  Lymphadenopathy:     Cervical: No cervical adenopathy.  Skin:    General: Skin is warm and dry.     Findings: No rash.  Neurological:     General: No focal deficit present.     Mental Status: She is alert.  Psychiatric:        Mood and Affect: Mood normal.        Behavior: Behavior normal.     LABORATORY DATA:  CBC    Component Value Date/Time   WBC 3.9 (L) 05/10/2023 0949   WBC 6.8 11/07/2021 1051   RBC 4.60 05/10/2023 0949   HGB 13.4 05/10/2023 0949   HGB 11.0 (L) 11/19/2021 1159   HCT 40.8 05/10/2023 0949   HCT 35.8 11/19/2021 1159   PLT 242 05/10/2023 0949   PLT 338 12/05/2013 0938   MCV 88.7 05/10/2023 0949   MCV 77 (L) 11/19/2021 1159   MCV 72 (L) 09/26/2013 1414   MCH 29.1 05/10/2023 0949   MCHC 32.8 05/10/2023 0949   RDW 14.3 05/10/2023 0949   RDW 16.1 (H) 11/19/2021 1159   RDW 17.5 (H) 09/26/2013 1414   LYMPHSABS 1.4 05/10/2023 0949   LYMPHSABS 1.6 11/19/2021 1159   LYMPHSABS 2.8 09/26/2013 1414   MONOABS 0.3 05/10/2023 0949   MONOABS 0.4 09/26/2013 1414   EOSABS 0.2 05/10/2023 0949   EOSABS 0.1 11/19/2021 1159   EOSABS 0.3 09/26/2013 1414   BASOSABS 0.0 05/10/2023 0949   BASOSABS 0.1 11/19/2021 1159   BASOSABS 0.1 09/26/2013 1414    CMP     Component Value Date/Time   NA 145 (H) 04/29/2023 1146   NA 141 09/26/2013 1414   K 3.5 04/29/2023 1146   K 3.2 (L) 09/26/2013 1414   CL 105 04/29/2023 1146   CL 102 09/26/2013 1414   CO2 26 04/29/2023 1146   CO2 31 09/26/2013 1414   GLUCOSE 80 04/29/2023 1146   GLUCOSE 99 11/02/2022 1341   GLUCOSE 85 09/26/2013 1414   BUN 12 04/29/2023 1146   BUN 10 09/26/2013 1414   CREATININE 0.63 04/29/2023 1146   CREATININE 0.84 11/02/2022 1341   CREATININE 0.71 09/26/2013 1414   CALCIUM 9.4 04/29/2023 1146   CALCIUM 8.9 09/26/2013 1414   PROT 6.4 04/29/2023 1146   PROT 7.5 09/26/2013 1414   ALBUMIN 4.0 04/29/2023 1146   ALBUMIN 3.5 09/26/2013 1414   AST 75 (H) 04/29/2023 1146    AST 23 11/02/2022 1341   ALT 88 (H) 04/29/2023 1146   ALT 32 11/02/2022 1341   ALT 27 09/26/2013 1414   ALKPHOS 199 (H) 04/29/2023 1146   ALKPHOS 72 09/26/2013 1414   BILITOT 0.4 04/29/2023 1146   BILITOT 0.4 11/02/2022 1341   GFRNONAA >60 11/02/2022 1341   GFRNONAA >60 09/26/2013 1414   GFRAA >60 05/06/2017 1117   GFRAA >60 09/26/2013 1414       PENDING LABS:   RADIOGRAPHIC STUDIES:  No results found.   PATHOLOGY:     ASSESSMENT and THERAPY PLAN:   No problem-specific Assessment & Plan notes found for this encounter.   No orders of the defined types were placed in this encounter.   All questions were answered. The patient knows to call the clinic with any problems, questions or concerns. We can certainly see the patient much sooner if necessary. This note was electronically signed. Mardella Layman  Odette Fraction, NP 05/10/2023

## 2023-05-11 ENCOUNTER — Other Ambulatory Visit: Payer: Self-pay | Admitting: Adult Health

## 2023-05-11 ENCOUNTER — Other Ambulatory Visit: Payer: Self-pay | Admitting: Pulmonary Disease

## 2023-05-11 ENCOUNTER — Encounter: Payer: Self-pay | Admitting: Gastroenterology

## 2023-05-11 ENCOUNTER — Other Ambulatory Visit: Payer: Self-pay | Admitting: *Deleted

## 2023-05-11 ENCOUNTER — Encounter: Payer: Self-pay | Admitting: Internal Medicine

## 2023-05-11 ENCOUNTER — Encounter: Payer: Self-pay | Admitting: Adult Health

## 2023-05-11 DIAGNOSIS — Z170 Estrogen receptor positive status [ER+]: Secondary | ICD-10-CM

## 2023-05-11 DIAGNOSIS — R7989 Other specified abnormal findings of blood chemistry: Secondary | ICD-10-CM

## 2023-05-11 NOTE — Assessment & Plan Note (Signed)
 Breast Cancer No evidence of malignancy on last mammogram (01/18/2023). On Tamoxifen since 05/2021. No new breast symptoms or changes on physical exam. -Continue Tamoxifen as prescribed. -Plan for next mammogram in 01/2024.  Elevated Liver Enzymes Recent labs showed elevated liver enzymes. Patient has history of alcohol use but has been abstinent for over a year. Currently on Tamoxifen, occasional tylenol.  Has previously undergone cholecystectomy. -Check liver enzymes from today's labs. -If still elevated, order an ultrasound of the liver. -Consult with pharmacist to review medications for potential liver impact.  Weight Loss Significant weight loss achieved through consistent efforts. -Congratulate and encourage continued efforts.  Neck Pain and Headaches Chronic headaches due to pinched nerve from previous neck injury. Currently under management with pain medication and physical therapy. -Continue current management plan. -Consider consultation with a specialist if symptoms persist or worsen.  Follow-up Plan for a 63-month follow-up or sooner if needed based on liver enzyme results and potential ultrasound.

## 2023-05-11 NOTE — Progress Notes (Signed)
 Reviewed patient with Dr. Lavon Paganini in GI who recommended MRI as it is more sensitive to evaluate for intrinsic etiology of LFT elevation.  I called Andjela and reviewe dwith her and she verbalized understanding.    Lillard Anes, NP 05/11/23 2:07 PM Medical Oncology and Hematology [facility] [address] [place], [place] [address] Tel. [phone]    Fax. [phone]

## 2023-05-13 ENCOUNTER — Telehealth: Payer: Self-pay | Admitting: Pulmonary Disease

## 2023-05-13 ENCOUNTER — Other Ambulatory Visit (INDEPENDENT_AMBULATORY_CARE_PROVIDER_SITE_OTHER): Payer: Self-pay | Admitting: Family Medicine

## 2023-05-13 ENCOUNTER — Encounter (INDEPENDENT_AMBULATORY_CARE_PROVIDER_SITE_OTHER): Payer: Self-pay | Admitting: Family Medicine

## 2023-05-13 DIAGNOSIS — F3289 Other specified depressive episodes: Secondary | ICD-10-CM

## 2023-05-13 NOTE — Telephone Encounter (Signed)
 Called and spoke to patient. She is requesting refill on Ambien 12.5mg . last refilled 10/20/2022 #30 with 3 refills.   Dr. Wynona Neat, please advise. Thanks

## 2023-05-13 NOTE — Telephone Encounter (Signed)
 Patient is requesting medication refill for Ambien. Please advise Dr. Wynona Neat

## 2023-05-13 NOTE — Telephone Encounter (Signed)
 Patient states needs refill for Ambien CR. Patient out of the medication. Pharmacy is [facility] [place]. Patient phone number is [phone].

## 2023-05-14 ENCOUNTER — Ambulatory Visit (HOSPITAL_COMMUNITY)
Admission: RE | Admit: 2023-05-14 | Discharge: 2023-05-14 | Disposition: A | Discharge status: Home / Self Care | Payer: BC Managed Care – PPO | Source: Ambulatory Visit | Attending: Adult Health | Admitting: Adult Health

## 2023-05-14 DIAGNOSIS — M4312 Spondylolisthesis, cervical region: Secondary | ICD-10-CM | POA: Diagnosis not present

## 2023-05-14 DIAGNOSIS — R7989 Other specified abnormal findings of blood chemistry: Secondary | ICD-10-CM | POA: Insufficient documentation

## 2023-05-14 DIAGNOSIS — C50212 Malignant neoplasm of upper-inner quadrant of left female breast: Secondary | ICD-10-CM | POA: Insufficient documentation

## 2023-05-14 DIAGNOSIS — M4722 Other spondylosis with radiculopathy, cervical region: Secondary | ICD-10-CM | POA: Diagnosis not present

## 2023-05-14 DIAGNOSIS — R160 Hepatomegaly, not elsewhere classified: Secondary | ICD-10-CM | POA: Diagnosis not present

## 2023-05-14 DIAGNOSIS — K838 Other specified diseases of biliary tract: Secondary | ICD-10-CM | POA: Diagnosis not present

## 2023-05-14 DIAGNOSIS — Z170 Estrogen receptor positive status [ER+]: Secondary | ICD-10-CM | POA: Diagnosis not present

## 2023-05-14 DIAGNOSIS — M542 Cervicalgia: Secondary | ICD-10-CM | POA: Diagnosis not present

## 2023-05-14 MED ORDER — GADOBUTROL 1 MMOL/ML IV SOLN
10.0000 mL | Freq: Once | INTRAVENOUS | Status: AC | PRN
  Administered 2023-05-14: 10 mL via INTRAVENOUS

## 2023-05-17 ENCOUNTER — Other Ambulatory Visit (INDEPENDENT_AMBULATORY_CARE_PROVIDER_SITE_OTHER): Payer: Self-pay | Admitting: Family Medicine

## 2023-05-17 DIAGNOSIS — F3289 Other specified depressive episodes: Secondary | ICD-10-CM

## 2023-05-17 NOTE — Telephone Encounter (Signed)
 Patient is requesting update on Rx request.   Dr. Val Eagle, please advise. Thanks

## 2023-05-17 NOTE — Telephone Encounter (Signed)
 Patient is calling for an update of her medication. She is completely out of it. [phone]

## 2023-05-20 ENCOUNTER — Encounter: Payer: Self-pay | Admitting: Pulmonary Disease

## 2023-05-24 DIAGNOSIS — H6983 Other specified disorders of Eustachian tube, bilateral: Secondary | ICD-10-CM | POA: Diagnosis not present

## 2023-05-24 DIAGNOSIS — H6121 Impacted cerumen, right ear: Secondary | ICD-10-CM | POA: Diagnosis not present

## 2023-05-24 DIAGNOSIS — J301 Allergic rhinitis due to pollen: Secondary | ICD-10-CM | POA: Diagnosis not present

## 2023-05-27 ENCOUNTER — Ambulatory Visit (INDEPENDENT_AMBULATORY_CARE_PROVIDER_SITE_OTHER): Payer: Self-pay | Admitting: Family Medicine

## 2023-05-28 DIAGNOSIS — Z0001 Encounter for general adult medical examination with abnormal findings: Secondary | ICD-10-CM | POA: Diagnosis not present

## 2023-05-28 DIAGNOSIS — Z6841 Body Mass Index (BMI) 40.0 and over, adult: Secondary | ICD-10-CM | POA: Diagnosis not present

## 2023-05-28 DIAGNOSIS — Z1331 Encounter for screening for depression: Secondary | ICD-10-CM | POA: Diagnosis not present

## 2023-05-28 DIAGNOSIS — L811 Chloasma: Secondary | ICD-10-CM | POA: Diagnosis not present

## 2023-05-28 DIAGNOSIS — R7303 Prediabetes: Secondary | ICD-10-CM | POA: Diagnosis not present

## 2023-06-01 ENCOUNTER — Inpatient Hospital Stay

## 2023-06-02 ENCOUNTER — Other Ambulatory Visit

## 2023-06-02 ENCOUNTER — Inpatient Hospital Stay: Attending: Adult Health

## 2023-06-02 DIAGNOSIS — F900 Attention-deficit hyperactivity disorder, predominantly inattentive type: Secondary | ICD-10-CM | POA: Diagnosis not present

## 2023-06-02 DIAGNOSIS — Z1732 Human epidermal growth factor receptor 2 negative status: Secondary | ICD-10-CM | POA: Diagnosis not present

## 2023-06-02 DIAGNOSIS — Z923 Personal history of irradiation: Secondary | ICD-10-CM | POA: Insufficient documentation

## 2023-06-02 DIAGNOSIS — C50212 Malignant neoplasm of upper-inner quadrant of left female breast: Secondary | ICD-10-CM | POA: Insufficient documentation

## 2023-06-02 DIAGNOSIS — R7989 Other specified abnormal findings of blood chemistry: Secondary | ICD-10-CM | POA: Insufficient documentation

## 2023-06-02 DIAGNOSIS — F331 Major depressive disorder, recurrent, moderate: Secondary | ICD-10-CM | POA: Diagnosis not present

## 2023-06-02 DIAGNOSIS — Z1721 Progesterone receptor positive status: Secondary | ICD-10-CM | POA: Diagnosis not present

## 2023-06-02 DIAGNOSIS — Z170 Estrogen receptor positive status [ER+]: Secondary | ICD-10-CM | POA: Insufficient documentation

## 2023-06-02 DIAGNOSIS — Z79810 Long term (current) use of selective estrogen receptor modulators (SERMs): Secondary | ICD-10-CM | POA: Diagnosis not present

## 2023-06-02 DIAGNOSIS — F411 Generalized anxiety disorder: Secondary | ICD-10-CM | POA: Diagnosis not present

## 2023-06-02 DIAGNOSIS — E538 Deficiency of other specified B group vitamins: Secondary | ICD-10-CM

## 2023-06-02 LAB — CMP (CANCER CENTER ONLY)
ALT: 29 U/L (ref 0–44)
AST: 28 U/L (ref 15–41)
Albumin: 3.7 g/dL (ref 3.5–5.0)
Alkaline Phosphatase: 117 U/L (ref 38–126)
Anion gap: 5 (ref 5–15)
BUN: 8 mg/dL (ref 6–20)
CO2: 31 mmol/L (ref 22–32)
Calcium: 9.1 mg/dL (ref 8.9–10.3)
Chloride: 106 mmol/L (ref 98–111)
Creatinine: 0.57 mg/dL (ref 0.44–1.00)
GFR, Estimated: >60 mL/min (ref >60)
Glucose, Bld: 92 mg/dL (ref 70–99)
Potassium: 3.8 mmol/L (ref 3.5–5.1)
Sodium: 142 mmol/L (ref 135–145)
Total Bilirubin: 0.4 mg/dL (ref 0.0–1.2)
Total Protein: 6.5 g/dL (ref 6.5–8.1)

## 2023-06-02 LAB — CBC WITH DIFFERENTIAL (CANCER CENTER ONLY)
Abs Immature Granulocytes: 0.01 10*3/uL (ref 0.00–0.07)
Basophils Absolute: 0.1 10*3/uL (ref 0.0–0.1)
Basophils Relative: 1 %
Eosinophils Absolute: 0.3 10*3/uL (ref 0.0–0.5)
Eosinophils Relative: 5 %
HCT: 40.2 % (ref 36.0–46.0)
Hemoglobin: 12.9 g/dL (ref 12.0–15.0)
Immature Granulocytes: 0 %
Lymphocytes Relative: 40 %
Lymphs Abs: 2.5 10*3/uL (ref 0.7–4.0)
MCH: 29.3 pg (ref 26.0–34.0)
MCHC: 32.1 g/dL (ref 30.0–36.0)
MCV: 91.4 fL (ref 80.0–100.0)
Monocytes Absolute: 0.3 10*3/uL (ref 0.1–1.0)
Monocytes Relative: 5 %
Neutro Abs: 3.1 10*3/uL (ref 1.7–7.7)
Neutrophils Relative %: 49 %
Platelet Count: 248 10*3/uL (ref 150–400)
RBC: 4.4 MIL/uL (ref 3.87–5.11)
RDW: 13.8 % (ref 11.5–15.5)
WBC Count: 6.2 10*3/uL (ref 4.0–10.5)
nRBC: 0 % (ref 0.0–0.2)

## 2023-06-02 LAB — IRON AND IRON BINDING CAPACITY (CC-WL,HP ONLY)
Iron: 124 ug/dL (ref 28–170)
Saturation Ratios: 33 % — ABNORMAL HIGH (ref 10.4–31.8)
TIBC: 377 ug/dL (ref 250–450)
UIBC: 253 ug/dL (ref 148–442)

## 2023-06-02 LAB — FERRITIN: Ferritin: 13 ng/mL (ref 11–307)

## 2023-06-04 ENCOUNTER — Encounter: Payer: Self-pay | Admitting: Hematology

## 2023-06-16 ENCOUNTER — Encounter: Payer: Self-pay | Admitting: Internal Medicine

## 2023-06-17 DIAGNOSIS — M542 Cervicalgia: Secondary | ICD-10-CM | POA: Diagnosis not present

## 2023-06-23 NOTE — Progress Notes (Unsigned)
 Chief Complaint: Primary GI MD:  HPI:  *** is a  ***  who was referred to me by Ardyth Man, PA-C for a complaint of *** .     Discussed the use of AI scribe software for clinical note transcription with the patient, who gave verbal consent to proceed.  History of Present Illness      PREVIOUS GI WORKUP     Past Medical History:  Diagnosis Date   ADHD    Alcohol abuse    Anemia    Anxiety    Anxiety and depression    Arthritis    left knee   Asthma    hardly uses inhaler   B12 deficiency    Cancer (HCC)    Constipation    Depression    Diverticulosis    Fatty liver 2012   pt states she was told this once   Headache    Migraines   Heart murmur    pt was told this, no echo   History of stomach ulcers    Hypertension    history no longer requires medication management   IBS (irritable bowel syndrome)    IDA (iron deficiency anemia)    Infertility, female    Joint pain    Lactose intolerance    Melasma    Palpitations    Perforated ulcer (HCC)    Pre-diabetes    PUD (peptic ulcer disease)    Sleep apnea    mild, no CPAP ordered   SOBOE (shortness of breath on exertion)    Vitamin D deficiency     Past Surgical History:  Procedure Laterality Date   ABDOMINAL HYSTERECTOMY     ANKLE RECONSTRUCTION Left 09/09/2018   Procedure: BROSTRUM-GOULD LEFT;  Surgeon: Gwyneth Revels, DPM;  Location: [facility];  Service: Podiatry;  Laterality: Left;   BREAST LUMPECTOMY WITH RADIOACTIVE SEED AND SENTINEL LYMPH NODE BIOPSY Left 02/25/2021   Procedure: LEFT BREAST LUMPECTOMY WITH RADIOACTIVE SEED AND SENTINEL LYMPH NODE BIOPSY;  Surgeon: Harriette Bouillon, MD;  Location: [facility];  Service: General;  Laterality: Left;   CHOLECYSTECTOMY     COLONOSCOPY     CYSTOSCOPY N/A 08/05/2016   Procedure: CYSTOSCOPY;  Surgeon: Carrington Clamp, MD;  Location: [facility];  Service: Gynecology;  Laterality: N/A;   ENDOMETRIAL ABLATION W/ NOVASURE     GASTRIC BYPASS OPEN     LYSIS  OF ADHESION N/A 08/05/2016   Procedure: LYSIS OF ADHESION;  Surgeon: Carrington Clamp, MD;  Location: [facility];  Service: Gynecology;  Laterality: N/A;   perforated ulcer surgery     ROBOTIC ASSISTED SALPINGO OOPHERECTOMY Right 05/12/2017   Procedure: XI ROBOTIC ASSISTED RIGHT OOPHORECTOMY, Fulgeration of Endometriosis, Peritoneal Biopsy;  Surgeon: Carrington Clamp, MD;  Location: [facility];  Service: Gynecology;  Laterality: Right;   ROBOTIC ASSISTED TOTAL HYSTERECTOMY WITH SALPINGECTOMY Bilateral 08/05/2016   Procedure: ROBOTIC ASSISTED TOTAL HYSTERECTOMY WITH SALPINGECTOMY;  Surgeon: Carrington Clamp, MD;  Location: [facility];  Service: Gynecology;  Laterality: Bilateral;   TENDON REPAIR Left 09/09/2018   Procedure: TENOLYSIS, MULTIPLE;  Surgeon: Gwyneth Revels, DPM;  Location: [facility];  Service: Podiatry;  Laterality: Left;   TONSILLECTOMY     UPPER GI ENDOSCOPY      Current Outpatient Medications  Medication Sig Dispense Refill   albuterol (PROVENTIL) (2.5 MG/3ML) 0.083% nebulizer solution Take 2.5 mg by nebulization every 6 (six) hours as needed for wheezing or shortness of breath.     albuterol (VENTOLIN HFA) 108 (90 Base) MCG/ACT inhaler Inhale  2 puffs into the lungs every 6 (six) hours as needed for wheezing or shortness of breath.     ALPRAZolam (XANAX) 0.5 MG tablet Take 0.5 tablets by mouth 2 (two) times daily as needed for anxiety.   0   azelastine (ASTELIN) 0.1 % nasal spray Place 1 spray into both nostrils daily as needed for allergies or rhinitis.  12   cetirizine (ZYRTEC) 10 MG tablet Take 10 mg by mouth daily as needed (allergies).     Cyanocobalamin 1000 MCG/ML KIT Inject 1,000 mcg as directed every 30 (thirty) days.     desvenlafaxine (PRISTIQ) 50 MG 24 hr tablet Take 50 mg by mouth daily.     gabapentin (NEURONTIN) 100 MG capsule Take 1 capsule (100 mg total) by mouth at bedtime. OK to increase dose to 3 capsules at night in 2-3 weeks if needed for hot flushes 90 capsule 1    hydrochlorothiazide (MICROZIDE) 12.5 MG capsule Take 12.5 mg by mouth daily.     lamoTRIgine (LAMICTAL) 100 MG tablet Take 200 mg by mouth 2 (two) times daily.     metFORMIN (GLUCOPHAGE) 500 MG tablet Take 1 tablet (500 mg total) by mouth 2 (two) times daily with a meal. 180 tablet 0   tamoxifen (NOLVADEX) 20 MG tablet TAKE 1 TABLET(20 MG) BY MOUTH DAILY 90 tablet 3   topiramate (TOPAMAX) 50 MG tablet Take 1 tablet (50 mg total) by mouth 3 (three) times daily. 90 tablet 0   valACYclovir (VALTREX) 1000 MG tablet Take 1,000 mg by mouth daily as needed (cold sores).      Vitamin D, Ergocalciferol, (DRISDOL) 1.25 MG (50000 UNIT) CAPS capsule Take 1 capsule (50,000 Units total) by mouth every 7 (seven) days. 4 capsule 0   zolpidem (AMBIEN CR) 12.5 MG CR tablet TAKE 1 TABLET(12.5 MG) BY MOUTH AT BEDTIME 30 tablet 2   No current facility-administered medications for this visit.    Allergies as of 06/24/2023 - Review Complete 05/10/2023  Allergen Reaction Noted   Ibuprofen Other (See Comments) 03/06/2016   Morphine Hives 03/30/2014   Doxepin Other (See Comments) 05/11/2016   Nsaids Other (See Comments) 01/29/2015   Tramadol Nausea Only 02/03/2021   Trazodone Other (See Comments) 05/11/2016    Family History  Problem Relation Age of Onset   Diabetes Mother    COPD Mother    Anxiety disorder Mother    Depression Mother    Diabetes type II Mother    Obesity Mother    Crohn's disease Mother    Bipolar disorder Mother    Coronary artery disease Father    Diabetes type II Father    Hypertension Father    Pancreatic cancer Sister 60       died   Obesity Sister    Breast cancer Maternal Aunt 74   Depression Maternal Grandmother    Obesity Maternal Grandmother    Cancer Maternal Grandmother    Coronary artery disease Maternal Grandmother    Glaucoma Paternal Grandmother    Hypertension Paternal Grandmother    Osteoarthritis Paternal Grandmother    Skin cancer Paternal Grandmother     Coronary artery disease Paternal Grandfather    Leukemia Paternal Grandfather     Social History   Socioeconomic History   Marital status: Married    Spouse name: Not on file   Number of children: Not on file   Years of education: Not on file   Highest education level: Not on file  Occupational History   Not  on file  Tobacco Use   Smoking status: Never   Smokeless tobacco: Never  Vaping Use   Vaping status: Never Used  Substance and Sexual Activity   Alcohol use: Yes    Alcohol/week: 4.0 standard drinks of alcohol    Types: 4 Glasses of wine per week   Drug use: No   Sexual activity: Never  Other Topics Concern   Not on file  Social History Narrative   Not on file   Social Drivers of Health   Financial Resource Strain: Low Risk  (05/24/2023)   Received from [facility]   Overall Financial Resource Strain (CARDIA)    Difficulty of Paying Living Expenses: Not hard at all  Food Insecurity: No Food Insecurity (05/24/2023)   Received from [facility]   Hunger Vital Sign    Worried About Running Out of Food in the Last Year: Never true    Ran Out of Food in the Last Year: Never true  Transportation Needs: No Transportation Needs (05/24/2023)   Received from [facility] - Transportation    In the past 12 months, has lack of transportation kept you from medical appointments or from getting medications?: No    Lack of Transportation (Non-Medical): No  Physical Activity: Not on file  Stress: Not on file  Social Connections: Unknown (01/07/2023)   Received from [facility]   Social Network    Social Network: Not on file  Intimate Partner Violence: Unknown (01/07/2023)   Received from [facility]   HITS    Physically Hurt: Not on file    Insult or Talk Down To: Not on file    Threaten Physical Harm: Not on file    Scream or Curse: Not on file    Review of Systems:    Constitutional: No weight loss,  fever, chills, weakness or fatigue HEENT: Eyes: No change in vision               Ears, Nose, Throat:  No change in hearing or congestion Skin: No rash or itching Cardiovascular: No chest pain, chest pressure or palpitations   Respiratory: No SOB or cough Gastrointestinal: See HPI and otherwise negative Genitourinary: No dysuria or change in urinary frequency Neurological: No headache, dizziness or syncope Musculoskeletal: No new muscle or joint pain Hematologic: No bleeding or bruising Psychiatric: No history of depression or anxiety    Physical Exam:  Vital signs: LMP 07/20/2008   Constitutional: NAD, Well developed, Well nourished, alert and cooperative Head:  Normocephalic and atraumatic. Eyes:   PEERL, EOMI. No icterus. Conjunctiva pink. Respiratory: Respirations even and unlabored. Lungs clear to auscultation bilaterally.   No wheezes, crackles, or rhonchi.  Cardiovascular:  Regular rate and rhythm. No peripheral edema, cyanosis or pallor.  Gastrointestinal:  Soft, nondistended, nontender. No rebound or guarding. Normal bowel sounds. No appreciable masses or hepatomegaly. Rectal:  Not performed.  Msk:  Symmetrical without gross deformities. Without edema, no deformity or joint abnormality.  Neurologic:  Alert and  oriented x4;  grossly normal neurologically.  Skin:   Dry and intact without significant lesions or rashes. Psychiatric: Oriented to person, place and time. Demonstrates good judgement and reason without abnormal affect or behaviors.  Physical Exam    RELEVANT LABS AND IMAGING: CBC    Component Value Date/Time   WBC 6.2 06/02/2023 1320   WBC 6.8 11/07/2021 1051   RBC 4.40 06/02/2023 1320   HGB 12.9 06/02/2023 1320  HGB 11.0 (L) 11/19/2021 1159   HCT 40.2 06/02/2023 1320   HCT 35.8 11/19/2021 1159   PLT 248 06/02/2023 1320   PLT 338 12/05/2013 0938   MCV 91.4 06/02/2023 1320   MCV 77 (L) 11/19/2021 1159   MCV 72 (L) 09/26/2013 1414   MCH 29.3  06/02/2023 1320   MCHC 32.1 06/02/2023 1320   RDW 13.8 06/02/2023 1320   RDW 16.1 (H) 11/19/2021 1159   RDW 17.5 (H) 09/26/2013 1414   LYMPHSABS 2.5 06/02/2023 1320   LYMPHSABS 1.6 11/19/2021 1159   LYMPHSABS 2.8 09/26/2013 1414   MONOABS 0.3 06/02/2023 1320   MONOABS 0.4 09/26/2013 1414   EOSABS 0.3 06/02/2023 1320   EOSABS 0.1 11/19/2021 1159   EOSABS 0.3 09/26/2013 1414   BASOSABS 0.1 06/02/2023 1320   BASOSABS 0.1 11/19/2021 1159   BASOSABS 0.1 09/26/2013 1414    CMP     Component Value Date/Time   NA 142 06/02/2023 1320   NA 145 (H) 04/29/2023 1146   NA 141 09/26/2013 1414   K 3.8 06/02/2023 1320   K 3.2 (L) 09/26/2013 1414   CL 106 06/02/2023 1320   CL 102 09/26/2013 1414   CO2 31 06/02/2023 1320   CO2 31 09/26/2013 1414   GLUCOSE 92 06/02/2023 1320   GLUCOSE 85 09/26/2013 1414   BUN 8 06/02/2023 1320   BUN 12 04/29/2023 1146   BUN 10 09/26/2013 1414   CREATININE 0.57 06/02/2023 1320   CREATININE 0.71 09/26/2013 1414   CALCIUM 9.1 06/02/2023 1320   CALCIUM 8.9 09/26/2013 1414   PROT 6.5 06/02/2023 1320   PROT 6.4 04/29/2023 1146   PROT 7.5 09/26/2013 1414   ALBUMIN 3.7 06/02/2023 1320   ALBUMIN 4.0 04/29/2023 1146   ALBUMIN 3.5 09/26/2013 1414   AST 28 06/02/2023 1320   ALT 29 06/02/2023 1320   ALT 27 09/26/2013 1414   ALKPHOS 117 06/02/2023 1320   ALKPHOS 72 09/26/2013 1414   BILITOT 0.4 06/02/2023 1320   GFRNONAA >60 06/02/2023 1320   GFRNONAA >60 09/26/2013 1414   GFRAA >60 05/06/2017 1117   GFRAA >60 09/26/2013 1414     Assessment/Plan:   Assessment and Plan Assessment & Plan    Elevated LFTs Normal up until 04/29/2023 with elevated AST/ALT/alk phos which have since normalized with normal CBC, platelets, iron studies, thyroid.  Abdominal ultrasound showed fatty liver infiltration, previous cholecystectomy, no ductal dilation.  MRI liver showed no evidence of metastatic breast disease, but a megaly (20.2 cm), mild postop biliary ductal  dilation CBD measuring 0.8 cm.  Roux-en-Y gastric bypass. 04/29/23 initial elevation: AST 75/ALT 88/alk phos 190.  05/10/23: AST 205/ALT 121/alk phos 131.   06/02/23: normalization with AST 28/ALT 29/alk phos 171  Breast cancer Diagnosed 2022 stage Ia, on tamoxifen  Lara [facility]ch [facility] 06/23/2023, 12:25 PM  Cc: Ardyth Man, PA-C

## 2023-06-23 NOTE — Telephone Encounter (Signed)
Message from Plan Denied. This health benefit plan does not cover the following services, supplies, drugs or charges: Any treatment or regimen, medical or surgical, for the purpose of reducing or controlling the weight of the member, or for the treatment of obesity, except for surgical treatment of morbid obesity, or as specifically covered by this health benefit plan

## 2023-06-24 ENCOUNTER — Ambulatory Visit: Payer: BC Managed Care – PPO | Admitting: Gastroenterology

## 2023-06-24 ENCOUNTER — Other Ambulatory Visit (INDEPENDENT_AMBULATORY_CARE_PROVIDER_SITE_OTHER)

## 2023-06-24 ENCOUNTER — Encounter: Payer: Self-pay | Admitting: Gastroenterology

## 2023-06-24 VITALS — BP 118/76 | HR 76 | Ht 65.0 in | Wt 253.0 lb

## 2023-06-24 DIAGNOSIS — Z79891 Long term (current) use of opiate analgesic: Secondary | ICD-10-CM

## 2023-06-24 DIAGNOSIS — R7989 Other specified abnormal findings of blood chemistry: Secondary | ICD-10-CM

## 2023-06-24 DIAGNOSIS — Z853 Personal history of malignant neoplasm of breast: Secondary | ICD-10-CM

## 2023-06-24 DIAGNOSIS — K5900 Constipation, unspecified: Secondary | ICD-10-CM | POA: Diagnosis not present

## 2023-06-24 DIAGNOSIS — Z1211 Encounter for screening for malignant neoplasm of colon: Secondary | ICD-10-CM

## 2023-06-24 DIAGNOSIS — K760 Fatty (change of) liver, not elsewhere classified: Secondary | ICD-10-CM

## 2023-06-24 DIAGNOSIS — Z170 Estrogen receptor positive status [ER+]: Secondary | ICD-10-CM

## 2023-06-24 LAB — CBC WITH DIFFERENTIAL/PLATELET
Basophils Absolute: 0.1 10*3/uL (ref 0.0–0.1)
Basophils Relative: 1 % (ref 0.0–3.0)
Eosinophils Absolute: 0.3 10*3/uL (ref 0.0–0.7)
Eosinophils Relative: 3.4 % (ref 0.0–5.0)
HCT: 43 % (ref 36.0–46.0)
Hemoglobin: 14.2 g/dL (ref 12.0–15.0)
Lymphocytes Relative: 35.4 % (ref 12.0–46.0)
Lymphs Abs: 2.7 10*3/uL (ref 0.7–4.0)
MCHC: 33 g/dL (ref 30.0–36.0)
MCV: 89.3 fl (ref 78.0–100.0)
Monocytes Absolute: 0.4 10*3/uL (ref 0.1–1.0)
Monocytes Relative: 5.3 % (ref 3.0–12.0)
Neutro Abs: 4.1 10*3/uL (ref 1.4–7.7)
Neutrophils Relative %: 54.9 % (ref 43.0–77.0)
Platelets: 300 10*3/uL (ref 150.0–400.0)
RBC: 4.82 Mil/uL (ref 3.87–5.11)
RDW: 12.8 % (ref 11.5–15.5)
WBC: 7.5 10*3/uL (ref 4.0–10.5)

## 2023-06-24 LAB — COMPREHENSIVE METABOLIC PANEL WITH GFR
ALT: 70 U/L — ABNORMAL HIGH (ref 0–35)
AST: 102 U/L — ABNORMAL HIGH (ref 0–37)
Albumin: 4.4 g/dL (ref 3.5–5.2)
Alkaline Phosphatase: 241 U/L — ABNORMAL HIGH (ref 39–117)
BUN: 16 mg/dL (ref 6–23)
CO2: 29 meq/L (ref 19–32)
Calcium: 9.3 mg/dL (ref 8.4–10.5)
Chloride: 100 meq/L (ref 96–112)
Creatinine, Ser: 0.68 mg/dL (ref 0.40–1.20)
GFR: 97.86 mL/min (ref >60.00)
Glucose, Bld: 93 mg/dL (ref 70–99)
Potassium: 3.5 meq/L (ref 3.5–5.1)
Sodium: 140 meq/L (ref 135–145)
Total Bilirubin: 0.5 mg/dL (ref 0.2–1.2)
Total Protein: 7.4 g/dL (ref 6.0–8.3)

## 2023-06-24 LAB — IBC + FERRITIN
Ferritin: 33.2 ng/mL (ref 10.0–291.0)
Iron: 126 ug/dL (ref 42–145)
Saturation Ratios: 28.8 % (ref 20.0–50.0)
TIBC: 436.8 ug/dL (ref 250.0–450.0)
Transferrin: 312 mg/dL (ref 212.0–360.0)

## 2023-06-24 LAB — PROTIME-INR
INR: 1 ratio (ref 0.8–1.0)
Prothrombin Time: 10.7 s (ref 9.6–13.1)

## 2023-06-24 NOTE — Patient Instructions (Signed)
 Your provider has requested that you go to the basement level for lab work before leaving today. Press "B" on the elevator. The lab is located at the first door on the left as you exit the elevator.  Due to recent changes in healthcare laws, you may see the results of your imaging and laboratory studies on MyChart before your provider has had a chance to review them.  We understand that in some cases there may be results that are confusing or concerning to you. Not all laboratory results come back in the same time frame and the provider may be waiting for multiple results in order to interpret others.  Please give [place] 48 hours in order for your provider to thoroughly review all the results before contacting the office for clarification of your results.   _______________________________________________________  If your blood pressure at your visit was 140/90 or greater, please contact your primary care physician to follow up on this.  _______________________________________________________  If you are age 79 or older, your body mass index should be between 23-30. Your Body mass index is 42.1 kg/m. If this is out of the aforementioned range listed, please consider follow up with your Primary Care Provider.  If you are age 56 or younger, your body mass index should be between 19-25. Your Body mass index is 42.1 kg/m. If this is out of the aformentioned range listed, please consider follow up with your Primary Care Provider.   ________________________________________________________  The [facility] providers would like to encourage you to use [facility] to communicate with providers for non-urgent requests or questions.  Due to long hold times on the telephone, sending your provider a message by [facility] may be a faster and more efficient way to get a response.  Please allow 48 business hours for a response.  Please remember that this is for non-urgent requests.   _______________________________________________________ Thank you for trusting me with your gastrointestinal care!   Boone Master, PA

## 2023-06-28 ENCOUNTER — Other Ambulatory Visit: Payer: Self-pay | Admitting: *Deleted

## 2023-06-28 LAB — IGA: Immunoglobulin A: 462 mg/dL — ABNORMAL HIGH (ref 47–310)

## 2023-06-28 LAB — MITOCHONDRIAL ANTIBODIES: Mitochondrial M2 Ab, IgG: <=20 U (ref <=20.0)

## 2023-06-28 LAB — ANA: Anti Nuclear Antibody (ANA): NEGATIVE

## 2023-06-28 LAB — ALPHA-1-ANTITRYPSIN: A-1 Antitrypsin, Ser: 133 mg/dL (ref 83–199)

## 2023-06-28 LAB — HEPATITIS A ANTIBODY, TOTAL: Hepatitis A AB,Total: NONREACTIVE

## 2023-06-28 LAB — IGG: IgG (Immunoglobin G), Serum: 1046 mg/dL (ref 600–1640)

## 2023-06-28 LAB — HEPATITIS B SURFACE ANTIBODY,QUALITATIVE: Hep B S Ab: NONREACTIVE

## 2023-06-28 LAB — HEPATITIS B SURFACE ANTIGEN: Hepatitis B Surface Ag: NONREACTIVE

## 2023-06-28 LAB — CERULOPLASMIN: Ceruloplasmin: 44 mg/dL (ref 14–48)

## 2023-06-28 LAB — HEPATITIS C ANTIBODY: Hepatitis C Ab: NONREACTIVE

## 2023-06-28 LAB — TISSUE TRANSGLUTAMINASE, IGA: (tTG) Ab, IgA: <1 U/mL

## 2023-06-28 LAB — COPPER, SERUM: Copper: 221 ug/dL — ABNORMAL HIGH (ref 70–175)

## 2023-06-28 LAB — ANTI-SMOOTH MUSCLE ANTIBODY, IGG: Actin (Smooth Muscle) Antibody (IGG): <20 U (ref <20)

## 2023-06-29 ENCOUNTER — Encounter: Payer: Self-pay | Admitting: Hematology

## 2023-07-01 ENCOUNTER — Ambulatory Visit (INDEPENDENT_AMBULATORY_CARE_PROVIDER_SITE_OTHER): Payer: BC Managed Care – PPO | Admitting: Family Medicine

## 2023-07-06 ENCOUNTER — Ambulatory Visit

## 2023-07-06 DIAGNOSIS — F900 Attention-deficit hyperactivity disorder, predominantly inattentive type: Secondary | ICD-10-CM | POA: Diagnosis not present

## 2023-07-06 DIAGNOSIS — F331 Major depressive disorder, recurrent, moderate: Secondary | ICD-10-CM | POA: Diagnosis not present

## 2023-07-06 DIAGNOSIS — F411 Generalized anxiety disorder: Secondary | ICD-10-CM | POA: Diagnosis not present

## 2023-07-08 ENCOUNTER — Ambulatory Visit

## 2023-07-08 DIAGNOSIS — K760 Fatty (change of) liver, not elsewhere classified: Secondary | ICD-10-CM

## 2023-07-08 DIAGNOSIS — Z23 Encounter for immunization: Secondary | ICD-10-CM | POA: Diagnosis not present

## 2023-07-08 DIAGNOSIS — R7989 Other specified abnormal findings of blood chemistry: Secondary | ICD-10-CM

## 2023-07-08 NOTE — Progress Notes (Signed)
 Patient in office today for Heplisav B vaccine. This is her 1st vaccine for this series. Injection was given in (R) deltoid. Patient tolerated injection, without any immediate reactions. Pt advised to return on 08/05/23 for 2nd injection. Heplisav information sheet provided to patient for for review.

## 2023-07-09 DIAGNOSIS — M542 Cervicalgia: Secondary | ICD-10-CM | POA: Diagnosis not present

## 2023-07-09 DIAGNOSIS — G4486 Cervicogenic headache: Secondary | ICD-10-CM | POA: Diagnosis not present

## 2023-07-14 DIAGNOSIS — M170 Bilateral primary osteoarthritis of knee: Secondary | ICD-10-CM | POA: Diagnosis not present

## 2023-07-14 DIAGNOSIS — Z6841 Body Mass Index (BMI) 40.0 and over, adult: Secondary | ICD-10-CM | POA: Diagnosis not present

## 2023-07-15 ENCOUNTER — Encounter: Payer: Self-pay | Admitting: Hematology

## 2023-07-15 ENCOUNTER — Other Ambulatory Visit: Payer: Self-pay | Admitting: Nurse Practitioner

## 2023-07-15 DIAGNOSIS — Z170 Estrogen receptor positive status [ER+]: Secondary | ICD-10-CM

## 2023-07-16 ENCOUNTER — Telehealth: Payer: Self-pay | Admitting: Nurse Practitioner

## 2023-07-18 NOTE — Progress Notes (Signed)
 Addendum: Reviewed and agree with assessment and management plan. I do not see a FIB4 result? Allan Bacigalupi, Amber Bail, MD

## 2023-07-20 ENCOUNTER — Inpatient Hospital Stay: Attending: Adult Health

## 2023-07-20 DIAGNOSIS — Z1721 Progesterone receptor positive status: Secondary | ICD-10-CM | POA: Diagnosis not present

## 2023-07-20 DIAGNOSIS — Z170 Estrogen receptor positive status [ER+]: Secondary | ICD-10-CM | POA: Insufficient documentation

## 2023-07-20 DIAGNOSIS — C50212 Malignant neoplasm of upper-inner quadrant of left female breast: Secondary | ICD-10-CM | POA: Diagnosis not present

## 2023-07-20 DIAGNOSIS — Z79810 Long term (current) use of selective estrogen receptor modulators (SERMs): Secondary | ICD-10-CM | POA: Diagnosis not present

## 2023-07-20 DIAGNOSIS — Z923 Personal history of irradiation: Secondary | ICD-10-CM | POA: Insufficient documentation

## 2023-07-20 DIAGNOSIS — Z1732 Human epidermal growth factor receptor 2 negative status: Secondary | ICD-10-CM | POA: Insufficient documentation

## 2023-07-21 ENCOUNTER — Telehealth: Payer: Self-pay | Admitting: Gastroenterology

## 2023-07-21 DIAGNOSIS — K760 Fatty (change of) liver, not elsewhere classified: Secondary | ICD-10-CM

## 2023-07-21 DIAGNOSIS — R7989 Other specified abnormal findings of blood chemistry: Secondary | ICD-10-CM

## 2023-07-21 LAB — FOLLICLE STIMULATING HORMONE: FSH: 28.5 m[IU]/mL

## 2023-07-21 LAB — ESTRADIOL: Estradiol: 6.3 pg/mL

## 2023-07-21 NOTE — Addendum Note (Signed)
 Addended by: Glennette Lanius on: 07/21/2023 02:37 PM   Modules accepted: Orders

## 2023-07-21 NOTE — Telephone Encounter (Signed)
 Spoke with patient and advised of elastography for further evaluation of increased FIB4 score. Patient is scheduled at [facility] on 07/30/23 at 8 am, 730 am arrival, NPO midnight. Patient verbalizes understanding.

## 2023-07-21 NOTE — Telephone Encounter (Signed)
 FIB 4 score: 2.24  Please set up elastography for further evaluation.

## 2023-07-22 ENCOUNTER — Encounter: Payer: Self-pay | Admitting: Pulmonary Disease

## 2023-07-22 ENCOUNTER — Other Ambulatory Visit: Payer: Self-pay | Admitting: Pulmonary Disease

## 2023-07-22 MED ORDER — ZOLPIDEM TARTRATE ER 12.5 MG PO TBCR: 12.5000 mg | EXTENDED_RELEASE_TABLET | Freq: Every day | ORAL | 2 refills | Status: DC

## 2023-07-22 NOTE — Telephone Encounter (Signed)
 Please advise on Ambien refill.

## 2023-07-26 DIAGNOSIS — G4486 Cervicogenic headache: Secondary | ICD-10-CM | POA: Diagnosis not present

## 2023-07-26 DIAGNOSIS — M542 Cervicalgia: Secondary | ICD-10-CM | POA: Diagnosis not present

## 2023-07-30 ENCOUNTER — Ambulatory Visit (HOSPITAL_COMMUNITY)
Admission: RE | Admit: 2023-07-30 | Discharge: 2023-07-30 | Disposition: A | Discharge status: Home / Self Care | Source: Ambulatory Visit | Attending: Gastroenterology | Admitting: Gastroenterology

## 2023-07-30 ENCOUNTER — Ambulatory Visit: Payer: Self-pay | Admitting: Gastroenterology

## 2023-07-30 DIAGNOSIS — K760 Fatty (change of) liver, not elsewhere classified: Secondary | ICD-10-CM | POA: Insufficient documentation

## 2023-07-30 DIAGNOSIS — R7989 Other specified abnormal findings of blood chemistry: Secondary | ICD-10-CM | POA: Diagnosis not present

## 2023-08-01 ENCOUNTER — Other Ambulatory Visit (INDEPENDENT_AMBULATORY_CARE_PROVIDER_SITE_OTHER): Payer: Self-pay | Admitting: Family Medicine

## 2023-08-01 DIAGNOSIS — R7303 Prediabetes: Secondary | ICD-10-CM

## 2023-08-02 DIAGNOSIS — M542 Cervicalgia: Secondary | ICD-10-CM | POA: Diagnosis not present

## 2023-08-02 DIAGNOSIS — G4486 Cervicogenic headache: Secondary | ICD-10-CM | POA: Diagnosis not present

## 2023-08-05 ENCOUNTER — Ambulatory Visit

## 2023-08-05 DIAGNOSIS — M542 Cervicalgia: Secondary | ICD-10-CM | POA: Diagnosis not present

## 2023-08-05 DIAGNOSIS — G629 Polyneuropathy, unspecified: Secondary | ICD-10-CM | POA: Diagnosis not present

## 2023-08-06 ENCOUNTER — Ambulatory Visit (INDEPENDENT_AMBULATORY_CARE_PROVIDER_SITE_OTHER)

## 2023-08-06 DIAGNOSIS — Z23 Encounter for immunization: Secondary | ICD-10-CM

## 2023-08-10 DIAGNOSIS — G4452 New daily persistent headache (NDPH): Secondary | ICD-10-CM | POA: Diagnosis not present

## 2023-08-10 DIAGNOSIS — R748 Abnormal levels of other serum enzymes: Secondary | ICD-10-CM | POA: Diagnosis not present

## 2023-08-10 DIAGNOSIS — T8090XD Unspecified complication following infusion and therapeutic injection, subsequent encounter: Secondary | ICD-10-CM | POA: Diagnosis not present

## 2023-08-10 DIAGNOSIS — M542 Cervicalgia: Secondary | ICD-10-CM | POA: Diagnosis not present

## 2023-08-11 ENCOUNTER — Ambulatory Visit (INDEPENDENT_AMBULATORY_CARE_PROVIDER_SITE_OTHER): Admitting: Pulmonary Disease

## 2023-08-11 ENCOUNTER — Encounter: Payer: Self-pay | Admitting: Pulmonary Disease

## 2023-08-11 ENCOUNTER — Telehealth: Payer: Self-pay | Admitting: Nurse Practitioner

## 2023-08-11 VITALS — BP 106/73 | HR 74 | Ht 65.0 in | Wt 254.6 lb

## 2023-08-11 DIAGNOSIS — F900 Attention-deficit hyperactivity disorder, predominantly inattentive type: Secondary | ICD-10-CM | POA: Diagnosis not present

## 2023-08-11 DIAGNOSIS — F5102 Adjustment insomnia: Secondary | ICD-10-CM

## 2023-08-11 DIAGNOSIS — F411 Generalized anxiety disorder: Secondary | ICD-10-CM | POA: Diagnosis not present

## 2023-08-11 DIAGNOSIS — F331 Major depressive disorder, recurrent, moderate: Secondary | ICD-10-CM | POA: Diagnosis not present

## 2023-08-11 DIAGNOSIS — G4709 Other insomnia: Secondary | ICD-10-CM

## 2023-08-11 MED ORDER — ZOLPIDEM TARTRATE ER 12.5 MG PO TBCR: 12.5000 mg | EXTENDED_RELEASE_TABLET | Freq: Every day | ORAL | 1 refills | Status: DC

## 2023-08-11 NOTE — Progress Notes (Signed)
 Tracy [name]    [id number]    March 15, 1966  Primary Care Physician:[place], [place]  Referring Physician: Madaline Scales, PA-C [address] [place],  [place] [address]  Chief complaint:   History of snoring Has had sleep study in the past showing mild obstructive sleep apnea  HPI:  Insomnia is better controlled  Continues to use Ambien  and benefits from using it - Getting enough hours of sleep Waking up feeling rested - Able to go to a day without feeling too sleepy  History of mild obstructive sleep apnea - Continues to work on losing weight  Treated for breast cancer previously - Has been stable  Usually tries to go to bed between 930 and 10, falls asleep in about 45 minutes 3-4 awakenings Final wake up time about 7 AM  Never smoked No pets  Dad snored  Has a history of asthma  Outpatient Encounter Medications as of 08/11/2023  Medication Sig   AJOVY 225 MG/1.5ML SOAJ ADMINISTER 1.5 ML UNDER THE SKIN MONTHLY   albuterol  (PROVENTIL ) (2.5 MG/3ML) 0.083% nebulizer solution Take 2.5 mg by nebulization every 6 (six) hours as needed for wheezing or shortness of breath.   albuterol  (VENTOLIN  HFA) 108 (90 Base) MCG/ACT inhaler Inhale 2 puffs into the lungs every 6 (six) hours as needed for wheezing or shortness of breath.   ALPRAZolam  (XANAX ) 0.5 MG tablet Take 0.5 tablets by mouth 2 (two) times daily as needed for anxiety.    amphetamine-dextroamphetamine (ADDERALL XR) 30 MG 24 hr capsule Take 30 mg by mouth every morning.   azelastine (ASTELIN) 0.1 % nasal spray Place 1 spray into both nostrils daily as needed for allergies or rhinitis.   cetirizine (ZYRTEC) 10 MG tablet Take 10 mg by mouth daily as needed (allergies).   Cyanocobalamin  1000 MCG/ML KIT Inject 1,000 mcg as directed every 30 (thirty) days.   desvenlafaxine (PRISTIQ) 50 MG 24 hr tablet Take 50 mg by mouth daily.   gabapentin  (NEURONTIN ) 100 MG capsule Take 1 capsule (100 mg total) by mouth at  bedtime. OK to increase dose to 3 capsules at night in 2-3 weeks if needed for hot flushes   hydrochlorothiazide (MICROZIDE) 12.5 MG capsule Take 12.5 mg by mouth daily.   lamoTRIgine  (LAMICTAL ) 100 MG tablet Take 200 mg by mouth 2 (two) times daily.   metFORMIN  (GLUCOPHAGE ) 500 MG tablet Take 1 tablet (500 mg total) by mouth 2 (two) times daily with a meal.   methocarbamol (ROBAXIN) 500 MG tablet Take 500 mg by mouth.   oxyCODONE  (OXY IR/ROXICODONE ) 5 MG immediate release tablet Take 5 mg by mouth.   tamoxifen  (NOLVADEX ) 20 MG tablet TAKE 1 TABLET(20 MG) BY MOUTH DAILY   topiramate  (TOPAMAX ) 50 MG tablet Take 1 tablet (50 mg total) by mouth 3 (three) times daily.   valACYclovir (VALTREX) 1000 MG tablet Take 1,000 mg by mouth daily as needed (cold sores).    Vitamin D , Ergocalciferol , (DRISDOL ) 1.25 MG (50000 UNIT) CAPS capsule Take 1 capsule (50,000 Units total) by mouth every 7 (seven) days.   zolpidem  (AMBIEN  CR) 12.5 MG CR tablet Take 1 tablet (12.5 mg total) by mouth at bedtime.   No facility-administered encounter medications on file as of 08/11/2023.    Allergies as of 08/11/2023 - Review Complete 08/11/2023  Allergen Reaction Noted   Ibuprofen  Other (See Comments) 03/06/2016   Morphine  Hives 03/30/2014   Doxepin Other (See Comments) 05/11/2016   Nsaids Other (See Comments)  01/29/2015   Tramadol Nausea Only 02/03/2021   Trazodone Other (See Comments) 05/11/2016    Past Medical History:  Diagnosis Date   ADHD    Alcohol abuse    Anemia    Anxiety    Anxiety and depression    Arthritis    left knee   Asthma    hardly uses inhaler   B12 deficiency    Cancer (HCC)    Constipation    Depression    Diverticulosis    Fatty liver 2012   pt states she was told this once   Headache    Migraines   Heart murmur    pt was told this, no echo   History of stomach ulcers    Hypertension    history no longer requires medication management   IBS (irritable bowel syndrome)     IDA (iron  deficiency anemia)    Infertility, female    Joint pain    Lactose intolerance    Melasma    Palpitations    Perforated ulcer (HCC)    Pre-diabetes    PUD (peptic ulcer disease)    Sleep apnea    mild, no CPAP ordered   SOBOE (shortness of breath on exertion)    Vitamin D  deficiency     Past Surgical History:  Procedure Laterality Date   ABDOMINAL HYSTERECTOMY     ANKLE RECONSTRUCTION Left 09/09/2018   Procedure: BROSTRUM-GOULD LEFT;  Surgeon: Anell Baptist, DPM;  Location: [facility];  Service: Podiatry;  Laterality: Left;   BREAST LUMPECTOMY WITH RADIOACTIVE SEED AND SENTINEL LYMPH NODE BIOPSY Left 02/25/2021   Procedure: LEFT BREAST LUMPECTOMY WITH RADIOACTIVE SEED AND SENTINEL LYMPH NODE BIOPSY;  Surgeon: Sim Dryer, MD;  Location: [facility];  Service: General;  Laterality: Left;   CHOLECYSTECTOMY     COLONOSCOPY     CYSTOSCOPY N/A 08/05/2016   Procedure: CYSTOSCOPY;  Surgeon: Matt Song, MD;  Location: [facility];  Service: Gynecology;  Laterality: N/A;   ENDOMETRIAL ABLATION W/ NOVASURE     GASTRIC BYPASS OPEN     LYSIS OF ADHESION N/A 08/05/2016   Procedure: LYSIS OF ADHESION;  Surgeon: Matt Song, MD;  Location: [facility];  Service: Gynecology;  Laterality: N/A;   perforated ulcer surgery     ROBOTIC ASSISTED SALPINGO OOPHERECTOMY Right 05/12/2017   Procedure: XI ROBOTIC ASSISTED RIGHT OOPHORECTOMY, Fulgeration of Endometriosis, Peritoneal Biopsy;  Surgeon: Matt Song, MD;  Location: [facility];  Service: Gynecology;  Laterality: Right;   ROBOTIC ASSISTED TOTAL HYSTERECTOMY WITH SALPINGECTOMY Bilateral 08/05/2016   Procedure: ROBOTIC ASSISTED TOTAL HYSTERECTOMY WITH SALPINGECTOMY;  Surgeon: Matt Song, MD;  Location: [facility];  Service: Gynecology;  Laterality: Bilateral;   TENDON REPAIR Left 09/09/2018   Procedure: TENOLYSIS, MULTIPLE;  Surgeon: Anell Baptist, DPM;  Location: [facility];  Service: Podiatry;  Laterality: Left;   TONSILLECTOMY      UPPER GI ENDOSCOPY      Family History  Problem Relation Age of Onset   Diabetes Mother    COPD Mother    Anxiety disorder Mother    Depression Mother    Diabetes type II Mother    Obesity Mother    Crohn's disease Mother    Bipolar disorder Mother    Coronary artery disease Father    Diabetes type II Father    Hypertension Father    Pancreatic cancer Sister    Obesity Sister    Depression Maternal Grandmother    Obesity Maternal Grandmother    Cancer Maternal Grandmother  Coronary artery disease Maternal Grandmother    Glaucoma Paternal Grandmother    Hypertension Paternal Grandmother    Osteoarthritis Paternal Grandmother    Skin cancer Paternal Grandmother    Coronary artery disease Paternal Grandfather    Leukemia Paternal Grandfather    Breast cancer Maternal Aunt 37    Social History   Socioeconomic History   Marital status: Married    Spouse name: Not on file   Number of children: Not on file   Years of education: Not on file   Highest education level: Not on file  Occupational History   Not on file  Tobacco Use   Smoking status: Never   Smokeless tobacco: Never  Vaping Use   Vaping status: Never Used  Substance and Sexual Activity   Alcohol use: Yes    Alcohol/week: 4.0 standard drinks of alcohol    Types: 4 Glasses of wine per week   Drug use: No   Sexual activity: Never  Other Topics Concern   Not on file  Social History Narrative   Not on file   Social Drivers of Health   Financial Resource Strain: Low Risk  (05/24/2023)   Received from [facility]   Overall Financial Resource Strain (CARDIA)    Difficulty of Paying Living Expenses: Not hard at all  Food Insecurity: No Food Insecurity (05/24/2023)   Received from [facility]   Hunger Vital Sign    Worried About Running Out of Food in the Last Year: Never true    Ran Out of Food in the Last Year: Never true  Transportation Needs: No Transportation Needs  (05/24/2023)   Received from [facility] - Transportation    In the past 12 months, has lack of transportation kept you from medical appointments or from getting medications?: No    Lack of Transportation (Non-Medical): No  Physical Activity: Not on file  Stress: Not on file  Social Connections: Unknown (01/07/2023)   Received from [facility]   Social Network    Social Network: Not on file  Intimate Partner Violence: Unknown (01/07/2023)   Received from [facility]   HITS    Physically Hurt: Not on file    Insult or Talk Down To: Not on file    Threaten Physical Harm: Not on file    Scream or Curse: Not on file    Review of Systems  Respiratory:  Positive for apnea.   Psychiatric/Behavioral:  Positive for sleep disturbance.     Vitals:   08/11/23 1525  BP: 106/73  Pulse: 74  SpO2: 98%     Physical Exam Constitutional:      Appearance: She is obese.  HENT:     Head: Normocephalic.     Nose: Nose normal.     Mouth/Throat:     Comments: Mallampati 4, crowded oropharynx Eyes:     General: No scleral icterus. Cardiovascular:     Rate and Rhythm: Normal rate and regular rhythm.     Heart sounds: No murmur heard.    No friction rub.  Pulmonary:     Effort: No respiratory distress.     Breath sounds: No stridor. No wheezing or rhonchi.  Musculoskeletal:     Cervical back: Normal range of motion. No rigidity or tenderness.  Neurological:     Mental Status: She is alert.  Psychiatric:        Mood and Affect: Mood normal.    Data Reviewed:  Previous sleep study not available for review  Assessment:  Chronic insomnia - Ambien  continues to help - No noted side effects - Wakes up feeling rested - Falls asleep easily and able to maintain sleep with Ambien   History of mild obstructive sleep apnea - Continue weight loss efforts  History of breast cancer -Follows with oncology - Stable  Plan/Recommendations: Continue weight loss  efforts  Refills for Ambien  sent to pharmacy  Follow-up a year from now   Myer Artis MD [facility] 08/11/2023, 3:44 PM  CC: Madaline Scales, PA-C

## 2023-08-11 NOTE — Patient Instructions (Addendum)
 Will send in refills for your Ambien  -90-day supply with 1 refill  Follow-up a year from now  Call us  with any significant concerns

## 2023-08-12 NOTE — Assessment & Plan Note (Addendum)
 No evidence of malignancy on last mammogram (01/18/2023).  -stopped tamoxifen  due to elevated liver enzymes. Did try to restart however, LFTs went back up.  -Trial exemestane  25 mg daily starting 08/13/2023. -Continue to follow-up with GI. -Plan for next mammogram in 01/2024. - Labs and follow-up in 4 to 6 weeks.

## 2023-08-12 NOTE — Progress Notes (Signed)
 Patient Care Team: Madaline Scales, PA-C as PCP - General (Family Medicine) Sonja McNabb, MD as Consulting Physician (Hematology) Colie Dawes, MD as Attending Physician (Radiation Oncology) Sim Dryer, MD as Consulting Physician (General Surgery)  Clinic Day:  08/22/2023  Referring physician: Madaline Scales, PA-C  I connected with Tracy [name] on 08/22/23 at 10:30 AM EDT by telephone and verified that I am speaking with the correct person using two identifiers.   I discussed the limitations, risks, security and privacy concerns of performing an evaluation and management service by telemedicine and the availability of in-person appointments. I also discussed with the patient that there may be a patient responsible charge related to this service. The patient expressed understanding and agreed to proceed.   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  -stopped tamoxifen  due to elevated liver enzymes. Did try to restart however, LFTs went back up.  -Trial exemestane  25 mg daily starting 08/13/2023. -Continue to follow-up with GI. -Plan for next mammogram in 01/2024. - Labs and follow-up in 4 to 6 weeks.    Plan:  Discussed the results of recent hormonal testing. FSH and estradiol  are both in post menopausal range.  Stop tamoxifen  after several episodes of elevated liver functions.  Has been off for a few months now.  He like to be proactive about breast cancer prevention in the future.  Discussed other medications such as letrozole, anastrozole, and exemestane .  Will plan to start exemestane  25 mg daily.  Will follow-up with her in 4 to 6 weeks with lab check  and CBC and CMP prior to visit. Adjust medications as indicated.She will continue to be followed by GI for management of liver abnormalities.    The patient understands the plans discussed today and is in agreement with them.  She knows to contact our office if she develops concerns prior to her next appointment.  I provided 10 minutes of face-to-face time during this encounter and > 50% was spent counseling as documented under my assessment and plan.    Tracy Deis, NP  [facility] - A DEPT OF [facility] [name] [place] [address] Dept: [phone] Dept Fax: [phone]   Orders Placed This Encounter  Procedures   MM 3D DIAGNOSTIC MAMMOGRAM BILATERAL BREAST    [facility] Email confirmed Pf; 01/15/23@[facility] / NO NEED. NO ISSUES. YES HX OF BR CANCER.  NO IMPLANTS.  NO REDUCTIONS. SPOKE WITH PT/ [facility] Epic order    Standing Status:   Future    Expected Date:   01/13/2024    Expiration Date:   08/12/2024    Scheduling Instructions:     Mammogram due on or after 01/19/2024 - please schedule with Bone density test    Reason for Exam (SYMPTOM  OR DIAGNOSIS REQUIRED):   history of breast cancer. screening for new breast cancer    Is the patient pregnant?:   No    Preferred imaging location?:   [facility]   DG Bone Density    Standing Status:   Future    Expected Date:   01/13/2024    Expiration Date:   08/12/2024  Scheduling Instructions:     Please schedule along with 3D mammogram. Thank you    Reason for Exam (SYMPTOM  OR DIAGNOSIS REQUIRED):   estrogen deficiency, screening for osteoporosis    Is patient pregnant?:   No    Preferred imaging location?:   [facility]      CHIEF COMPLAINT:  CC: left breast cancer, estrogen receptor positive   Current Treatment:  start exemestane  25 mg daily on 08/13/2023  INTERVAL HISTORY:  Tracy [name] is here today for repeat clinical assessment. She was last seen by  Tracy Ros, NP on 05/10/2023. Was suggested that she stop taking tamoxifen  as it caused a significant elevation in her liver enzymes. She did try to restart it after the enzymes came down. They went back up after she restarted the medication. Most recent 3D diagnostic mammogram was done 01/18/2023. She had benign changes on the left with no evidence of new or recurrent breast cancer. Oncotype score was done in 2022 with score of 4, indicating risk of recurrence outside of the breast in the next 9 years at 3%.  She denies chest pain, chest pressure, or shortness of breath. She denies headaches or visual disturbances. She denies abdominal pain, nausea, vomiting, or changes in bowel or bladder habits.  She denies fevers or chills. She denies pain. Her appetite is good.   I have reviewed the past medical history, past surgical history, social history and family history with the patient and they are unchanged from previous note.  ALLERGIES:  is allergic to ibuprofen , morphine , doxepin, nsaids, tramadol, and trazodone.  MEDICATIONS:  Current Outpatient Medications  Medication Sig Dispense Refill   exemestane  (AROMASIN ) 25 MG tablet Take 1 tablet (25 mg total) by mouth daily after breakfast. 30 tablet 2   AJOVY 225 MG/1.5ML SOAJ ADMINISTER 1.5 ML UNDER THE SKIN MONTHLY     albuterol  (PROVENTIL ) (2.5 MG/3ML) 0.083% nebulizer solution Take 2.5 mg by nebulization every 6 (six) hours as needed for wheezing or shortness of breath.     albuterol  (VENTOLIN  HFA) 108 (90 Base) MCG/ACT inhaler Inhale 2 puffs into the lungs every 6 (six) hours as needed for wheezing or shortness of breath.     ALPRAZolam  (XANAX ) 0.5 MG tablet Take 0.5 tablets by mouth 2 (two) times daily as needed for anxiety.   0   amphetamine-dextroamphetamine (ADDERALL XR) 30 MG 24 hr capsule Take 30 mg by mouth every morning.     azelastine (ASTELIN) 0.1 % nasal spray Place 1 spray into both nostrils daily as needed for allergies or rhinitis.  12    cetirizine (ZYRTEC) 10 MG tablet Take 10 mg by mouth daily as needed (allergies).     Cyanocobalamin  1000 MCG/ML KIT Inject 1,000 mcg as directed every 30 (thirty) days.     desvenlafaxine (PRISTIQ) 50 MG 24 hr tablet Take 50 mg by mouth daily.     gabapentin  (NEURONTIN ) 100 MG capsule Take 1 capsule (100 mg total) by mouth at bedtime. OK to increase dose to 3 capsules at night in 2-3 weeks if needed for hot flushes 90 capsule 1   hydrochlorothiazide (MICROZIDE) 12.5 MG capsule Take 12.5 mg by mouth daily.     lamoTRIgine  (LAMICTAL ) 100 MG tablet Take 200 mg by mouth 2 (two) times daily.     metFORMIN  (GLUCOPHAGE ) 500 MG tablet Take 1 tablet (500 mg total) by mouth 2 (two) times daily with a meal. 180 tablet 0   methocarbamol (ROBAXIN) 500 MG tablet Take 500 mg by mouth.  oxyCODONE  (OXY IR/ROXICODONE ) 5 MG immediate release tablet Take 5 mg by mouth.     topiramate  (TOPAMAX ) 50 MG tablet Take 1 tablet (50 mg total) by mouth 3 (three) times daily. 90 tablet 0   valACYclovir (VALTREX) 1000 MG tablet Take 1,000 mg by mouth daily as needed (cold sores).      Vitamin D , Ergocalciferol , (DRISDOL ) 1.25 MG (50000 UNIT) CAPS capsule Take 1 capsule (50,000 Units total) by mouth every 7 (seven) days. 4 capsule 0   zolpidem  (AMBIEN  CR) 12.5 MG CR tablet Take 1 tablet (12.5 mg total) by mouth at bedtime. 90 tablet 1   No current facility-administered medications for this visit.    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) Staging form: Breast, AJCC 8th Edition - Clinical stage from 02/12/2021: Stage IA (cT1b, cN0, cM0, G2, ER+, PR+, HER2-) - Signed by Colie Dawes, MD on 02/12/2021 Stage prefix: Initial diagnosis Histologic grading system: 3 grade system - Pathologic stage from 02/25/2021: Stage IA (pT1b, pN0, cM0, G2, ER+, PR+, HER2-, Oncotype DX score: 3) - Signed by Sonja Perry, MD on 05/03/2021 Stage prefix:  Initial diagnosis Multigene prognostic tests performed: Oncotype DX Recurrence score range: Less than 11 Histologic grading system: 3 grade system Residual tumor (R): R0 - None     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 Suspicious left breast mass corresponding with the screening mammographic findings. 2. No suspicious left axillary lymphadenopathy.   RECOMMENDATION: Ultrasound-guided biopsy of the left breast.   01/23/2021 Procedure   ULTRASOUND GUIDED LEFT BREAST CORE NEEDLE BIOPSY   IMPRESSION: Ultrasound guided biopsy of a mass in the left breast at 11 o'clock. No apparent complications.   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 Based on the biopsy, the carcinoma appears Nottingham grade 2 of 3 and measures 0.4 cm in greatest linear extent.  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 BREAST, LEFT,  LUMPECTOMY:  - Invasive ductal carcinoma, 0.9 cm, grade 2  - Ductal carcinoma in situ, low to intermediate grade  - Biopsy site change  - See oncology table   B. BREAST, LEFT ADDITIONAL ANTERIOSUPERIOR MARGIN, EXCISION:  - Benign breast parenchyma, negative for carcinoma   C. BREAST, LEFT ADDITIONAL MEDIOPOSTERIOR MARGIN, EXCISION:  - Benign breast parenchyma, negative for carcinoma   D. BREAST, LEFT ADDITIONAL INFERIOLATERAL MARGIN, EXCISION:  - Benign breast parenchyma, negative for carcinoma   E. LYMPH NODE, LEFT AXILLARY, SENTINEL, EXCISION:  - Lymph node, negative for  carcinoma (0/1)   F. LYMPH NODE, LEFT AXILLARY, SENTINEL, EXCISION:  - Lymph node, negative for carcinoma (0/1)   G. LYMPH NODE, LEFT AXILLARY, SENTINEL, EXCISION:  - Lymph node, negative for carcinoma (0/1)    02/25/2021 Oncotype testing   Recurrence score of 4, predicts a risk of recurrence outside the breast over the next 9 years of 3%    02/25/2021 Cancer Staging   Staging form: Breast, AJCC 8th Edition - Pathologic stage from 02/25/2021: Stage IA (pT1b, pN0, cM0, G2, ER+, PR+, HER2-, Oncotype DX score: 3) - Signed by Sonja Amesti, MD on 05/03/2021 Stage prefix: Initial diagnosis Multigene prognostic tests performed: Oncotype DX Recurrence score range: Less than 11 Histologic grading system: 3 grade system Residual tumor (R): R0 - None   04/03/2021 - 05/05/2021 Radiation Therapy   Site Technique Total Dose (Gy) Dose per Fx (Gy) Completed Fx Beam Energies  Breast, Left: Breast_L 3D 40.05/40.05 2.67 15/15 10X, 15X  Breast, Left: Breast_L_Bst 3D 10/10 2 5/5 6X, 10X     05/2021 -  Anti-estrogen oral therapy   Tamoxifen  daily       REVIEW OF SYSTEMS:   Constitutional: Denies fevers, chills or abnormal weight loss Eyes: Denies blurriness of vision Ears, nose, mouth, throat, and face: Denies mucositis or sore throat Respiratory: Denies cough, dyspnea or wheezes Cardiovascular: Denies palpitation, chest  discomfort or lower extremity swelling Gastrointestinal:  Denies nausea, heartburn or change in bowel habits Skin: Denies abnormal skin rashes Lymphatics: Denies new lymphadenopathy or easy bruising Neurological:Denies numbness, tingling or new weaknesses Behavioral/Psych: Mood is stable, no new changes  All other systems were reviewed with the patient and are negative.   VITALS:   Last menstrual period 07/20/2008.  Wt Readings from Last 3 Encounters:  08/11/23 254 lb 9.6 oz (115.5 kg)  06/24/23 253 lb (114.8 kg)  05/10/23 255 lb 4.8 oz (115.8 kg)    There is no height or weight on file to calculate BMI.  Performance status (ECOG): 1 - Symptomatic but completely ambulatory     LABORATORY DATA:  I have reviewed the data as listed    Component Value Date/Time   NA 140 06/24/2023 1052   NA 145 (H) 04/29/2023 1146   NA 141 09/26/2013 1414   K 3.5 06/24/2023 1052   K 3.2 (L) 09/26/2013 1414   CL 100 06/24/2023 1052   CL 102 09/26/2013 1414   CO2 29 06/24/2023 1052   CO2 31 09/26/2013 1414   GLUCOSE 93 06/24/2023 1052   GLUCOSE 85 09/26/2013 1414   BUN 16 06/24/2023 1052   BUN 12 04/29/2023 1146   BUN 10 09/26/2013 1414   CREATININE 0.68 06/24/2023 1052   CREATININE 0.57 06/02/2023 1320   CREATININE 0.71 09/26/2013 1414   CALCIUM 9.3 06/24/2023 1052   CALCIUM 8.9 09/26/2013 1414   PROT 7.4 06/24/2023 1052   PROT 6.4 04/29/2023 1146   PROT 7.5 09/26/2013 1414   ALBUMIN 4.4 06/24/2023 1052   ALBUMIN 4.0 04/29/2023 1146   ALBUMIN 3.5 09/26/2013 1414   AST 102 (H) 06/24/2023 1052   AST 28 06/02/2023 1320   ALT 70 (H) 06/24/2023 1052   ALT 29 06/02/2023 1320   ALT 27 09/26/2013 1414   ALKPHOS 241 (H) 06/24/2023 1052   ALKPHOS 72 09/26/2013 1414   BILITOT 0.5 06/24/2023 1052   BILITOT 0.4 06/02/2023 1320   GFRNONAA >60 06/02/2023 1320   GFRNONAA >60 09/26/2013 1414   GFRAA >60 05/06/2017 1117   GFRAA >60 09/26/2013 1414  Lab Results  Component Value Date    WBC 7.5 06/24/2023   NEUTROABS 4.1 06/24/2023   HGB 14.2 06/24/2023   HCT 43.0 06/24/2023   MCV 89.3 06/24/2023   PLT 300.0 06/24/2023     RADIOGRAPHIC STUDIES: US  ELASTOGRAPHY LIVER Result Date: 07/30/2023 CLINICAL DATA:  Hepatic steatosis. EXAM: US  ELASTOGRAPHY HEPATIC TECHNIQUE: Sonography of the liver was performed. In addition, ultrasound elastography evaluation of the liver was performed. A region of interest was placed within the right lobe of the liver. Following application of a compressive sonographic pulse, tissue compressibility was assessed. Multiple assessments were performed at the selected site. Median tissue compressibility was determined. Previously, hepatic stiffness was assessed by shear wave velocity. Based on recently published Society of Radiologists in Ultrasound consensus article, reporting is now recommended to be performed in the SI units of pressure (kiloPascals) representing hepatic stiffness/elasticity. The obtained result is compared to the published reference standards. (cACLD = compensated Advanced Chronic Liver Disease) COMPARISON:  May 10, 2023 FINDINGS: Liver: Increased echotexture. No focal lesion. Portal vein is patent on color Doppler imaging with normal direction of blood flow towards the liver. ULTRASOUND HEPATIC ELASTOGRAPHY Device: Siemens Helix VTQ Patient position: Supine Transducer: DAX Number of measurements: 13 Hepatic segment:  8 Median kPa: 3.4 IQR: 2.2 IQR/Median kPa ratio: 0.65 Data quality: i. IQR/Median kPa ratio of 0.6 or greater when using the DAX probe indicates reduced accuracy Diagnostic category:  < or = 5 kPa: high probability of being normal The use of hepatic elastography is applicable to patients with viral hepatitis and non-alcoholic fatty liver disease. At this time, there is insufficient data for the referenced cut-off values and use in other causes of liver disease, including alcoholic liver disease. Patients, however, may be  assessed by elastography and serve as their own reference standard/baseline. In patients with non-alcoholic liver disease, the values suggesting compensated advanced chronic liver disease (cACLD) may be lower, and patients may need additional testing with elasticity results of 7-9 kPa. Please note that abnormal hepatic elasticity and shear wave velocities may also be identified in clinical settings other than with hepatic fibrosis, such as: acute hepatitis, elevated right heart and central venous pressures including use of beta blockers, veno-occlusive disease (Budd-Chiari), infiltrative processes such as mastocytosis/amyloidosis/infiltrative tumor/lymphoma, extrahepatic cholestasis, with hyperemia in the post-prandial state, and with liver transplantation. Correlation with patient history, laboratory data, and clinical condition recommended. Diagnostic Categories: < or =5 kPa: high probability of being normal < or =9 kPa: in the absence of other known clinical signs, rules out cACLD >9 kPa and ?13 kPa: suggestive of cACLD, but needs further testing >13 kPa: highly suggestive of cACLD > or =17 kPa: highly suggestive of cACLD with an increased probability of clinically significant portal hypertension IMPRESSION: ULTRASOUND LIVER: Fatty infiltration of liver. ULTRASOUND HEPATIC ELASTOGRAPHY: Median kPa:  3.4 Diagnostic category:  < or = 5 kPa: high probability of being normal In the setting of elevated liver function tests, non-fasting state, or vascular congestion, the stage of liver fibrosis may be overestimated. In some patients with NAFLD, the cut-off values for cACLD may be lower (7-9 kPa). In causes other than viral hepatitis and NAFLD, the cut-off values are not well established. Electronically Signed   By: Anna Barnes M.D.   On: 07/30/2023 13:37

## 2023-08-13 ENCOUNTER — Inpatient Hospital Stay (HOSPITAL_BASED_OUTPATIENT_CLINIC_OR_DEPARTMENT_OTHER): Admitting: Nurse Practitioner

## 2023-08-13 DIAGNOSIS — C50212 Malignant neoplasm of upper-inner quadrant of left female breast: Secondary | ICD-10-CM | POA: Diagnosis not present

## 2023-08-13 DIAGNOSIS — Z170 Estrogen receptor positive status [ER+]: Secondary | ICD-10-CM | POA: Diagnosis not present

## 2023-08-13 DIAGNOSIS — E2839 Other primary ovarian failure: Secondary | ICD-10-CM

## 2023-08-13 MED ORDER — EXEMESTANE 25 MG PO TABS: 25.0000 mg | ORAL_TABLET | Freq: Every day | ORAL | 2 refills | Status: DC

## 2023-08-22 ENCOUNTER — Encounter: Payer: Self-pay | Admitting: Internal Medicine

## 2023-08-22 ENCOUNTER — Encounter: Payer: Self-pay | Admitting: Nurse Practitioner

## 2023-08-22 IMAGING — MG MM DIGITAL DIAGNOSTIC UNILAT*L* W/ TOMO W/ CAD
6 of 10 series · 6 of 30 positions shown · non-contrast
Comparison: Previous exam(s).

CLINICAL DATA: 55-year-old female recalled from screening mammogram
dated 01/02/2021 for possible left breast distortion.

EXAM:
DIGITAL DIAGNOSTIC UNILATERAL LEFT MAMMOGRAM WITH TOMOSYNTHESIS AND
CAD; ULTRASOUND LEFT BREAST LIMITED
TECHNIQUE: Left digital diagnostic mammography and breast tomosynthesis was
performed. The images were evaluated with computer-aided detection.;
Targeted ultrasound examination of the left breast was performed.

[L MLO synth-2D (1 of 2)]
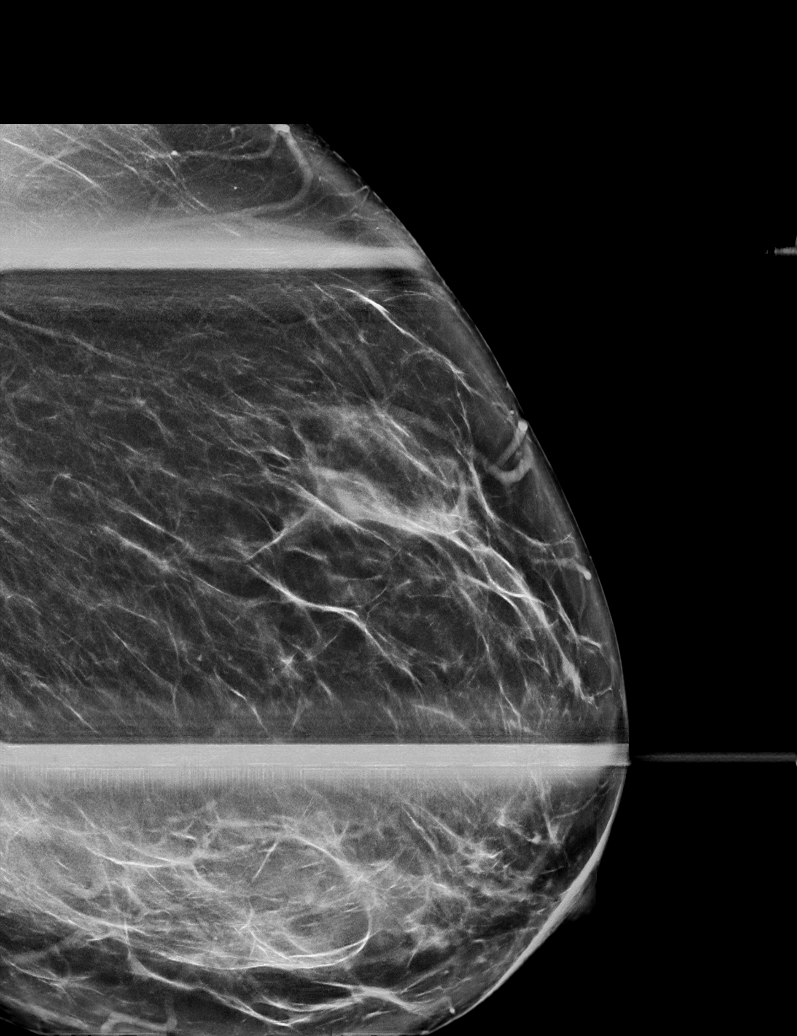

[L MLO synth-2D (2 of 2)]
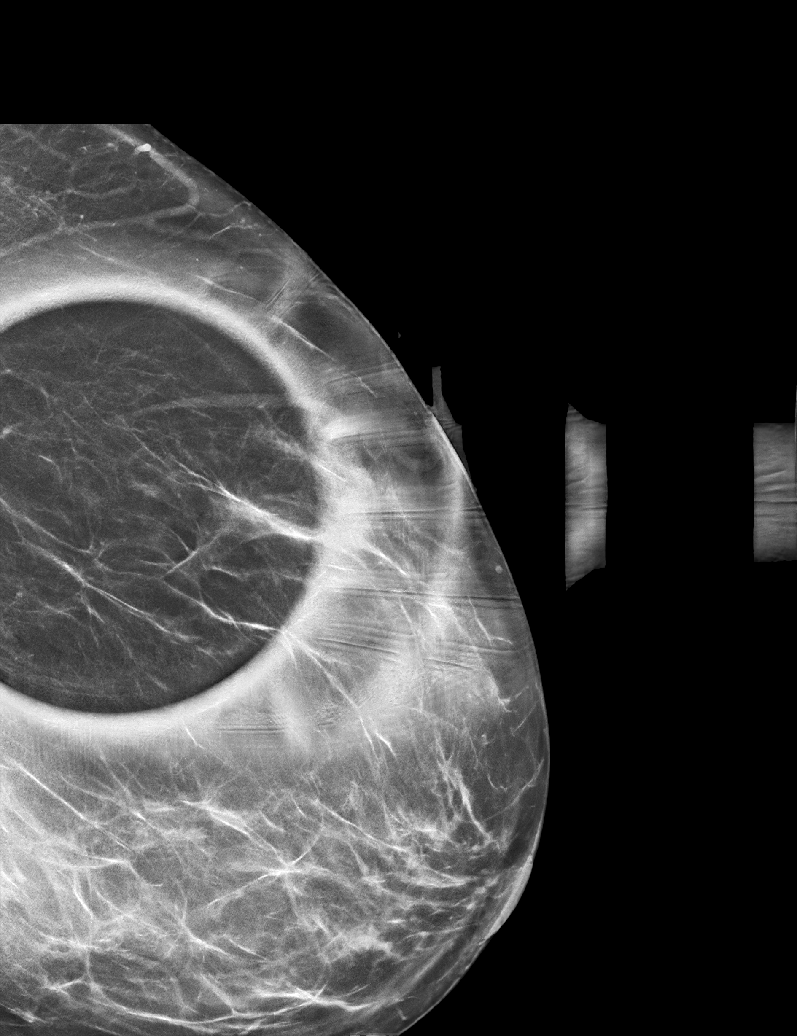

[L CC synth-2D (1 of 2)]
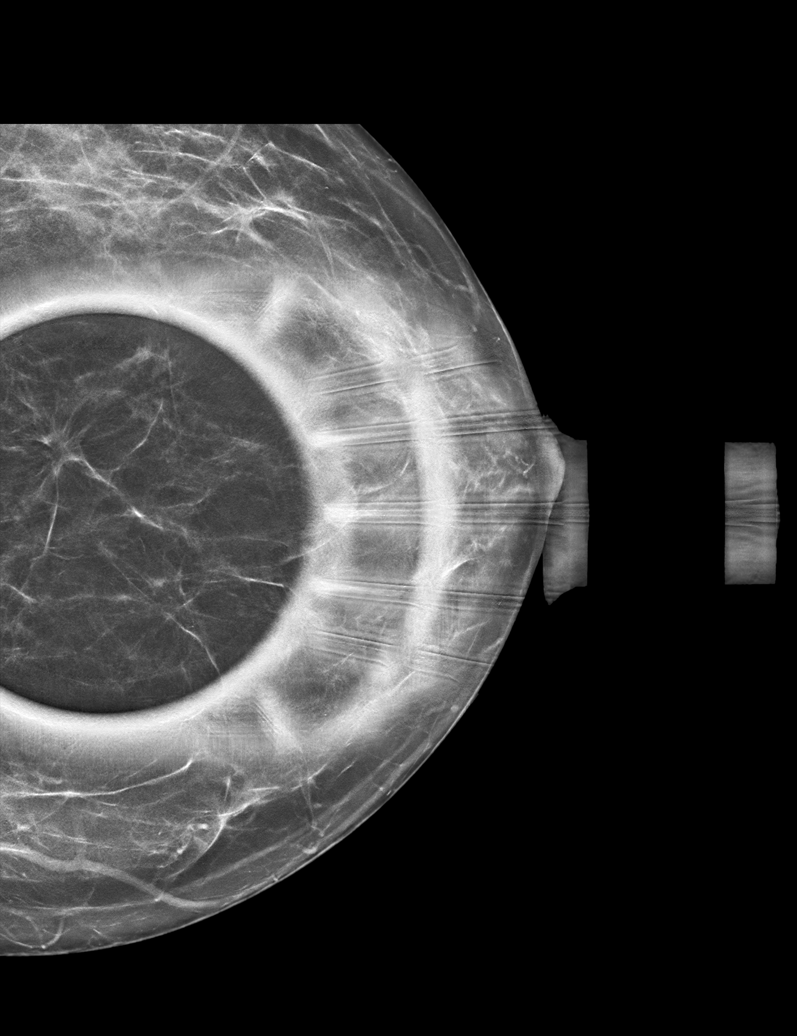

[L ML synth-2D]
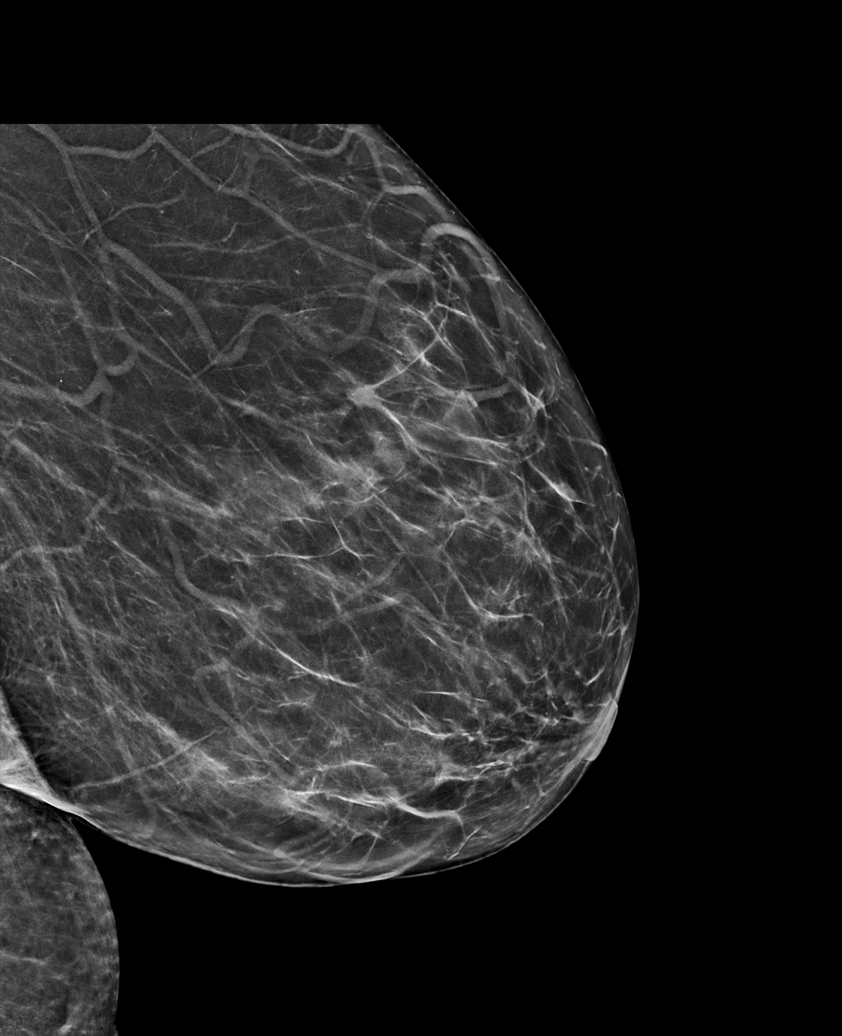

[L CC synth-2D (2 of 2)]
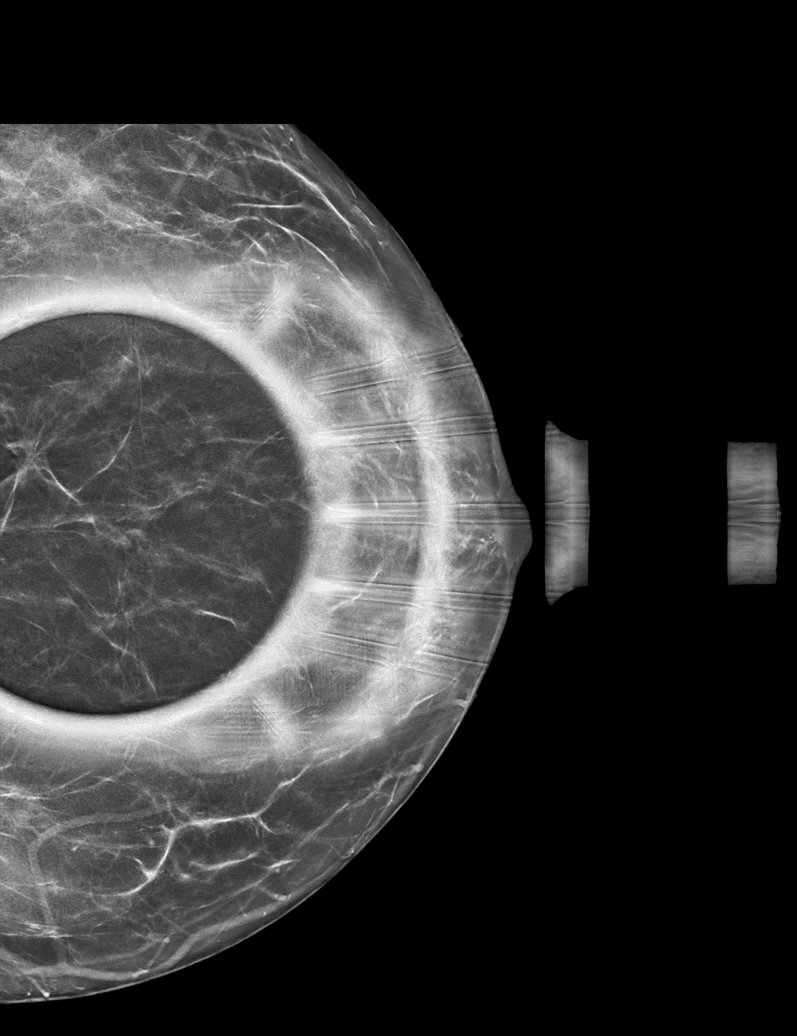

[L MLO tomo · tomo slice 38/75.0]
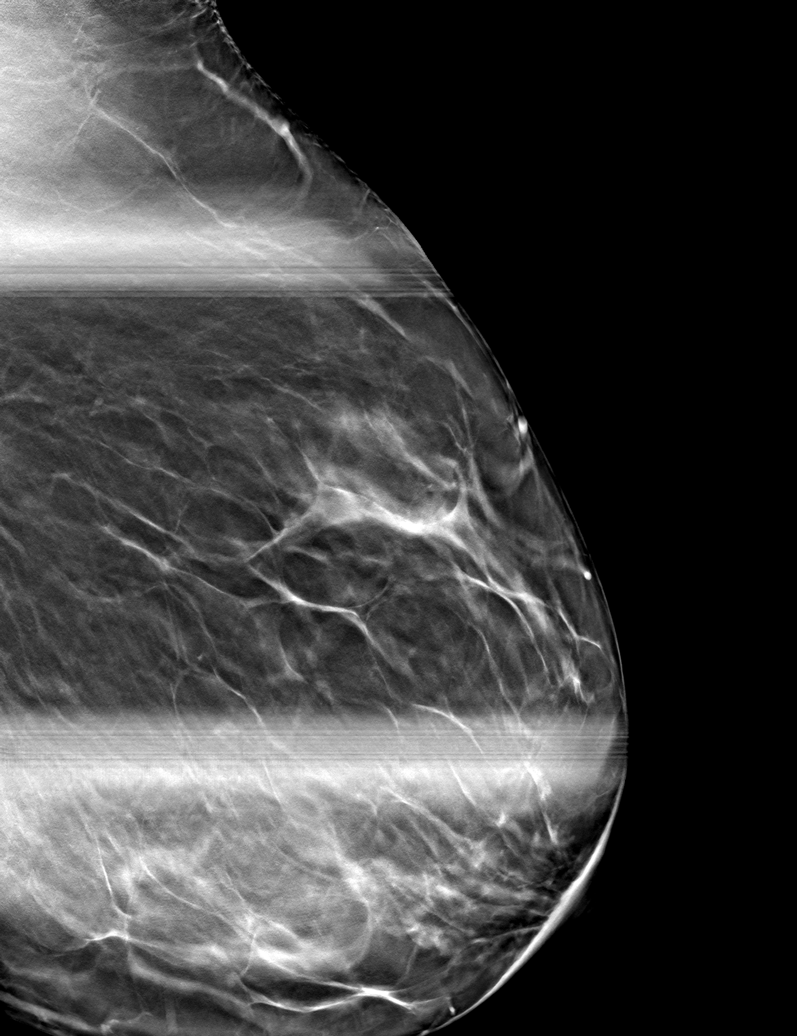

[6 of 30 positions shown; findings below may reference images not displayed]

ACR Breast Density Category b: There are scattered areas of
fibroglandular density.
FINDINGS: There is focal persistent distortion in the superior central left
breast at mid depth. Further evaluation with ultrasound was
performed.

Targeted ultrasound is performed, showing an irregular, hypoechoic
mass with posterior shadowing at the 11 o'clock position 6 cm from
the nipple. It measures 7 x 6 x 6 mm. There is associated
vascularity. Evaluation of the left axilla demonstrates no
suspicious lymphadenopathy.
IMPRESSION: 1. Suspicious left breast mass corresponding with the screening
mammographic findings.
2. No suspicious left axillary lymphadenopathy.

RECOMMENDATION:
Ultrasound-guided biopsy of the left breast.

I have discussed the findings and recommendations with the patient.
If applicable, a reminder letter will be sent to the patient
regarding the next appointment.

BI-RADS CATEGORY  4: Suspicious.

## 2023-09-23 ENCOUNTER — Encounter: Payer: Self-pay | Admitting: Internal Medicine

## 2023-09-23 ENCOUNTER — Other Ambulatory Visit: Payer: Self-pay | Admitting: Nurse Practitioner

## 2023-09-23 DIAGNOSIS — C50212 Malignant neoplasm of upper-inner quadrant of left female breast: Secondary | ICD-10-CM

## 2023-09-23 NOTE — Assessment & Plan Note (Addendum)
 No evidence of malignancy on last mammogram (01/18/2023).  -stopped tamoxifen  due to elevated liver enzymes. Did try to restart however, LFTs went back up.  -Trial exemestane  25 mg daily starting 08/13/2023. -Continue to follow-up with GI. - Patient experienced significant weight gain on exemestane , approximately 25 pounds in 2 months.  Discontinue exemestane  due to side effects. -Trial anastrozole  1 mg daily starting  09/24/2023. -Continue breast cancer surveillance with labs and follow-up in 6 months, sooner if needed.

## 2023-09-23 NOTE — Progress Notes (Unsigned)
 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  -stopped tamoxifen  due to elevated liver enzymes. Did try to restart however, LFTs went back up.  -Trial exemestane  25 mg daily starting 08/13/2023. -Continue to follow-up with GI. -Plan for next mammogram in 01/2024. - Labs and follow-up in 4 to 6 weeks.    The patient understands the plans discussed today and is in agreement with them.  She knows to contact our office if she develops concerns prior to her next appointment.  I provided *** minutes of face-to-face time during this encounter and > 50% was spent counseling as documented under my assessment and plan.    Powell FORBES Lessen, NP  [facility] - A DEPT OF [facility] [address] [place] [address] Dept: [phone] Dept Fax: [phone]   No orders of the defined types were placed in this encounter.     CHIEF COMPLAINT:  CC: Left breast cancer, estrogen receptor positive  Current Treatment: Exemestane  starting 08/13/2023  INTERVAL HISTORY:  Jasmeet is here today for repeat clinical assessment. She denies fevers or chills. She denies pain. Her appetite is good. Her weight {Weight change:10426}.  I have reviewed the past medical history, past surgical history, social history and family history with the patient and they are unchanged from previous note.  ALLERGIES:  is allergic to ibuprofen , morphine , doxepin, nsaids, tramadol, and trazodone.  MEDICATIONS:  Current Outpatient  Medications  Medication Sig Dispense Refill   AJOVY 225 MG/1.5ML SOAJ ADMINISTER 1.5 ML UNDER THE SKIN MONTHLY     albuterol  (PROVENTIL ) (2.5 MG/3ML) 0.083% nebulizer solution Take 2.5 mg by nebulization every 6 (six) hours as needed for wheezing or shortness of breath.     albuterol  (VENTOLIN  HFA) 108 (90 Base) MCG/ACT inhaler Inhale 2 puffs into the lungs every 6 (six) hours as needed for wheezing or shortness of breath.     amphetamine-dextroamphetamine (ADDERALL XR) 30 MG 24 hr capsule Take 30 mg by mouth every morning.     azelastine (ASTELIN) 0.1 % nasal spray Place 1 spray into both nostrils daily as needed for allergies or rhinitis.  12   cetirizine (ZYRTEC) 10 MG tablet Take 10 mg by mouth daily as needed (allergies).     Cyanocobalamin  1000 MCG/ML KIT Inject 1,000 mcg as directed every 30 (thirty) days.     desvenlafaxine (PRISTIQ) 50 MG 24 hr tablet Take 50 mg by mouth daily.     exemestane  (AROMASIN ) 25 MG tablet Take 1 tablet (25 mg total) by mouth daily after breakfast. 30 tablet 2   gabapentin  (NEURONTIN ) 100 MG capsule Take 1 capsule (100 mg total) by mouth at bedtime. OK to increase dose to 3 capsules at night in 2-3 weeks if needed for hot flushes 90 capsule 1   hydrochlorothiazide (MICROZIDE) 12.5 MG capsule Take 12.5 mg by mouth daily.     lamoTRIgine  (LAMICTAL ) 100 MG tablet Take 200 mg by mouth 2 (two) times daily.     metFORMIN  (GLUCOPHAGE ) 500 MG tablet Take 1 tablet (500 mg total) by mouth 2 (two) times daily  with a meal. 180 tablet 0   methocarbamol (ROBAXIN) 500 MG tablet Take 500 mg by mouth.     oxyCODONE  (OXY IR/ROXICODONE ) 5 MG immediate release tablet Take 5 mg by mouth.     topiramate  (TOPAMAX ) 50 MG tablet Take 1 tablet (50 mg total) by mouth 3 (three) times daily. 90 tablet 0   valACYclovir (VALTREX) 1000 MG tablet Take 1,000 mg by mouth daily as needed (cold sores).      Vitamin D , Ergocalciferol , (DRISDOL ) 1.25 MG (50000 UNIT) CAPS capsule Take 1 capsule  (50,000 Units total) by mouth every 7 (seven) days. 4 capsule 0   zolpidem  (AMBIEN  CR) 12.5 MG CR tablet Take 1 tablet (12.5 mg total) by mouth at bedtime. 90 tablet 1   ALPRAZolam  (XANAX ) 0.5 MG tablet Take 0.5 tablets by mouth 2 (two) times daily as needed for anxiety.  (Patient not taking: Reported on 09/24/2023)  0   No current facility-administered medications for this visit.    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) Staging form: Breast, AJCC 8th Edition - Clinical stage from 02/12/2021: Stage IA (cT1b, cN0, cM0, G2, ER+, PR+, HER2-) - Signed by Izell Domino, MD on 02/12/2021 Stage prefix: Initial diagnosis Histologic grading system: 3 grade system - Pathologic stage from 02/25/2021: Stage IA (pT1b, pN0, cM0, G2, ER+, PR+, HER2-, Oncotype DX score: 3) - Signed by Lanny Callander, MD on 05/03/2021 Stage prefix: Initial diagnosis Multigene prognostic tests performed: Oncotype DX Recurrence score range: Less than 11 Histologic grading system: 3 grade system Residual tumor (R): R0 - None     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 Suspicious left breast mass corresponding with the screening mammographic findings. 2. No suspicious left axillary lymphadenopathy.   RECOMMENDATION: Ultrasound-guided biopsy of the left breast.   01/23/2021 Procedure   ULTRASOUND GUIDED LEFT BREAST CORE NEEDLE BIOPSY   IMPRESSION: Ultrasound guided biopsy of a mass in the left breast at 11 o'clock. No apparent complications.   01/23/2021 Pathology Results   Diagnosis Breast, left, needle core biopsy, left breast 11:00, 6cmfn, ribbon clip - INVASIVE MAMMARY CARCINOMA - LOBULAR NEOPLASIA (ATYPICAL LOBULAR HYPERPLASIA) - SEE COMMENT Microscopic Comment the biopsy material shows an infiltrative proliferation of cells arranged  linearly and in small clusters. Based on the biopsy, the carcinoma appears Nottingham grade 2 of 3 and measures 0.4 cm in greatest linear extent.  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 BREAST, LEFT ADDITIONAL ANTERIOSUPERIOR MARGIN, EXCISION:  - Benign breast parenchyma, negative  for carcinoma   C. BREAST, LEFT ADDITIONAL MEDIOPOSTERIOR MARGIN, EXCISION:  - Benign breast parenchyma, negative for carcinoma   D. BREAST, LEFT ADDITIONAL INFERIOLATERAL MARGIN, EXCISION:  - Benign breast parenchyma, negative for carcinoma   E. LYMPH NODE, LEFT AXILLARY, SENTINEL, EXCISION:  - Lymph node, negative for carcinoma (0/1)   F. LYMPH NODE, LEFT AXILLARY, SENTINEL, EXCISION:  - Lymph node, negative for carcinoma (0/1)   G. LYMPH NODE, LEFT AXILLARY, SENTINEL, EXCISION:  - Lymph node, negative for carcinoma (0/1)    02/25/2021 Oncotype testing   Recurrence score of 4, predicts a risk of recurrence outside the breast over the next 9 years of 3%    02/25/2021 Cancer Staging   Staging form: Breast, AJCC 8th Edition -  Pathologic stage from 02/25/2021: Stage IA (pT1b, pN0, cM0, G2, ER+, PR+, HER2-, Oncotype DX score: 3) - Signed by Lanny Callander, MD on 05/03/2021 Stage prefix: Initial diagnosis Multigene prognostic tests performed: Oncotype DX Recurrence score range: Less than 11 Histologic grading system: 3 grade system Residual tumor (R): R0 - None   04/03/2021 - 05/05/2021 Radiation Therapy   Site Technique Total Dose (Gy) Dose per Fx (Gy) Completed Fx Beam Energies  Breast, Left: Breast_L 3D 40.05/40.05 2.67 15/15 10X, 15X  Breast, Left: Breast_L_Bst 3D 10/10 2 5/5 6X, 10X     05/2021 -  Anti-estrogen oral therapy   Tamoxifen  daily       REVIEW OF SYSTEMS:   Constitutional: Denies fevers, chills or abnormal weight loss Eyes: Denies blurriness of vision Ears, nose, mouth, throat, and face: Denies mucositis or sore throat Respiratory: Denies cough, dyspnea or wheezes Cardiovascular: Denies palpitation, chest discomfort or lower extremity swelling Gastrointestinal:  Denies nausea, heartburn or change in bowel habits Skin: Denies abnormal skin rashes Lymphatics: Denies new lymphadenopathy or easy bruising Neurological:Denies numbness, tingling or new weaknesses Behavioral/Psych: Mood is stable, no new changes  All other systems were reviewed with the patient and are negative.   VITALS:  Blood pressure 138/88, pulse 89, temperature 98 F (36.7 C), resp. rate 17, weight 270 lb 8 oz (122.7 kg), last menstrual period 07/20/2008, SpO2 97%.  Wt Readings from Last 3 Encounters:  09/24/23 270 lb 8 oz (122.7 kg)  08/11/23 254 lb 9.6 oz (115.5 kg)  06/24/23 253 lb (114.8 kg)    Body mass index is 45.01 kg/m.  Performance status (ECOG): {CHL ONC [id number]  PHYSICAL EXAM:   GENERAL:alert, no distress and comfortable SKIN: skin color, texture, turgor are normal, no rashes or significant lesions EYES: normal, Conjunctiva are pink and non-injected, sclera clear OROPHARYNX:no exudate, no erythema  and lips, buccal mucosa, and tongue normal  NECK: supple, thyroid  normal size, non-tender, without nodularity LYMPH:  no palpable lymphadenopathy in the cervical, axillary or inguinal LUNGS: clear to auscultation and percussion with normal breathing effort HEART: regular rate & rhythm and no murmurs and no lower extremity edema ABDOMEN:abdomen soft, non-tender and normal bowel sounds Musculoskeletal:no cyanosis of digits and no clubbing  NEURO: alert & oriented x 3 with fluent speech, no focal motor/sensory deficits  LABORATORY DATA:  I have reviewed the data as listed    Component Value Date/Time   NA 142 09/24/2023 1029   NA 145 (H) 04/29/2023 1146   NA 141 09/26/2013 1414   K 3.5 09/24/2023 1029   K 3.2 (L) 09/26/2013 1414   CL 103 09/24/2023 1029   CL 102 09/26/2013 1414   CO2 33 (H) 09/24/2023 1029   CO2 31 09/26/2013 1414  GLUCOSE 87 09/24/2023 1029   GLUCOSE 85 09/26/2013 1414   BUN 13 09/24/2023 1029   BUN 12 04/29/2023 1146   BUN 10 09/26/2013 1414   CREATININE 0.68 09/24/2023 1029   CREATININE 0.71 09/26/2013 1414   CALCIUM 9.2 09/24/2023 1029   CALCIUM 8.9 09/26/2013 1414   PROT 7.0 09/24/2023 1029   PROT 6.4 04/29/2023 1146   PROT 7.5 09/26/2013 1414   ALBUMIN 3.8 09/24/2023 1029   ALBUMIN 4.0 04/29/2023 1146   ALBUMIN 3.5 09/26/2013 1414   AST 25 09/24/2023 1029   ALT 38 09/24/2023 1029   ALT 27 09/26/2013 1414   ALKPHOS 154 (H) 09/24/2023 1029   ALKPHOS 72 09/26/2013 1414   BILITOT 0.2 09/24/2023 1029   GFRNONAA >60 09/24/2023 1029   GFRNONAA >60 09/26/2013 1414   GFRAA >60 05/06/2017 1117   GFRAA >60 09/26/2013 1414    No results found for: SPEP, UPEP  Lab Results  Component Value Date   WBC 5.8 09/24/2023   NEUTROABS 2.9 09/24/2023   HGB 13.4 09/24/2023   HCT 41.5 09/24/2023   MCV 87.2 09/24/2023   PLT 275 09/24/2023      Chemistry      Component Value Date/Time   NA 142 09/24/2023 1029   NA 145 (H) 04/29/2023 1146   NA 141  09/26/2013 1414   K 3.5 09/24/2023 1029   K 3.2 (L) 09/26/2013 1414   CL 103 09/24/2023 1029   CL 102 09/26/2013 1414   CO2 33 (H) 09/24/2023 1029   CO2 31 09/26/2013 1414   BUN 13 09/24/2023 1029   BUN 12 04/29/2023 1146   BUN 10 09/26/2013 1414   CREATININE 0.68 09/24/2023 1029   CREATININE 0.71 09/26/2013 1414      Component Value Date/Time   CALCIUM 9.2 09/24/2023 1029   CALCIUM 8.9 09/26/2013 1414   ALKPHOS 154 (H) 09/24/2023 1029   ALKPHOS 72 09/26/2013 1414   AST 25 09/24/2023 1029   ALT 38 09/24/2023 1029   ALT 27 09/26/2013 1414   BILITOT 0.2 09/24/2023 1029       RADIOGRAPHIC STUDIES: I have personally reviewed the radiological images as listed and agreed with the findings in the report. No results found.

## 2023-09-24 ENCOUNTER — Inpatient Hospital Stay (HOSPITAL_BASED_OUTPATIENT_CLINIC_OR_DEPARTMENT_OTHER): Admitting: Nurse Practitioner

## 2023-09-24 ENCOUNTER — Inpatient Hospital Stay: Attending: Adult Health

## 2023-09-24 VITALS — BP 138/88 | HR 89 | Temp 98.0°F | Resp 17 | Wt 270.5 lb

## 2023-09-24 DIAGNOSIS — Z170 Estrogen receptor positive status [ER+]: Secondary | ICD-10-CM | POA: Insufficient documentation

## 2023-09-24 DIAGNOSIS — R635 Abnormal weight gain: Secondary | ICD-10-CM | POA: Insufficient documentation

## 2023-09-24 DIAGNOSIS — Z79899 Other long term (current) drug therapy: Secondary | ICD-10-CM | POA: Insufficient documentation

## 2023-09-24 DIAGNOSIS — Z1732 Human epidermal growth factor receptor 2 negative status: Secondary | ICD-10-CM | POA: Diagnosis not present

## 2023-09-24 DIAGNOSIS — C50212 Malignant neoplasm of upper-inner quadrant of left female breast: Secondary | ICD-10-CM | POA: Insufficient documentation

## 2023-09-24 DIAGNOSIS — Z79811 Long term (current) use of aromatase inhibitors: Secondary | ICD-10-CM | POA: Insufficient documentation

## 2023-09-24 DIAGNOSIS — E538 Deficiency of other specified B group vitamins: Secondary | ICD-10-CM

## 2023-09-24 DIAGNOSIS — Z1721 Progesterone receptor positive status: Secondary | ICD-10-CM | POA: Diagnosis not present

## 2023-09-24 LAB — CBC WITH DIFFERENTIAL (CANCER CENTER ONLY)
Abs Immature Granulocytes: 0.02 K/uL (ref 0.00–0.07)
Basophils Absolute: 0.1 K/uL (ref 0.0–0.1)
Basophils Relative: 1 %
Eosinophils Absolute: 0.3 K/uL (ref 0.0–0.5)
Eosinophils Relative: 4 %
HCT: 41.5 % (ref 36.0–46.0)
Hemoglobin: 13.4 g/dL (ref 12.0–15.0)
Immature Granulocytes: 0 %
Lymphocytes Relative: 37 %
Lymphs Abs: 2.1 K/uL (ref 0.7–4.0)
MCH: 28.2 pg (ref 26.0–34.0)
MCHC: 32.3 g/dL (ref 30.0–36.0)
MCV: 87.2 fL (ref 80.0–100.0)
Monocytes Absolute: 0.4 K/uL (ref 0.1–1.0)
Monocytes Relative: 7 %
Neutro Abs: 2.9 K/uL (ref 1.7–7.7)
Neutrophils Relative %: 51 %
Platelet Count: 275 K/uL (ref 150–400)
RBC: 4.76 MIL/uL (ref 3.87–5.11)
RDW: 13.3 % (ref 11.5–15.5)
WBC Count: 5.8 K/uL (ref 4.0–10.5)
nRBC: 0 % (ref 0.0–0.2)

## 2023-09-24 LAB — CMP (CANCER CENTER ONLY)
ALT: 38 U/L (ref 0–44)
AST: 25 U/L (ref 15–41)
Albumin: 3.8 g/dL (ref 3.5–5.0)
Alkaline Phosphatase: 154 U/L — ABNORMAL HIGH (ref 38–126)
Anion gap: 6 (ref 5–15)
BUN: 13 mg/dL (ref 6–20)
CO2: 33 mmol/L — ABNORMAL HIGH (ref 22–32)
Calcium: 9.2 mg/dL (ref 8.9–10.3)
Chloride: 103 mmol/L (ref 98–111)
Creatinine: 0.68 mg/dL (ref 0.44–1.00)
GFR, Estimated: >60 mL/min (ref >60)
Glucose, Bld: 87 mg/dL (ref 70–99)
Potassium: 3.5 mmol/L (ref 3.5–5.1)
Sodium: 142 mmol/L (ref 135–145)
Total Bilirubin: 0.2 mg/dL (ref 0.0–1.2)
Total Protein: 7 g/dL (ref 6.5–8.1)

## 2023-09-24 LAB — VITAMIN B12: Vitamin B-12: 492 pg/mL (ref 180–914)

## 2023-09-24 LAB — IRON AND IRON BINDING CAPACITY (CC-WL,HP ONLY)
Iron: 38 ug/dL (ref 28–170)
Saturation Ratios: 8 % — ABNORMAL LOW (ref 10.4–31.8)
TIBC: 454 ug/dL — ABNORMAL HIGH (ref 250–450)
UIBC: 416 ug/dL (ref 148–442)

## 2023-09-24 LAB — FERRITIN: Ferritin: 11 ng/mL (ref 11–307)

## 2023-09-24 MED ORDER — ANASTROZOLE 1 MG PO TABS: 1.0000 mg | ORAL_TABLET | Freq: Every day | ORAL | 2 refills | Status: AC

## 2023-09-26 ENCOUNTER — Encounter: Payer: Self-pay | Admitting: Nurse Practitioner

## 2023-09-26 ENCOUNTER — Encounter: Payer: Self-pay | Admitting: Internal Medicine

## 2023-09-27 ENCOUNTER — Telehealth: Payer: Self-pay | Admitting: Nurse Practitioner

## 2023-09-27 NOTE — Telephone Encounter (Signed)
 Scheduled appointments per 7/11 los. Talked with the patient and she is aware of the made appointments.

## 2023-09-28 ENCOUNTER — Telehealth: Payer: Self-pay

## 2023-09-28 NOTE — Telephone Encounter (Signed)
 Called patient to relay results below as per Powell Lessen NP and they were given in a secure chat. Patient voiced full understanding and had no further questions.   I just reviewed her labs. her ferritin is on low/normal side. B12 is also low/normal. I recommend that she start taking an OTC iron  supplement (ferrusol) and a B12 vitamin daily. b12 should be about 1000 mcg daily. we are rechecking her labs on 11/03/2023. that will be a perfect time to see if the oral supplements are working.  Thank you.  Karyle

## 2023-10-04 ENCOUNTER — Encounter: Payer: Self-pay | Admitting: Internal Medicine

## 2023-10-05 ENCOUNTER — Telehealth: Payer: Self-pay

## 2023-10-05 NOTE — Telephone Encounter (Signed)
 Notified the pt regarding her forms being completed. Pt verbalize understanding. No questions or concerns to be noted at this time.

## 2023-10-11 NOTE — Therapy (Incomplete)
 OUTPATIENT PHYSICAL THERAPY VESTIBULAR EVALUATION  Patient Name: Tracy [name] MRN: [id number] DOB:09-08-1965, 58 y.o., female Today's Date: 10/11/2023  END OF SESSION:   Past Medical History:  Diagnosis Date   ADHD    Alcohol abuse    Anemia    Anxiety    Anxiety and depression    Arthritis    left knee   Asthma    hardly uses inhaler   B12 deficiency    Cancer (HCC)    Constipation    Depression    Diverticulosis    Fatty liver 2012   pt states she was told this once   Headache    Migraines   Heart murmur    pt was told this, no echo   History of stomach ulcers    Hypertension    history no longer requires medication management   IBS (irritable bowel syndrome)    IDA (iron  deficiency anemia)    Infertility, female    Joint pain    Lactose intolerance    Melasma    Palpitations    Perforated ulcer (HCC)    Pre-diabetes    PUD (peptic ulcer disease)    Sleep apnea    mild, no CPAP ordered   SOBOE (shortness of breath on exertion)    Vitamin D  deficiency    Past Surgical History:  Procedure Laterality Date   ABDOMINAL HYSTERECTOMY     ANKLE RECONSTRUCTION Left 09/09/2018   Procedure: BROSTRUM-GOULD LEFT;  Surgeon: Tracy [name], DPM;  Location: [facility];  Service: Podiatry;  Laterality: Left;   BREAST LUMPECTOMY WITH RADIOACTIVE SEED AND SENTINEL LYMPH NODE BIOPSY Left 02/25/2021   Procedure: LEFT BREAST LUMPECTOMY WITH RADIOACTIVE SEED AND SENTINEL LYMPH NODE BIOPSY;  Surgeon: Tracy Ned, MD;  Location: [facility];  Service: General;  Laterality: Left;   CHOLECYSTECTOMY     COLONOSCOPY     CYSTOSCOPY N/A 08/05/2016   Procedure: CYSTOSCOPY;  Surgeon: Tracy Browning, MD;  Location: [facility];  Service: Gynecology;  Laterality: N/A;   ENDOMETRIAL ABLATION W/ NOVASURE     GASTRIC BYPASS OPEN     LYSIS OF ADHESION N/A 08/05/2016   Procedure: LYSIS OF ADHESION;  Surgeon: Tracy Browning, MD;  Location: [facility];  Service: Gynecology;  Laterality: N/A;    perforated ulcer surgery     ROBOTIC ASSISTED SALPINGO OOPHERECTOMY Right 05/12/2017   Procedure: XI ROBOTIC ASSISTED RIGHT OOPHORECTOMY, Fulgeration of Endometriosis, Peritoneal Biopsy;  Surgeon: Tracy Browning, MD;  Location: [facility];  Service: Gynecology;  Laterality: Right;   ROBOTIC ASSISTED TOTAL HYSTERECTOMY WITH SALPINGECTOMY Bilateral 08/05/2016   Procedure: ROBOTIC ASSISTED TOTAL HYSTERECTOMY WITH SALPINGECTOMY;  Surgeon: Tracy Browning, MD;  Location: [facility];  Service: Gynecology;  Laterality: Bilateral;   TENDON REPAIR Left 09/09/2018   Procedure: TENOLYSIS, MULTIPLE;  Surgeon: Tracy [name], DPM;  Location: [facility];  Service: Podiatry;  Laterality: Left;   TONSILLECTOMY     UPPER GI ENDOSCOPY     Patient Active Problem List   Diagnosis Date Noted   BMI 38.0-38.9,adult 03/04/2023   BMI 45.0-49.9, adult (HCC) 08/19/2022   Obesity, Beginning BMI 50.59 08/19/2022   Closed nondisplaced fracture of sixth cervical vertebra with routine healing 04/29/2022   Polyphagia 04/01/2022   Hyperglycemia 02/23/2022   Elevated blood pressure reading 02/23/2022   Depression with anxiety 12/31/2021   Insulin  resistance 12/03/2021   Other constipation 12/03/2021   Other fatigue 11/19/2021   SOBOE (shortness of breath on exertion) 11/19/2021   Depression screening 11/19/2021   Hypertension  11/19/2021   Depression 11/19/2021   Vitamin D  deficiency 11/19/2021   Prediabetes 11/14/2021   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   Lactose intolerance 11/14/2021   Class 3 severe obesity with serious comorbidity and body mass index (BMI) of 50.0 to 59.9 in adult 11/14/2021   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 (HCC) 02/12/2021   Lymphedema 10/04/2017   Vitamin B12 deficiency 06/11/2015   Anxiety and depression 01/29/2015   Airway hyperreactivity 01/29/2015   Gastroduodenal ulcer 01/29/2015   Other iron  deficiency anemia 01/29/2015   H/O infectious disease 12/01/2013   DD (diverticular  disease) 10/02/2013   Cardiac murmur 09/12/2013   Chloasma 09/12/2013   PCP: Tracy Perkins, PA-C  REFERRING PROVIDER: Janith Drivers, DO   REFERRING DIAG: H81.90 (ICD-10-CM) - Vestibulopathy M54.2 (ICD-10-CM) - Neck pain  RATIONALE FOR EVALUATION AND TREATMENT: Rehabilitation  THERAPY DIAG: No diagnosis found.  ONSET DATE: ***  FOLLOW-UP APPT SCHEDULED WITH REFERRING PROVIDER: {yes/no:20286}    SUBJECTIVE:   Chief Complaint:  ***  Pertinent History Pt is a 58 y.o. year old female with: What appears to be persistent concussion - Domains affected include vision, vestibular, cervicogenic, mood  PLAN:   - We will start her in a vestibular physical therapy program. She lives in [place] and we will send her to [facility] health. - If no significant benefit, would likely start have her see a functional vision expert - Will also have her see Dr. Ozzie  - Encouraged the patient to stay active.   - No follow-ups on file.  - Discussed at length today about her problem.  SUBJECTIVE:   Chief Complaint:  Neck Pain (Patient reports has had a broken neck and has had many types of treatment but their pain is not getting better. )  History of Present Illness:  Ms. Tracy [name] is a 58 y.o. year old female seen as a new evaluation regarding neck pain.. Ms. Tracy [name] states that she had been dealing with neck pain since an accident about a year and a half ago. She slipped and fell forward in the bathroom and hit her head on the toilet. She is not sure if she lost consciousness or not. She does remember feeling very dizzy at the time. Since then she has had dizziness, blurry vision, double vision, trouble reading, significant neck pain. Her mood is also been labile. She is seeing physicians in [place], [place] and here at [facility]. She has had several injections with no significant benefit.  She has had imaging that is viewable in the [facility] system today.. I reviewed the MRI of the cervical spine  today.  Cervical MRI 08/30/23 FINDINGS:  Bone marrow signal intensity is normal. No marrow edema or displacement associated with remote right C6 and C7 articular pillar fractures. Normal signal in the spinal cord.   2 mm anterolisthesis of C5 on C6 and 3 mm anterolisthesis of C6 on C7. Multilevel intervertebral disc desiccation and height loss.   C2-C3: Bilateral facet arthropathy. No significant spinal canal or neural foraminal narrowing.   C3-C4: Posterior disc bulge. Bilateral uncovertebral spurring and facet arthropathy. Minimal spinal canal narrowing. Mild left neural foraminal narrowing.   C4-C5: No spinal canal narrowing. Bilateral facet arthrosis. No neural foraminal narrowing.   C5-C6: Small posterior disc bulge with anterolisthesis of C5 on C6. Slight uncovertebral spurring. No significant spinal canal or neural foraminal narrowing.   C6-C7: Anterolisthesis of C6 on C7 with posterior disc bulge. Bilateral facet arthropathy. Mild spinal canal narrowing. Moderate right neural foraminal  narrowing.   C7-T1: No spinal canal narrowing. No neural foraminal narrowing.   There is a T2 hyperintense nodule in the right neck at the level of the hypopharynx, posterior to the right thyroid  lobe, just lateral to the esophagus, and anterior to the right longus colli muscle, which is indeterminate, but which appears stable in retrospect from prior CT of the cervical spine from 04/06/2022.   The paraspinal tissues are within normal limits.   Brain MRI 01/11/23: IMPRESSION:  1. No acute intracranial abnormality.  2.  Grossly normal appearance of the bilateral olfactory bulbs.  3.  Scattered T2 FLAIR hyperintense foci within the periventricular and subcortical white matter, which are nonspecific but typically attributed to the sequela of prior trauma/inflammation/demyelination or chronic ischemia associated with migraine/atherosclerosis.    Description of dizziness: Frequency:   Duration: Symptom nature: {DizzyNature:27369} Progression of symptoms since onset: {DizzyProgress:27370} History of similar episodes: {yes/no:20286}  Provocative Factors: Easing Factors:  Auditory complaints (tinnitus, pain, drainage, hearing loss, aural fullness): {yes/no:20286} Vision changes (diplopia, visual field loss, recent changes, recent eye exam): {yes/no:20286} Chest pain/palpitations: {yes/no:20286} History of head injury/concussion: {yes/no:20286} Stress/anxiety: {yes/no:20286} Migraines/headaches: {yes/no:20286} Nausea/vomiting: {yes/no:20286} Numbness/tingling: {yes/no:20286} Focal weakness: {yes/no:20286} Dysarthria/dysphagia/drop attacks: {yes/no:20286}  Has patient fallen in last 6 months? {yes/no:20286}, Number of falls: *** Pertinent pain: {yes/no:20286} Dominant hand: {RIGHT/LEFT:20294} Imaging: {yes/no:20286}  Prior level of function: {PLOF:24004} Occupational demands: Hobbies:  Red Flags: Negative for bowel/bladder changes, personal history of cancer, chills/fever, night sweats, unexplained weight loss/gain  PRECAUTIONS: {Therapy precautions:24002}  WEIGHT BEARING RESTRICTIONS {Yes ***/No:24003}  LIVING ENVIRONMENT: Lives with: {[facility] lives with:25569::lives with their family} Lives in: {Lives in:25570} Stairs: {yes/no:20286}; {Stairs:24000} Has following equipment at home: {Assistive devices:23999}  PATIENT GOALS ***   OBJECTIVE EXAMINATION  POSTURE: No gross deficits contributing to symptoms  NEUROLOGICAL SCREEN: (2+ unless otherwise noted.) N=normal  Ab=abnormal  Level Dermatome R L Myotome R L Reflex R L  C3 Anterior Neck N N Sidebend C2-3 N N Jaw CN V    C4 Top of Shoulder N N Shoulder Shrug C4 N N Hoffman's UMN    C5 Lateral Upper Arm N N Shoulder ABD C4-5 N N Biceps C5-6    C6 Lateral Arm/ Thumb N N Arm Flex/ Wrist Ext C5-6 N N Brachiorad. C5-6    C7 Middle Finger N N Arm Ext//Wrist Flex C6-7 N N Triceps C7    C8 4th & 5th Finger  N N Flex/ Ext Carpi Ulnaris C8 N N Patellar (L3-4)    T1 Medial Arm N N Interossei T1 N N Gastrocnemius    L2 Medial thigh/groin N N Illiopsoas (L2-3) N N     L3 Lower thigh/med.knee N N Quadriceps (L3-4) N N     L4 Medial leg/lat thigh N N Tibialis Ant (L4-5) N N     L5 Lat. leg & dorsal foot N N EHL (L5) N N     S1 post/lat foot/thigh/leg N N Gastrocnemius (S1-2) N N     S2 Post./med. thigh & leg N N Hamstrings (L4-S3) N N      CRANIAL NERVES II, III, IV, VI: Pupils equal and reactive to light, visual acuity and visual fields are intact, extraocular muscles are intact  V: Facial sensation is intact and symmetric bilaterally  VII: Facial strength is intact and symmetric bilaterally  VIII: Hearing is normal as tested by gross conversation IX, X: Palate elevates midline, normal phonation, uvula midline XI: Shoulder shrug strength is intact  XII: Tongue protrudes midline   COORDINATION  Finger to Nose: Normal Heel to Shin: Normal Pronator Drift: Negative Rapid Alternating Movements: Normal Finger to Thumb Opposition: Normal   RANGE OF MOTION Cervical Spine AROM WFL and painless in all planes. No functional focal deficits in AROM noted in BUE/BLE  MANUAL MUSCLE TESTING BUE/BLE strength WNL without focal deficits  TRANSFERS/GAIT Independent for transfers and ambulation without assistive device   PATIENT SURVEYS FOTO: ,predicted improvement to  ABC: % DHI: /100  OCULOMOTOR / VESTIBULAR TESTING  Oculomotor Exam- Room Light  Findings Comments  Ocular Alignment {normal/abnormal/not examined:14677}   Ocular ROM {normal/abnormal/not examined:14677}   Spontaneous Nystagmus {normal/abnormal/not examined:14677}   Gaze-Holding Nystagmus {normal/abnormal/not examined:14677}   End-Gaze Nystagmus {normal/abnormal/not examined:14677}   Vergence (normal 2-3) {normal/abnormal/not examined:14677}   Smooth Pursuit {normal/abnormal/not examined:14677}   Cross-Cover Test  {normal/abnormal/not examined:14677}   Saccades {normal/abnormal/not examined:14677}   VOR Cancellation {normal/abnormal/not examined:14677}   Left Head Impulse {normal/abnormal/not examined:14677}   Right Head Impulse {normal/abnormal/not examined:14677}   Static Acuity {normal/abnormal/not examined:14677}   Dynamic Acuity {normal/abnormal/not examined:14677}    Oculomotor Exam- Fixation Suppressed  Findings Comments  Ocular Alignment {normal/abnormal/not examined:14677}   Spontaneous Nystagmus {normal/abnormal/not examined:14677}   Gaze-Holding Nystagmus {normal/abnormal/not examined:14677}   End-Gaze Nystagmus {normal/abnormal/not examined:14677}   Head Shaking Nystagmus {normal/abnormal/not examined:14677}   Pressure-Induced Nystagmus {normal/abnormal/not examined:14677}   Hyperventilation Induced Nystagmus {normal/abnormal/not examined:14677}   Skull Vibration Induced Nystagmus {normal/abnormal/not examined:14677}    BPPV TESTS:  Symptoms Duration Intensity Nystagmus  L Dix-Hallpike None   None  R Dix-Hallpike None   None  L Head Roll None   None  R Head Roll None   None  L Sidelying Test      R Sidelying Test      (blank = not tested)  Clinical Test of Sensory Interaction for Balance (CTSIB): CONDITION TIME SWAY  Eyes open, firm surface 30 seconds   Eyes closed, firm surface 30 seconds   Eyes open, foam surface 30 seconds   Eyes closed, foam surface 30 seconds     FUNCTIONAL OUTCOME MEASURES  Results Comments  BERG    DGI    FGA    TUG    5TSTS    6 Minute Walk Test    10 Meter Gait Speed    (blank = not tested)   TODAY'S TREATMENT  Deferred   PATIENT EDUCATION:  Education details: Plan of care Person educated: Patient Education method: Explanation Education comprehension: verbalized understanding   HOME EXERCISE PROGRAM:  None currently   ASSESSMENT: CLINICAL IMPRESSION: Patient is a *** y.o. *** who was seen today for physical therapy  evaluation and treatment for ***.   OBJECTIVE IMPAIRMENTS: {opptimpairments:25111}.   ACTIVITY LIMITATIONS: {activitylimitations:27494}  PARTICIPATION LIMITATIONS: {participationrestrictions:25113}  PERSONAL FACTORS: {Personal factors:25162} are also affecting patient's functional outcome.   REHAB POTENTIAL: {rehabpotential:25112}  CLINICAL DECISION MAKING: {clinical decision making:25114}  EVALUATION COMPLEXITY: {Evaluation complexity:25115}   GOALS:  SHORT TERM GOALS: Target date: {follow up:25551}  Pt will be independent with HEP for dizziness in order to decrease symptoms, improve balance,decrease fall risk, and improve function at home. Baseline: Goal status: INITIAL   LONG TERM GOALS: Target date: {follow up:25551}  Pt will increase FOTO to at least *** to demonstrate significant improvement in function at home related to dizziness.  Baseline:  Goal status: {GOALSTATUS:25110}  2.  Pt will decrease DHI score by at least 18 points in order to demonstrate clinically significant reduction in disability related to dizziness.  Baseline: *** Goal status: {GOALSTATUS:25110}  3.  Pt will improve ABC  by at least 13% in order to demonstrate clinically significant improvement in balance confidence.      Baseline: *** Goal status: {GOALSTATUS:25110}  4. Pt will decrease 5TSTS by at least 3 seconds in order to demonstrate clinically significant improvement in LE strength      Baseline: *** Goal status: {GOALSTATUS:25110}  5. Pt will improve DGI by at least 3 points in order to demonstrate clinically significant improvement in balance and decreased risk for falls.     Baseline: *** Goal status: {GOALSTATUS:25110}  6. Pt will decrease TUG to below 14 seconds/decrease in order to demonstrate decreased fall risk.  Baseline: *** Goal status: {GOALSTATUS:25110}  7. Pt will increase by at least 67m (153ft) in order to demonstrate clinically significant improvement in  cardiopulmonary endurance and community ambulation   Baseline: *** Goal status: {GOALSTATUS:25110}   PLAN: PT FREQUENCY: 1x/week  PT DURATION: 8 weeks  PLANNED INTERVENTIONS: Therapeutic exercises, Therapeutic activity, Neuromuscular re-education, Balance training, Gait training, Patient/Family education, Self Care, Joint mobilization, Joint manipulation, Vestibular training, Canalith repositioning, Orthotic/Fit training, DME instructions, Dry Needling, Electrical stimulation, Spinal manipulation, Spinal mobilization, Cryotherapy, Moist heat, Taping, Traction, Ultrasound, Ionotophoresis 4mg /ml Dexamethasone , Manual therapy, and Re-evaluation.  PLAN FOR NEXT SESSION: ***   Selinda BIRCH Josten Warmuth PT, DPT, GCS  Syvanna Ciolino, PT 10/11/2023, 11:25 AM

## 2023-10-12 ENCOUNTER — Ambulatory Visit

## 2023-10-14 ENCOUNTER — Encounter

## 2023-10-16 NOTE — Therapy (Unsigned)
 OUTPATIENT PHYSICAL THERAPY VESTIBULAR EVALUATION  Patient Name: Tracy [name] MRN: [id number] DOB:[birth year], 58 y.o., female Today's Date: 10/20/2023  END OF SESSION:  PT End of Session - 10/20/23 2036     Visit Number 1    Number of Visits 9    Date for PT Re-Evaluation 12/14/23    Authorization Type eval: 10/19/23    PT Start Time 1020    PT Stop Time 1055    PT Time Calculation (min) 35 min    Activity Tolerance Patient tolerated treatment well    Behavior During Therapy WFL for tasks assessed/performed         Past Medical History:  Diagnosis Date   ADHD    Alcohol abuse    Anemia    Anxiety    Anxiety and depression    Arthritis    left knee   Asthma    hardly uses inhaler   B12 deficiency    Cancer (HCC)    Constipation    Depression    Diverticulosis    Fatty liver 2012   pt states she was told this once   Headache    Migraines   Heart murmur    pt was told this, no echo   History of stomach ulcers    Hypertension    history no longer requires medication management   IBS (irritable bowel syndrome)    IDA (iron  deficiency anemia)    Infertility, female    Joint pain    Lactose intolerance    Melasma    Palpitations    Perforated ulcer (HCC)    Pre-diabetes    PUD (peptic ulcer disease)    Sleep apnea    mild, no CPAP ordered   SOBOE (shortness of breath on exertion)    Vitamin D  deficiency    Past Surgical History:  Procedure Laterality Date   ABDOMINAL HYSTERECTOMY     ANKLE RECONSTRUCTION Left 09/09/2018   Procedure: BROSTRUM-GOULD LEFT;  Surgeon: Tracy [name], DPM;  Location: [facility];  Service: Podiatry;  Laterality: Left;   BREAST LUMPECTOMY WITH RADIOACTIVE SEED AND SENTINEL LYMPH NODE BIOPSY Left 02/25/2021   Procedure: LEFT BREAST LUMPECTOMY WITH RADIOACTIVE SEED AND SENTINEL LYMPH NODE BIOPSY;  Surgeon: Tracy Ned, MD;  Location: [facility];  Service: General;  Laterality: Left;   CHOLECYSTECTOMY     COLONOSCOPY     CYSTOSCOPY N/A  08/05/2016   Procedure: CYSTOSCOPY;  Surgeon: Tracy Browning, MD;  Location: [facility];  Service: Gynecology;  Laterality: N/A;   ENDOMETRIAL ABLATION W/ NOVASURE     GASTRIC BYPASS OPEN     LYSIS OF ADHESION N/A 08/05/2016   Procedure: LYSIS OF ADHESION;  Surgeon: Tracy Browning, MD;  Location: [facility];  Service: Gynecology;  Laterality: N/A;   perforated ulcer surgery     ROBOTIC ASSISTED SALPINGO OOPHERECTOMY Right 05/12/2017   Procedure: XI ROBOTIC ASSISTED RIGHT OOPHORECTOMY, Fulgeration of Endometriosis, Peritoneal Biopsy;  Surgeon: Tracy Browning, MD;  Location: [facility];  Service: Gynecology;  Laterality: Right;   ROBOTIC ASSISTED TOTAL HYSTERECTOMY WITH SALPINGECTOMY Bilateral 08/05/2016   Procedure: ROBOTIC ASSISTED TOTAL HYSTERECTOMY WITH SALPINGECTOMY;  Surgeon: Tracy Browning, MD;  Location: [facility];  Service: Gynecology;  Laterality: Bilateral;   TENDON REPAIR Left 09/09/2018   Procedure: TENOLYSIS, MULTIPLE;  Surgeon: Tracy [name], DPM;  Location: [facility];  Service: Podiatry;  Laterality: Left;   TONSILLECTOMY     UPPER GI ENDOSCOPY     Patient Active Problem List   Diagnosis Date Noted  BMI 38.0-38.9,adult 03/04/2023   BMI 45.0-49.9, adult (HCC) 08/19/2022   Obesity, Beginning BMI 50.59 08/19/2022   Closed nondisplaced fracture of sixth cervical vertebra with routine healing 04/29/2022   Polyphagia 04/01/2022   Hyperglycemia 02/23/2022   Elevated blood pressure reading 02/23/2022   Depression with anxiety 12/31/2021   Insulin  resistance 12/03/2021   Other constipation 12/03/2021   Other fatigue 11/19/2021   SOBOE (shortness of breath on exertion) 11/19/2021   Depression screening 11/19/2021   Hypertension 11/19/2021   Depression 11/19/2021   Vitamin D  deficiency 11/19/2021   Prediabetes 11/14/2021   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   Lactose intolerance 11/14/2021   Class 3 severe obesity with serious comorbidity and body mass index (BMI) of 50.0 to 59.9 in  adult 11/14/2021   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 (HCC) 02/12/2021   Lymphedema 10/04/2017   Vitamin B12 deficiency 06/11/2015   Anxiety and depression 01/29/2015   Airway hyperreactivity 01/29/2015   Gastroduodenal ulcer 01/29/2015   Other iron  deficiency anemia 01/29/2015   H/O infectious disease 12/01/2013   DD (diverticular disease) 10/02/2013   Cardiac murmur 09/12/2013   Chloasma 09/12/2013   PCP: Tracy Perkins, PA-C  REFERRING PROVIDER: Janith Drivers, DO   REFERRING DIAG: H81.90 (ICD-10-CM) - Vestibulopathy M54.2 (ICD-10-CM) - Neck pain  RATIONALE FOR EVALUATION AND TREATMENT: Rehabilitation  THERAPY DIAG: Dizziness and giddiness - Plan: PT plan of care cert/re-cert  ONSET DATE: January 2024  FOLLOW-UP APPT SCHEDULED WITH REFERRING PROVIDER: Yes, Dr. Maree upcoming;   SUBJECTIVE:   Chief Complaint:  Dizziness  Pertinent History Tracy [name] is a 58 y.o. year old female referred to PT for dizziness. In January 2024 she slipped, fell and struck her head on a commode resulting in C6-C7 nondisplaced fractures. She reports that it wasn't until July 2024 that she started experiencing headaches and now reports daily migraines. She also complains of constant dizziness and cognitive slowing. Her dizziness has been gradually improving. Pt experiences occasional vertigo and nausea, especially when looking up. She experiences frequent popping in her ears and saw an ENT as well as a dentist. Pt was fitted for night guard but it did not improve her symptoms. ENT did not order a VNG study as they were focused on her ear popping and jaw pain at the time. After her cervical spine injury pt reports that she has seen her PCP, neurosurgery, orthopedics, pain management and physical therapy in [place], [place], and [place]. She experiences ongoing chronic neck pain and had a spinal injection recently without any significant relief. Most recently she reports seeing a sports medicine  physician who felt that her dizziness symptoms were the result of post-concussion syndrome and referred her for vestibular therapy. She complains of phantosmia and reports occasionally smelling cookies and smoke. She has a history of chronic sleep disturbance as well as depression and anxiety for which she takes Pristiq, Latuda, and prn Xanax . She is also working with a therapist to process ongoing family stress. PMH includes anemia and breast CA s/p lumpectomy and radiation  Cervical MRI 08/30/23 FINDINGS:  Bone marrow signal intensity is normal. No marrow edema or displacement associated with remote right C6 and C7 articular pillar fractures. Normal signal in the spinal cord.   2 mm anterolisthesis of C5 on C6 and 3 mm anterolisthesis of C6 on C7. Multilevel intervertebral disc desiccation and height loss.   C2-C3: Bilateral facet arthropathy. No significant spinal canal or neural foraminal narrowing.   C3-C4: Posterior disc bulge. Bilateral uncovertebral spurring and  facet arthropathy. Minimal spinal canal narrowing. Mild left neural foraminal narrowing.   C4-C5: No spinal canal narrowing. Bilateral facet arthrosis. No neural foraminal narrowing.   C5-C6: Small posterior disc bulge with anterolisthesis of C5 on C6. Slight uncovertebral spurring. No significant spinal canal or neural foraminal narrowing.   C6-C7: Anterolisthesis of C6 on C7 with posterior disc bulge. Bilateral facet arthropathy. Mild spinal canal narrowing. Moderate right neural foraminal narrowing.   C7-T1: No spinal canal narrowing. No neural foraminal narrowing.   There is a T2 hyperintense nodule in the right neck at the level of the hypopharynx, posterior to the right thyroid  lobe, just lateral to the esophagus, and anterior to the right longus colli muscle, which is indeterminate, but which appears stable in retrospect from prior CT of the cervical spine from 04/06/2022.   The paraspinal tissues are within normal limits.    Brain MRI 01/11/23: IMPRESSION:  1. No acute intracranial abnormality.  2.  Grossly normal appearance of the bilateral olfactory bulbs.  3.  Scattered T2 FLAIR hyperintense foci within the periventricular and subcortical white matter, which are nonspecific but typically attributed to the sequela of prior trauma/inflammation/demyelination or chronic ischemia associated with migraine/atherosclerosis.   Description of dizziness: vertigo, unsteadiness Frequency: Daily Duration: Constant Symptom nature: constant Progression of symptoms since onset: better History of similar episodes: No  Provocative Factors: closing eyes, looking up, focusing and concentrating Easing Factors: Unsure  Auditory complaints (tinnitus, pain, drainage, hearing loss, aural fullness): Yes, regular R ear popping, tinnitus bilaterally (worse R), pt has noticed that she has had to turn the television up louder recently and is wondering if she has hearing loss,   Vision changes (diplopia, visual field loss, recent changes, recent eye exam): Yes, trouble focusing, blurriness, and occasional double vision? No visual field cut, eye exam 6 months ago with mild prescription change; Chest pain/palpitations: No History of head injury/concussion: Yes, 03/2022 (none prior) Stress/anxiety: Yes, stress related to health concerns, stress related to caring for family;  Migraines/headaches: Yes, daily migraines Nausea/vomiting: Yes, nausea Numbness/tingling: Yes, RUE Focal weakness: Yes, RUE weakness (NCV ordered); Dysarthria/dysphagia/drop attacks: Yes, pt reports some difficulty with pronouncing words  Has patient fallen in last 6 months? Yes, Number of falls: 1 (no injury) Pertinent pain: Yes, neck pain; Dominant hand: right Imaging: Yes, see history; Prior level of function: Independent Occupational demands: Not currently working; Hobbies: play with niece and granddaughter, assisting her husbands business; Red Flags:  History of breast cancer 2022, hot flashes, Negative for bowel/bladder changes, chills/fever,unexplained weight loss/gain  PRECAUTIONS: None  WEIGHT BEARING RESTRICTIONS No  LIVING ENVIRONMENT:Lives with: lives with their spouse  PATIENT GOALSDecrease dizziness   OBJECTIVE EXAMINATION  POSTURE: No gross deficits contributing to symptoms  NEUROLOGICAL SCREEN: (2+ unless otherwise noted.) N=normal  Ab=abnormal Deferred  CRANIAL NERVES Deferred  COORDINATION Deferred   RANGE OF MOTION Deferred  MANUAL MUSCLE TESTING BUE/BLE strength WNL without focal deficits  TRANSFERS/GAIT Independent for transfers and ambulation without assistive device   PATIENT SURVEYS ABC: 8.1% DHI: 72/100  OCULOMOTOR / VESTIBULAR TESTING  Oculomotor Exam- Room Light Deferred  Oculomotor Exam- Fixation Suppressed Deferred  BPPV TESTS:  Symptoms Duration Intensity Nystagmus  L Dix-Hallpike None   None  R Dix-Hallpike None   None  L Head Roll None   None  R Head Roll None   None  L Sidelying Test      R Sidelying Test      (blank = not tested)  Clinical Test of Sensory Interaction  for Balance (CTSIB): Deferred  FUNCTIONAL OUTCOME MEASURES  Results Comments  BERG    DGI    FGA    TUG    5TSTS    6 Minute Walk Test    10 Meter Gait Speed    (blank = not tested)  TODAY'S TREATMENT  Deferred  PATIENT EDUCATION:  Education details: Plan of care Person educated: Patient Education method: Explanation Education comprehension: verbalized understanding  HOME EXERCISE PROGRAM:  None currently  ASSESSMENT: CLINICAL IMPRESSION: Patient is a 58 y.o. female who was seen today for physical therapy evaluation and treatment for dizziness. Due to a complex history her examination was extremely limited however BPPV testing was negative. Plan to perform much more extensive testing during her first follow-up visit. She has an extremely low ABC Scale score and her DHI of 72/100 indicates  severe disability related to her dizziness. Pt sent home with [place] Post Concussion Symptom Questionnaire to bring back at her first follow-up visit. Pt will benefit from PT services to address deficits in dizziness and imbalance in order to decrease symptoms and improve function at home.    OBJECTIVE IMPAIRMENTS: decreased balance and dizziness.   ACTIVITY LIMITATIONS: bending and caring for others  PARTICIPATION LIMITATIONS: meal prep, cleaning, laundry, shopping, and community activity  PERSONAL FACTORS: Behavior pattern, Past/current experiences, Time since onset of injury/illness/exacerbation, and 3+ comorbidities: anxiety, depression, breast CA, cervical spine fractures, and migraines are also affecting patient's functional outcome.   REHAB POTENTIAL: Fair    CLINICAL DECISION MAKING: Unstable/unpredictable  EVALUATION COMPLEXITY: High   GOALS:  SHORT TERM GOALS: Target date: 11/16/2023  Pt will be independent with HEP for dizziness in order to decrease symptoms, improve balance,decrease fall risk, and improve function at home. Baseline: Goal status: INITIAL   LONG TERM GOALS: Target date: 12/14/2023  Pt will report at least 75% improvement in dizziness in order to improve symptom free function with household responsibility and when spending time with her family.  Baseline:  Goal status: INITIAL  2.  Pt will decrease DHI score by at least 18 points in order to demonstrate clinically significant reduction in disability related to dizziness.  Baseline: 72/100 Goal status: INITIAL  3.  Pt will improve ABC by at least 13% in order to demonstrate clinically significant improvement in balance confidence.      Baseline: 8.1% Goal status: INITIAL  4. Pt will improve FGA by at least 4 points in order to demonstrate clinically significant improvement in balance and decreased risk for falls.     Baseline: To be performed Goal status: INITIAL   PLAN: PT FREQUENCY:  1x/week  PT DURATION: 8 weeks  PLANNED INTERVENTIONS: Therapeutic exercises, Therapeutic activity, Neuromuscular re-education, Balance training, Gait training, Patient/Family education, Self Care, Vestibular training, Canalith repositioning, Dry Needling, Electrical stimulation, Spinal manipulation, Cryotherapy, Moist heat, Manual therapy, and Re-evaluation.  PLAN FOR NEXT SESSION: oculomotor/vestibular testing with/without fixation suppression, cranial nerve and coordination testing, balance testing (BERG, FGA), initiate vestibular therapy and balance interventions as appropriate.   Beauty Pless D Lanis Storlie PT, DPT, GCS  Joylynn Defrancesco, PT 10/20/2023, 9:27 PM

## 2023-10-19 ENCOUNTER — Ambulatory Visit: Attending: Physical Medicine & Rehabilitation

## 2023-10-19 DIAGNOSIS — R42 Dizziness and giddiness: Secondary | ICD-10-CM | POA: Insufficient documentation

## 2023-10-26 ENCOUNTER — Ambulatory Visit

## 2023-10-28 NOTE — Therapy (Incomplete)
 OUTPATIENT PHYSICAL THERAPY VESTIBULAR TREATMENT  Patient Name: Tracy [name] MRN: [id number] DOB:07-21-1965, 58 y.o., female Today's Date: 10/28/2023  END OF SESSION:   Past Medical History:  Diagnosis Date   ADHD    Alcohol abuse    Anemia    Anxiety    Anxiety and depression    Arthritis    left knee   Asthma    hardly uses inhaler   B12 deficiency    Cancer (HCC)    Constipation    Depression    Diverticulosis    Fatty liver 2012   pt states she was told this once   Headache    Migraines   Heart murmur    pt was told this, no echo   History of stomach ulcers    Hypertension    history no longer requires medication management   IBS (irritable bowel syndrome)    IDA (iron  deficiency anemia)    Infertility, female    Joint pain    Lactose intolerance    Melasma    Palpitations    Perforated ulcer (HCC)    Pre-diabetes    PUD (peptic ulcer disease)    Sleep apnea    mild, no CPAP ordered   SOBOE (shortness of breath on exertion)    Vitamin D  deficiency    Past Surgical History:  Procedure Laterality Date   ABDOMINAL HYSTERECTOMY     ANKLE RECONSTRUCTION Left 09/09/2018   Procedure: BROSTRUM-GOULD LEFT;  Surgeon: Ashley Soulier, DPM;  Location: [facility];  Service: Podiatry;  Laterality: Left;   BREAST LUMPECTOMY WITH RADIOACTIVE SEED AND SENTINEL LYMPH NODE BIOPSY Left 02/25/2021   Procedure: LEFT BREAST LUMPECTOMY WITH RADIOACTIVE SEED AND SENTINEL LYMPH NODE BIOPSY;  Surgeon: Vanderbilt Ned, MD;  Location: [facility];  Service: General;  Laterality: Left;   CHOLECYSTECTOMY     COLONOSCOPY     CYSTOSCOPY N/A 08/05/2016   Procedure: CYSTOSCOPY;  Surgeon: Sarrah Browning, MD;  Location: [facility];  Service: Gynecology;  Laterality: N/A;   ENDOMETRIAL ABLATION W/ NOVASURE     GASTRIC BYPASS OPEN     LYSIS OF ADHESION N/A 08/05/2016   Procedure: LYSIS OF ADHESION;  Surgeon: Sarrah Browning, MD;  Location: [facility];  Service: Gynecology;  Laterality: N/A;    perforated ulcer surgery     ROBOTIC ASSISTED SALPINGO OOPHERECTOMY Right 05/12/2017   Procedure: XI ROBOTIC ASSISTED RIGHT OOPHORECTOMY, Fulgeration of Endometriosis, Peritoneal Biopsy;  Surgeon: Sarrah Browning, MD;  Location: [facility];  Service: Gynecology;  Laterality: Right;   ROBOTIC ASSISTED TOTAL HYSTERECTOMY WITH SALPINGECTOMY Bilateral 08/05/2016   Procedure: ROBOTIC ASSISTED TOTAL HYSTERECTOMY WITH SALPINGECTOMY;  Surgeon: Sarrah Browning, MD;  Location: [facility];  Service: Gynecology;  Laterality: Bilateral;   TENDON REPAIR Left 09/09/2018   Procedure: TENOLYSIS, MULTIPLE;  Surgeon: Ashley Soulier, DPM;  Location: [facility];  Service: Podiatry;  Laterality: Left;   TONSILLECTOMY     UPPER GI ENDOSCOPY     Patient Active Problem List   Diagnosis Date Noted   BMI 38.0-38.9,adult 03/04/2023   BMI 45.0-49.9, adult (HCC) 08/19/2022   Obesity, Beginning BMI 50.59 08/19/2022   Closed nondisplaced fracture of sixth cervical vertebra with routine healing 04/29/2022   Polyphagia 04/01/2022   Hyperglycemia 02/23/2022   Elevated blood pressure reading 02/23/2022   Depression with anxiety 12/31/2021   Insulin  resistance 12/03/2021   Other constipation 12/03/2021   Other fatigue 11/19/2021   SOBOE (shortness of breath on exertion) 11/19/2021   Depression screening 11/19/2021   Hypertension  11/19/2021   Depression 11/19/2021   Vitamin D  deficiency 11/19/2021   Prediabetes 11/14/2021   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   Lactose intolerance 11/14/2021   Class 3 severe obesity with serious comorbidity and body mass index (BMI) of 50.0 to 59.9 in adult 11/14/2021   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 (HCC) 02/12/2021   Lymphedema 10/04/2017   Vitamin B12 deficiency 06/11/2015   Anxiety and depression 01/29/2015   Airway hyperreactivity 01/29/2015   Gastroduodenal ulcer 01/29/2015   Other iron  deficiency anemia 01/29/2015   H/O infectious disease 12/01/2013   DD (diverticular  disease) 10/02/2013   Cardiac murmur 09/12/2013   Chloasma 09/12/2013   PCP: Johnie Perkins, PA-C  REFERRING PROVIDER: Janith Drivers, DO   REFERRING DIAG: H81.90 (ICD-10-CM) - Vestibulopathy M54.2 (ICD-10-CM) - Neck pain  RATIONALE FOR EVALUATION AND TREATMENT: Rehabilitation  THERAPY DIAG: Dizziness and giddiness  ONSET DATE: January 2024  FOLLOW-UP APPT SCHEDULED WITH REFERRING PROVIDER: Yes, Dr. Maree upcoming;  FROM INITIAL EVALUATION SUBJECTIVE:   Chief Complaint:  Dizziness  Pertinent History Tracy [name] is a 58 y.o. year old female referred to PT for dizziness. In January 2024 she slipped, fell and struck her head on a commode resulting in C6-C7 nondisplaced fractures. She reports that it wasn't until July 2024 that she started experiencing headaches and now reports daily migraines. She also complains of constant dizziness and cognitive slowing. Her dizziness has been gradually improving. Pt experiences occasional vertigo and nausea, especially when looking up. She experiences frequent popping in her ears and saw an ENT as well as a dentist. Pt was fitted for night guard but it did not improve her symptoms. ENT did not order a VNG study as they were focused on her ear popping and jaw pain at the time. After her cervical spine injury pt reports that she has seen her PCP, neurosurgery, orthopedics, pain management and physical therapy in [place], [place], and [place]. She experiences ongoing chronic neck pain and had a spinal injection recently without any significant relief. Most recently she reports seeing a sports medicine physician who felt that her dizziness symptoms were the result of post-concussion syndrome and referred her for vestibular therapy. She complains of phantosmia and reports occasionally smelling cookies and smoke. She has a history of chronic sleep disturbance as well as depression and anxiety for which she takes Pristiq, Latuda, and prn Xanax . She is also working  with a therapist to process ongoing family stress. PMH includes anemia and breast CA s/p lumpectomy and radiation  Cervical MRI 08/30/23 FINDINGS:  Bone marrow signal intensity is normal. No marrow edema or displacement associated with remote right C6 and C7 articular pillar fractures. Normal signal in the spinal cord.   2 mm anterolisthesis of C5 on C6 and 3 mm anterolisthesis of C6 on C7. Multilevel intervertebral disc desiccation and height loss.   C2-C3: Bilateral facet arthropathy. No significant spinal canal or neural foraminal narrowing.   C3-C4: Posterior disc bulge. Bilateral uncovertebral spurring and facet arthropathy. Minimal spinal canal narrowing. Mild left neural foraminal narrowing.   C4-C5: No spinal canal narrowing. Bilateral facet arthrosis. No neural foraminal narrowing.   C5-C6: Small posterior disc bulge with anterolisthesis of C5 on C6. Slight uncovertebral spurring. No significant spinal canal or neural foraminal narrowing.   C6-C7: Anterolisthesis of C6 on C7 with posterior disc bulge. Bilateral facet arthropathy. Mild spinal canal narrowing. Moderate right neural foraminal narrowing.   C7-T1: No spinal canal narrowing. No neural foraminal narrowing.   There is  a T2 hyperintense nodule in the right neck at the level of the hypopharynx, posterior to the right thyroid  lobe, just lateral to the esophagus, and anterior to the right longus colli muscle, which is indeterminate, but which appears stable in retrospect from prior CT of the cervical spine from 04/06/2022.   The paraspinal tissues are within normal limits.   Brain MRI 01/11/23: IMPRESSION:  1. No acute intracranial abnormality.  2.  Grossly normal appearance of the bilateral olfactory bulbs.  3.  Scattered T2 FLAIR hyperintense foci within the periventricular and subcortical white matter, which are nonspecific but typically attributed to the sequela of prior trauma/inflammation/demyelination or chronic  ischemia associated with migraine/atherosclerosis.   Description of dizziness: vertigo, unsteadiness Frequency: Daily Duration: Constant Symptom nature: constant Progression of symptoms since onset: better History of similar episodes: No  Provocative Factors: closing eyes, looking up, focusing and concentrating Easing Factors: Unsure  Auditory complaints (tinnitus, pain, drainage, hearing loss, aural fullness): Yes, regular R ear popping, tinnitus bilaterally (worse R), pt has noticed that she has had to turn the television up louder recently and is wondering if she has hearing loss,   Vision changes (diplopia, visual field loss, recent changes, recent eye exam): Yes, trouble focusing, blurriness, and occasional double vision? No visual field cut, eye exam 6 months ago with mild prescription change; Chest pain/palpitations: No History of head injury/concussion: Yes, 03/2022 (none prior) Stress/anxiety: Yes, stress related to health concerns, stress related to caring for family;  Migraines/headaches: Yes, daily migraines Nausea/vomiting: Yes, nausea Numbness/tingling: Yes, RUE Focal weakness: Yes, RUE weakness (NCV ordered); Dysarthria/dysphagia/drop attacks: Yes, pt reports some difficulty with pronouncing words  Has patient fallen in last 6 months? Yes, Number of falls: 1 (no injury) Pertinent pain: Yes, neck pain; Dominant hand: right Imaging: Yes, see history; Prior level of function: Independent Occupational demands: Not currently working; Hobbies: play with niece and granddaughter, assisting her husbands business; Red Flags: History of breast cancer 2022, hot flashes, Negative for bowel/bladder changes, chills/fever,unexplained weight loss/gain  PRECAUTIONS: None  WEIGHT BEARING RESTRICTIONS No  LIVING ENVIRONMENT:Lives with: lives with their spouse  PATIENT GOALSDecrease dizziness   OBJECTIVE EXAMINATION  POSTURE: No gross deficits contributing to  symptoms  NEUROLOGICAL SCREEN: (2+ unless otherwise noted.) N=normal  Ab=abnormal Deferred  CRANIAL NERVES Deferred  COORDINATION Deferred   RANGE OF MOTION Deferred  MANUAL MUSCLE TESTING BUE/BLE strength WNL without focal deficits  TRANSFERS/GAIT Independent for transfers and ambulation without assistive device   PATIENT SURVEYS ABC: 8.1% DHI: 72/100  OCULOMOTOR / VESTIBULAR TESTING  Oculomotor Exam- Room Light Deferred  Oculomotor Exam- Fixation Suppressed Deferred  BPPV TESTS:  Symptoms Duration Intensity Nystagmus  L Dix-Hallpike None   None  R Dix-Hallpike None   None  L Head Roll None   None  R Head Roll None   None  L Sidelying Test      R Sidelying Test      (blank = not tested)  Clinical Test of Sensory Interaction for Balance (CTSIB): Deferred  FUNCTIONAL OUTCOME MEASURES  Results Comments  BERG    DGI    FGA    TUG    5TSTS    6 Minute Walk Test    10 Meter Gait Speed    (blank = not tested)  TODAY'S TREATMENT   SUBJECTIVE: Pt reports that he is doing well today. No changes since the initial evaluation/last therapy session. Denies pain. No specific questions or concerns.   PAIN:  Ther-ex   Neuromuscular Re-education  oculomotor/vestibular testing with/without fixation suppression, cranial nerve and coordination testing, balance testing (BERG, FGA), initiate vestibular therapy and balance interventions as appropriate.  CRANIAL NERVES II, III, IV, VI: Pupils equal and reactive to light, visual acuity and visual fields are intact, extraocular muscles are intact  V: Facial sensation is intact and symmetric bilaterally  VII: Facial strength is intact and symmetric bilaterally  VIII: Hearing is normal as tested by gross conversation IX, X: Palate elevates midline, normal phonation, uvula midline XI: Shoulder shrug strength is intact  XII: Tongue protrudes midline   COORDINATION Finger to Nose: Normal Heel to Shin: Normal Pronator  Drift: Negative Rapid Alternating Movements: Normal Finger to Thumb Opposition: Normal  OCULOMOTOR / VESTIBULAR TESTING  Oculomotor Exam- Room Light  Findings Comments  Ocular Alignment {normal/abnormal/not examined:14677}   Ocular ROM {normal/abnormal/not examined:14677}   Spontaneous Nystagmus {normal/abnormal/not examined:14677}   Gaze-Holding Nystagmus {normal/abnormal/not examined:14677}   End-Gaze Nystagmus {normal/abnormal/not examined:14677}   Vergence (normal 2-3) {normal/abnormal/not examined:14677}   Smooth Pursuit {normal/abnormal/not examined:14677}   Cross-Cover Test {normal/abnormal/not examined:14677}   Saccades {normal/abnormal/not examined:14677}   VOR Cancellation {normal/abnormal/not examined:14677}   Left Head Impulse {normal/abnormal/not examined:14677}   Right Head Impulse {normal/abnormal/not examined:14677}   Static Acuity {normal/abnormal/not examined:14677}   Dynamic Acuity {normal/abnormal/not examined:14677}    Oculomotor Exam- Fixation Suppressed  Findings Comments  Ocular Alignment {normal/abnormal/not examined:14677}   Spontaneous Nystagmus {normal/abnormal/not examined:14677}   Gaze-Holding Nystagmus {normal/abnormal/not examined:14677}   End-Gaze Nystagmus {normal/abnormal/not examined:14677}   Head Shaking Nystagmus {normal/abnormal/not examined:14677}   Pressure-Induced Nystagmus {normal/abnormal/not examined:14677}   Hyperventilation Induced Nystagmus {normal/abnormal/not examined:14677}   Skull Vibration Induced Nystagmus {normal/abnormal/not examined:14677}    FUNCTIONAL OUTCOME MEASURES  Results Comments  BERG    DGI    FGA    TUG    5TSTS    6 Minute Walk Test    10 Meter Gait Speed    (blank = not tested)  Manual Therapy    PATIENT EDUCATION:  Education details: Plan of care Person educated: Patient Education method: Explanation Education comprehension: verbalized understanding  HOME EXERCISE PROGRAM:  None  currently  ASSESSMENT: CLINICAL IMPRESSION: Initiated balance and strength exercises during session today with patient. Issued HEP and reviewed with patient. Pt encouraged to follow-up as scheduled. Pt will benefit from PT services to address deficits in strength, balance, and mobility in order to return to full function at home and decrease his risk for falls.    Patient is a 58 y.o. female who was seen today for physical therapy evaluation and treatment for dizziness. Due to a complex history her examination was extremely limited however BPPV testing was negative. Plan to perform much more extensive testing during her first follow-up visit. She has an extremely low ABC Scale score and her DHI of 72/100 indicates severe disability related to her dizziness. Pt sent home with [place] Post Concussion Symptom Questionnaire to bring back at her first follow-up visit. Pt will benefit from PT services to address deficits in dizziness and imbalance in order to decrease symptoms and improve function at home.    OBJECTIVE IMPAIRMENTS: decreased balance and dizziness.   ACTIVITY LIMITATIONS: bending and caring for others  PARTICIPATION LIMITATIONS: meal prep, cleaning, laundry, shopping, and community activity  PERSONAL FACTORS: Behavior pattern, Past/current experiences, Time since onset of injury/illness/exacerbation, and 3+ comorbidities: anxiety, depression, breast CA, cervical spine fractures, and migraines are also affecting patient's functional outcome.   REHAB POTENTIAL: Fair    CLINICAL DECISION MAKING: Unstable/unpredictable  EVALUATION COMPLEXITY: High   GOALS:  SHORT  TERM GOALS: Target date: 11/16/2023  Pt will be independent with HEP for dizziness in order to decrease symptoms, improve balance,decrease fall risk, and improve function at home. Baseline: Goal status: INITIAL   LONG TERM GOALS: Target date: 12/14/2023  Pt will report at least 75% improvement in dizziness in order to  improve symptom free function with household responsibility and when spending time with her family.  Baseline:  Goal status: INITIAL  2.  Pt will decrease DHI score by at least 18 points in order to demonstrate clinically significant reduction in disability related to dizziness.  Baseline: 72/100 Goal status: INITIAL  3.  Pt will improve ABC by at least 13% in order to demonstrate clinically significant improvement in balance confidence.      Baseline: 8.1% Goal status: INITIAL  4. Pt will improve FGA by at least 4 points in order to demonstrate clinically significant improvement in balance and decreased risk for falls.     Baseline: To be performed Goal status: INITIAL   PLAN: PT FREQUENCY: 1x/week  PT DURATION: 8 weeks  PLANNED INTERVENTIONS: Therapeutic exercises, Therapeutic activity, Neuromuscular re-education, Balance training, Gait training, Patient/Family education, Self Care, Vestibular training, Canalith repositioning, Dry Needling, Electrical stimulation, Spinal manipulation, Cryotherapy, Moist heat, Manual therapy, and Re-evaluation.  PLAN FOR NEXT SESSION: oculomotor/vestibular testing with/without fixation suppression, cranial nerve and coordination testing, balance testing (BERG, FGA), initiate vestibular therapy and balance interventions as appropriate.   Mariafernanda Hendricksen D Lukah Goswami PT, DPT, [facility]  Sadeen Wiegel, PT 10/28/2023, 1:25 PM

## 2023-11-02 ENCOUNTER — Ambulatory Visit

## 2023-11-02 ENCOUNTER — Other Ambulatory Visit: Payer: Self-pay

## 2023-11-02 DIAGNOSIS — E2839 Other primary ovarian failure: Secondary | ICD-10-CM

## 2023-11-02 DIAGNOSIS — D508 Other iron deficiency anemias: Secondary | ICD-10-CM

## 2023-11-02 DIAGNOSIS — Z170 Estrogen receptor positive status [ER+]: Secondary | ICD-10-CM

## 2023-11-02 DIAGNOSIS — E538 Deficiency of other specified B group vitamins: Secondary | ICD-10-CM

## 2023-11-03 ENCOUNTER — Inpatient Hospital Stay: Payer: BC Managed Care – PPO | Admitting: Hematology

## 2023-11-03 ENCOUNTER — Inpatient Hospital Stay: Payer: BC Managed Care – PPO

## 2023-11-03 NOTE — Assessment & Plan Note (Deleted)
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  Oncotype RS 4 -Found on screening mammogram. S/p left lumpectomy 02/25/21 with Dr. Luisa Hart, pathology showed 0.9 cm IDC, benign margins and lymph nodes (0/3). -Oncotype RS of 4, indicating low risk.  -s/p adjuvant radiation under Dr. Basilio Cairo, 04/03/21 - 05/05/21 -she is s/p hysterectomy 07/2016, ovaries are still in place. Labs from 05/02/21 show she is still pre-menopausal, so she began tamoxifen in 05/2021. She is tolerating well with hot flashes -she is clinically doing well. Labs reviewed, she has stable anemia. Physical exam was unremarkable. There is no clinical concern for recurrence.

## 2023-11-05 ENCOUNTER — Ambulatory Visit: Payer: Self-pay | Admitting: Surgery

## 2023-11-09 ENCOUNTER — Encounter

## 2023-11-12 ENCOUNTER — Telehealth: Payer: Self-pay

## 2023-11-12 NOTE — Telephone Encounter (Signed)
 Copied from CRM [id number]. Topic: Clinical - Prescription Issue >> Nov 12, 2023  9:34 AM Devaughn RAMAN wrote: Reason for CRM: Patient called in regarding medication zolpidem  (AMBIEN  CR) 12.5 MG CR tablet. Patient stated her insurance won't pay for the medication because its extended release and [facility] contacted Dr.Olalere to explain why patient needs the extended release on insurance behalf but they are  awaiting a response from Dr.Olalere. Patient stated [facility] advised for Dr.Olalere to resubmit for the medication to her insurance company to explain why she needs the extended release. Patient stated she has 2 refills but she only has 1 remaining for 3 months.

## 2023-11-15 NOTE — Therapy (Incomplete)
 OUTPATIENT PHYSICAL THERAPY VESTIBULAR TREATMENT  Patient Name: Tracy [name] MRN: [id number] DOB:1965-09-06, 58 y.o., female Today's Date: 11/15/2023  END OF SESSION:   Past Medical History:  Diagnosis Date   ADHD    Alcohol abuse    Anemia    Anxiety    Anxiety and depression    Arthritis    left knee   Asthma    hardly uses inhaler   B12 deficiency    Cancer (HCC)    Constipation    Depression    Diverticulosis    Fatty liver 2012   pt states she was told this once   Headache    Migraines   Heart murmur    pt was told this, no echo   History of stomach ulcers    Hypertension    history no longer requires medication management   IBS (irritable bowel syndrome)    IDA (iron  deficiency anemia)    Infertility, female    Joint pain    Lactose intolerance    Melasma    Palpitations    Perforated ulcer (HCC)    Pre-diabetes    PUD (peptic ulcer disease)    Sleep apnea    mild, no CPAP ordered   SOBOE (shortness of breath on exertion)    Vitamin D  deficiency    Past Surgical History:  Procedure Laterality Date   ABDOMINAL HYSTERECTOMY     ANKLE RECONSTRUCTION Left 09/09/2018   Procedure: BROSTRUM-GOULD LEFT;  Surgeon: Ashley Soulier, DPM;  Location: [facility];  Service: Podiatry;  Laterality: Left;   BREAST LUMPECTOMY WITH RADIOACTIVE SEED AND SENTINEL LYMPH NODE BIOPSY Left 02/25/2021   Procedure: LEFT BREAST LUMPECTOMY WITH RADIOACTIVE SEED AND SENTINEL LYMPH NODE BIOPSY;  Surgeon: Vanderbilt Ned, MD;  Location: [facility];  Service: General;  Laterality: Left;   CHOLECYSTECTOMY     COLONOSCOPY     CYSTOSCOPY N/A 08/05/2016   Procedure: CYSTOSCOPY;  Surgeon: Sarrah Browning, MD;  Location: [facility];  Service: Gynecology;  Laterality: N/A;   ENDOMETRIAL ABLATION W/ NOVASURE     GASTRIC BYPASS OPEN     LYSIS OF ADHESION N/A 08/05/2016   Procedure: LYSIS OF ADHESION;  Surgeon: Sarrah Browning, MD;  Location: [facility];  Service: Gynecology;  Laterality: N/A;    perforated ulcer surgery     ROBOTIC ASSISTED SALPINGO OOPHERECTOMY Right 05/12/2017   Procedure: XI ROBOTIC ASSISTED RIGHT OOPHORECTOMY, Fulgeration of Endometriosis, Peritoneal Biopsy;  Surgeon: Sarrah Browning, MD;  Location: [facility];  Service: Gynecology;  Laterality: Right;   ROBOTIC ASSISTED TOTAL HYSTERECTOMY WITH SALPINGECTOMY Bilateral 08/05/2016   Procedure: ROBOTIC ASSISTED TOTAL HYSTERECTOMY WITH SALPINGECTOMY;  Surgeon: Sarrah Browning, MD;  Location: [facility];  Service: Gynecology;  Laterality: Bilateral;   TENDON REPAIR Left 09/09/2018   Procedure: TENOLYSIS, MULTIPLE;  Surgeon: Ashley Soulier, DPM;  Location: [facility];  Service: Podiatry;  Laterality: Left;   TONSILLECTOMY     UPPER GI ENDOSCOPY     Patient Active Problem List   Diagnosis Date Noted   BMI 38.0-38.9,adult 03/04/2023   BMI 45.0-49.9, adult (HCC) 08/19/2022   Obesity, Beginning BMI 50.59 08/19/2022   Closed nondisplaced fracture of sixth cervical vertebra with routine healing 04/29/2022   Polyphagia 04/01/2022   Hyperglycemia 02/23/2022   Elevated blood pressure reading 02/23/2022   Depression with anxiety 12/31/2021   Insulin  resistance 12/03/2021   Other constipation 12/03/2021   Other fatigue 11/19/2021   SOBOE (shortness of breath on exertion) 11/19/2021   Depression screening 11/19/2021   Hypertension  11/19/2021   Depression 11/19/2021   Vitamin D  deficiency 11/19/2021   Prediabetes 11/14/2021   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   Lactose intolerance 11/14/2021   Class 3 severe obesity with serious comorbidity and body mass index (BMI) of 50.0 to 59.9 in adult 11/14/2021   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 (HCC) 02/12/2021   Lymphedema 10/04/2017   Vitamin B12 deficiency 06/11/2015   Anxiety and depression 01/29/2015   Airway hyperreactivity 01/29/2015   Gastroduodenal ulcer 01/29/2015   Other iron  deficiency anemia 01/29/2015   H/O infectious disease 12/01/2013   DD (diverticular  disease) 10/02/2013   Cardiac murmur 09/12/2013   Chloasma 09/12/2013   PCP: Johnie Perkins, PA-C  REFERRING PROVIDER: Janith Drivers, DO   REFERRING DIAG: H81.90 (ICD-10-CM) - Vestibulopathy M54.2 (ICD-10-CM) - Neck pain  RATIONALE FOR EVALUATION AND TREATMENT: Rehabilitation  THERAPY DIAG: Dizziness and giddiness  ONSET DATE: January 2024  FOLLOW-UP APPT SCHEDULED WITH REFERRING PROVIDER: Yes, Dr. Maree upcoming;  FROM INITIAL EVALUATION SUBJECTIVE:   Chief Complaint:  Dizziness  Pertinent History Ms. Steinmeyer is a 58 y.o. year old female referred to PT for dizziness. In January 2024 she slipped, fell and struck her head on a commode resulting in C6-C7 nondisplaced fractures. She reports that it wasn't until July 2024 that she started experiencing headaches and now reports daily migraines. She also complains of constant dizziness and cognitive slowing. Her dizziness has been gradually improving. Pt experiences occasional vertigo and nausea, especially when looking up. She experiences frequent popping in her ears and saw an ENT as well as a dentist. Pt was fitted for night guard but it did not improve her symptoms. ENT did not order a VNG study as they were focused on her ear popping and jaw pain at the time. After her cervical spine injury pt reports that she has seen her PCP, neurosurgery, orthopedics, pain management and physical therapy in [place], [place], and [place]. She experiences ongoing chronic neck pain and had a spinal injection recently without any significant relief. Most recently she reports seeing a sports medicine physician who felt that her dizziness symptoms were the result of post-concussion syndrome and referred her for vestibular therapy. She complains of phantosmia and reports occasionally smelling cookies and smoke. She has a history of chronic sleep disturbance as well as depression and anxiety for which she takes Pristiq, Latuda, and prn Xanax . She is also working  with a therapist to process ongoing family stress. PMH includes anemia and breast CA s/p lumpectomy and radiation  Cervical MRI 08/30/23 FINDINGS:  Bone marrow signal intensity is normal. No marrow edema or displacement associated with remote right C6 and C7 articular pillar fractures. Normal signal in the spinal cord.   2 mm anterolisthesis of C5 on C6 and 3 mm anterolisthesis of C6 on C7. Multilevel intervertebral disc desiccation and height loss.   C2-C3: Bilateral facet arthropathy. No significant spinal canal or neural foraminal narrowing.   C3-C4: Posterior disc bulge. Bilateral uncovertebral spurring and facet arthropathy. Minimal spinal canal narrowing. Mild left neural foraminal narrowing.   C4-C5: No spinal canal narrowing. Bilateral facet arthrosis. No neural foraminal narrowing.   C5-C6: Small posterior disc bulge with anterolisthesis of C5 on C6. Slight uncovertebral spurring. No significant spinal canal or neural foraminal narrowing.   C6-C7: Anterolisthesis of C6 on C7 with posterior disc bulge. Bilateral facet arthropathy. Mild spinal canal narrowing. Moderate right neural foraminal narrowing.   C7-T1: No spinal canal narrowing. No neural foraminal narrowing.   There is  a T2 hyperintense nodule in the right neck at the level of the hypopharynx, posterior to the right thyroid  lobe, just lateral to the esophagus, and anterior to the right longus colli muscle, which is indeterminate, but which appears stable in retrospect from prior CT of the cervical spine from 04/06/2022.   The paraspinal tissues are within normal limits.   Brain MRI 01/11/23: IMPRESSION:  1. No acute intracranial abnormality.  2.  Grossly normal appearance of the bilateral olfactory bulbs.  3.  Scattered T2 FLAIR hyperintense foci within the periventricular and subcortical white matter, which are nonspecific but typically attributed to the sequela of prior trauma/inflammation/demyelination or chronic  ischemia associated with migraine/atherosclerosis.   Description of dizziness: vertigo, unsteadiness Frequency: Daily Duration: Constant Symptom nature: constant Progression of symptoms since onset: better History of similar episodes: No  Provocative Factors: closing eyes, looking up, focusing and concentrating Easing Factors: Unsure  Auditory complaints (tinnitus, pain, drainage, hearing loss, aural fullness): Yes, regular R ear popping, tinnitus bilaterally (worse R), pt has noticed that she has had to turn the television up louder recently and is wondering if she has hearing loss,   Vision changes (diplopia, visual field loss, recent changes, recent eye exam): Yes, trouble focusing, blurriness, and occasional double vision? No visual field cut, eye exam 6 months ago with mild prescription change; Chest pain/palpitations: No History of head injury/concussion: Yes, 03/2022 (none prior) Stress/anxiety: Yes, stress related to health concerns, stress related to caring for family;  Migraines/headaches: Yes, daily migraines Nausea/vomiting: Yes, nausea Numbness/tingling: Yes, RUE Focal weakness: Yes, RUE weakness (NCV ordered); Dysarthria/dysphagia/drop attacks: Yes, pt reports some difficulty with pronouncing words  Has patient fallen in last 6 months? Yes, Number of falls: 1 (no injury) Pertinent pain: Yes, neck pain; Dominant hand: right Imaging: Yes, see history; Prior level of function: Independent Occupational demands: Not currently working; Hobbies: play with niece and granddaughter, assisting her husbands business; Red Flags: History of breast cancer 2022, hot flashes, Negative for bowel/bladder changes, chills/fever,unexplained weight loss/gain  PRECAUTIONS: None  WEIGHT BEARING RESTRICTIONS No  LIVING ENVIRONMENT:Lives with: lives with their spouse  PATIENT GOALSDecrease dizziness   OBJECTIVE EXAMINATION  POSTURE: No gross deficits contributing to  symptoms  NEUROLOGICAL SCREEN: (2+ unless otherwise noted.) N=normal  Ab=abnormal Deferred  CRANIAL NERVES Deferred  COORDINATION Deferred   RANGE OF MOTION Deferred  MANUAL MUSCLE TESTING BUE/BLE strength WNL without focal deficits  TRANSFERS/GAIT Independent for transfers and ambulation without assistive device   PATIENT SURVEYS ABC: 8.1% DHI: 72/100  OCULOMOTOR / VESTIBULAR TESTING  Oculomotor Exam- Room Light Deferred  Oculomotor Exam- Fixation Suppressed Deferred  BPPV TESTS:  Symptoms Duration Intensity Nystagmus  L Dix-Hallpike None   None  R Dix-Hallpike None   None  L Head Roll None   None  R Head Roll None   None  L Sidelying Test      R Sidelying Test      (blank = not tested)  Clinical Test of Sensory Interaction for Balance (CTSIB): Deferred  FUNCTIONAL OUTCOME MEASURES  Results Comments  BERG    DGI    FGA    TUG    5TSTS    6 Minute Walk Test    10 Meter Gait Speed    (blank = not tested)  TODAY'S TREATMENT   SUBJECTIVE: Pt reports that he is doing well today. No changes since the initial evaluation/last therapy session. Denies pain. No specific questions or concerns.   PAIN:  Ther-ex   Neuromuscular Re-education  oculomotor/vestibular testing with/without fixation suppression, cranial nerve and coordination testing, balance testing (BERG, FGA), initiate vestibular therapy and balance interventions as appropriate.  CRANIAL NERVES II, III, IV, VI: Pupils equal and reactive to light, visual acuity and visual fields are intact, extraocular muscles are intact  V: Facial sensation is intact and symmetric bilaterally  VII: Facial strength is intact and symmetric bilaterally  VIII: Hearing is normal as tested by gross conversation IX, X: Palate elevates midline, normal phonation, uvula midline XI: Shoulder shrug strength is intact  XII: Tongue protrudes midline   COORDINATION Finger to Nose: Normal Heel to Shin: Normal Pronator  Drift: Negative Rapid Alternating Movements: Normal Finger to Thumb Opposition: Normal  OCULOMOTOR / VESTIBULAR TESTING  Oculomotor Exam- Room Light  Findings Comments  Ocular Alignment {normal/abnormal/not examined:14677}   Ocular ROM {normal/abnormal/not examined:14677}   Spontaneous Nystagmus {normal/abnormal/not examined:14677}   Gaze-Holding Nystagmus {normal/abnormal/not examined:14677}   End-Gaze Nystagmus {normal/abnormal/not examined:14677}   Vergence (normal 2-3) {normal/abnormal/not examined:14677}   Smooth Pursuit {normal/abnormal/not examined:14677}   Cross-Cover Test {normal/abnormal/not examined:14677}   Saccades {normal/abnormal/not examined:14677}   VOR Cancellation {normal/abnormal/not examined:14677}   Left Head Impulse {normal/abnormal/not examined:14677}   Right Head Impulse {normal/abnormal/not examined:14677}   Static Acuity {normal/abnormal/not examined:14677}   Dynamic Acuity {normal/abnormal/not examined:14677}    Oculomotor Exam- Fixation Suppressed  Findings Comments  Ocular Alignment {normal/abnormal/not examined:14677}   Spontaneous Nystagmus {normal/abnormal/not examined:14677}   Gaze-Holding Nystagmus {normal/abnormal/not examined:14677}   End-Gaze Nystagmus {normal/abnormal/not examined:14677}   Head Shaking Nystagmus {normal/abnormal/not examined:14677}   Pressure-Induced Nystagmus {normal/abnormal/not examined:14677}   Hyperventilation Induced Nystagmus {normal/abnormal/not examined:14677}   Skull Vibration Induced Nystagmus {normal/abnormal/not examined:14677}    FUNCTIONAL OUTCOME MEASURES  Results Comments  BERG    DGI    FGA    TUG    5TSTS    6 Minute Walk Test    10 Meter Gait Speed    (blank = not tested)  Manual Therapy    PATIENT EDUCATION:  Education details: Plan of care Person educated: Patient Education method: Explanation Education comprehension: verbalized understanding  HOME EXERCISE PROGRAM:  None  currently  ASSESSMENT: CLINICAL IMPRESSION: Initiated balance and strength exercises during session today with patient. Issued HEP and reviewed with patient. Pt encouraged to follow-up as scheduled. Pt will benefit from PT services to address deficits in strength, balance, and mobility in order to return to full function at home and decrease his risk for falls.    Patient is a 58 y.o. female who was seen today for physical therapy evaluation and treatment for dizziness. Due to a complex history her examination was extremely limited however BPPV testing was negative. Plan to perform much more extensive testing during her first follow-up visit. She has an extremely low ABC Scale score and her DHI of 72/100 indicates severe disability related to her dizziness. Pt sent home with [place] Post Concussion Symptom Questionnaire to bring back at her first follow-up visit. Pt will benefit from PT services to address deficits in dizziness and imbalance in order to decrease symptoms and improve function at home.    OBJECTIVE IMPAIRMENTS: decreased balance and dizziness.   ACTIVITY LIMITATIONS: bending and caring for others  PARTICIPATION LIMITATIONS: meal prep, cleaning, laundry, shopping, and community activity  PERSONAL FACTORS: Behavior pattern, Past/current experiences, Time since onset of injury/illness/exacerbation, and 3+ comorbidities: anxiety, depression, breast CA, cervical spine fractures, and migraines are also affecting patient's functional outcome.   REHAB POTENTIAL: Fair    CLINICAL DECISION MAKING: Unstable/unpredictable  EVALUATION COMPLEXITY: High   GOALS:  SHORT  TERM GOALS: Target date: 11/16/2023  Pt will be independent with HEP for dizziness in order to decrease symptoms, improve balance,decrease fall risk, and improve function at home. Baseline: Goal status: INITIAL   LONG TERM GOALS: Target date: 12/14/2023  Pt will report at least 75% improvement in dizziness in order to  improve symptom free function with household responsibility and when spending time with her family.  Baseline:  Goal status: INITIAL  2.  Pt will decrease DHI score by at least 18 points in order to demonstrate clinically significant reduction in disability related to dizziness.  Baseline: 72/100 Goal status: INITIAL  3.  Pt will improve ABC by at least 13% in order to demonstrate clinically significant improvement in balance confidence.      Baseline: 8.1% Goal status: INITIAL  4. Pt will improve FGA by at least 4 points in order to demonstrate clinically significant improvement in balance and decreased risk for falls.     Baseline: To be performed Goal status: INITIAL   PLAN: PT FREQUENCY: 1x/week  PT DURATION: 8 weeks  PLANNED INTERVENTIONS: Therapeutic exercises, Therapeutic activity, Neuromuscular re-education, Balance training, Gait training, Patient/Family education, Self Care, Vestibular training, Canalith repositioning, Dry Needling, Electrical stimulation, Spinal manipulation, Cryotherapy, Moist heat, Manual therapy, and Re-evaluation.  PLAN FOR NEXT SESSION: oculomotor/vestibular testing with/without fixation suppression, cranial nerve and coordination testing, balance testing (BERG, FGA), initiate vestibular therapy and balance interventions as appropriate.   Liron Eissler D Jahmir Salo PT, DPT, GCS  Vinny Taranto, PT 11/15/2023, 10:40 PM

## 2023-11-16 ENCOUNTER — Ambulatory Visit: Attending: Physical Medicine & Rehabilitation

## 2023-11-16 DIAGNOSIS — R42 Dizziness and giddiness: Secondary | ICD-10-CM

## 2023-11-19 ENCOUNTER — Other Ambulatory Visit (HOSPITAL_COMMUNITY): Payer: Self-pay

## 2023-11-19 ENCOUNTER — Encounter: Payer: Self-pay | Admitting: Internal Medicine

## 2023-11-19 ENCOUNTER — Telehealth: Payer: Self-pay

## 2023-11-19 NOTE — Telephone Encounter (Signed)
*  Pulm  Pharmacy Patient Advocate Encounter   Received notification from Fax that prior authorization for Zolpidem  12.5mg  ER is required/requested.   Insurance verification completed.   The patient is insured through [employer] [facility] [employer] [facility] [employer] .   Per test claim: PA required; PA started via CoverMyMeds. KEY N/A (not started) . Please see clinical question(s) below that I am not finding the answer to in their chart and advise.   *Insurance will cover 0.5 tablets per day with no PA required. Please give reasoning that patient needs a higher dose/quantity than covered

## 2023-11-25 ENCOUNTER — Encounter (HOSPITAL_BASED_OUTPATIENT_CLINIC_OR_DEPARTMENT_OTHER): Payer: Self-pay | Admitting: Surgery

## 2023-11-25 ENCOUNTER — Other Ambulatory Visit: Payer: Self-pay

## 2023-11-27 NOTE — Anesthesia Preprocedure Evaluation (Addendum)
 Anesthesia Evaluation  Patient identified by MRN, date of birth, ID band Patient awake    Reviewed: Allergy & Precautions, NPO status , Patient's Chart, lab work & pertinent test results  History of Anesthesia Complications Negative for: history of anesthetic complications  Airway Mallampati: II  TM Distance: >3 FB Neck ROM: Full    Dental  (+) Dental Advisory Given   Pulmonary neg shortness of breath, asthma , sleep apnea (patient denies, no cpap) , neg COPD, neg recent URI   Pulmonary exam normal breath sounds clear to auscultation       Cardiovascular hypertension, Pt. on medications (-) angina (-) Past MI, (-) Cardiac Stents and (-) CABG (-) dysrhythmias + Valvular Problems/Murmurs  Rhythm:Regular Rate:Normal     Neuro/Psych  Headaches, neg Seizures PSYCHIATRIC DISORDERS Anxiety Depression       GI/Hepatic Neg liver ROS, PUD,neg GERD  ,,(+)       alcohol useS/p gastric bypass   Endo/Other  neg diabetes  Class 3 obesity (BMI 44)  Renal/GU negative Renal ROS  negative genitourinary   Musculoskeletal  (+) Arthritis , Osteoarthritis,    Abdominal  (+) + obese  Peds  Hematology negative hematology ROS (+) Lab Results      Component                Value               Date                      WBC                      5.8                 09/24/2023                HGB                      13.4                09/24/2023                HCT                      41.5                09/24/2023                MCV                      87.2                09/24/2023                PLT                      275                 09/24/2023              Anesthesia Other Findings Zepbound : 11/22/2023  Reproductive/Obstetrics negative OB ROS                              Anesthesia Physical Anesthesia Plan  ASA: 3  Anesthesia Plan: General   Post-op Pain Management: Tylenol  PO (pre-op)* and Toradol   IV (intra-op)*  Induction: Intravenous  PONV Risk Score and Plan: 3 and Ondansetron , Midazolam , Dexamethasone  and Treatment may vary due to age or medical condition  Airway Management Planned: LMA  Additional Equipment: None  Intra-op Plan:   Post-operative Plan: Extubation in OR  Informed Consent: I have reviewed the patients History and Physical, chart, labs and discussed the procedure including the risks, benefits and alternatives for the proposed anesthesia with the patient or authorized representative who has indicated his/her understanding and acceptance.     Dental advisory given  Plan Discussed with: CRNA and Anesthesiologist  Anesthesia Plan Comments: (Risks of general anesthesia discussed including, but not limited to, sore throat, hoarse voice, chipped/damaged teeth, injury to vocal cords, nausea and vomiting, allergic reactions, lung infection, heart attack, stroke, and death. All questions answered. )         Anesthesia Quick Evaluation

## 2023-11-30 ENCOUNTER — Encounter (HOSPITAL_BASED_OUTPATIENT_CLINIC_OR_DEPARTMENT_OTHER)
Admission: RE | Admit: 2023-11-30 | Discharge: 2023-11-30 | Disposition: A | Discharge status: Home / Self Care | Source: Ambulatory Visit | Attending: Surgery

## 2023-11-30 DIAGNOSIS — Z01818 Encounter for other preprocedural examination: Secondary | ICD-10-CM | POA: Insufficient documentation

## 2023-11-30 LAB — BASIC METABOLIC PANEL WITH GFR
Anion gap: 13 (ref 5–15)
BUN: 10 mg/dL (ref 6–20)
CO2: 29 mmol/L (ref 22–32)
Calcium: 9.3 mg/dL (ref 8.9–10.3)
Chloride: 96 mmol/L — ABNORMAL LOW (ref 98–111)
Creatinine, Ser: 0.79 mg/dL (ref 0.44–1.00)
GFR, Estimated: >60 mL/min (ref >60)
Glucose, Bld: 86 mg/dL (ref 70–99)
Potassium: 3.9 mmol/L (ref 3.5–5.1)
Sodium: 138 mmol/L (ref 135–145)

## 2023-11-30 MED ORDER — CHLORHEXIDINE GLUCONATE CLOTH 2 % EX PADS: 6.0000 | MEDICATED_PAD | Freq: Once | CUTANEOUS | Status: DC

## 2023-11-30 NOTE — Progress Notes (Signed)

## 2023-12-01 NOTE — H&P (Signed)
 History of Present Illness: Tracy [name] is a 58 y.o. female who is seen today as an office consultation for evaluation of New Consultation ( EVAL OF RT AXILLARY NODULE, LT BR CA SX 02/25/2021/TC,)  Patient presents for evaluation of a right axillary mass. She has had it for 2 months. Location is right axilla and its causing some itching and discomfort. It also rubs in her armpit. No redness but there is a little bit of drainage she notices from a small black spot on top of it. The pain is minimal but the itching is significant and constant.  Review of Systems: A complete review of systems was obtained from the patient. I have reviewed this information and discussed as appropriate with the patient. See HPI as well for other ROS.    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 Charlean  Asthma (HHS-HCC)  Anxiety and depression  Iron  deficiency anemia due to chronic blood loss: sees Dr. 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 (CMS/HHS-HCC)  Pinched vertebral nerve  ADD (attention deficit disorder) without hyperactivity  Severe obesity (BMI >= 40) (CMS/HHS-HCC)  Atrophic vaginitis  Cervicalgia  Closed nondisplaced fracture of sixth cervical vertebra with routine healing  Elevated blood pressure reading  Hyperglycemia  Insulin  resistance  Lactose intolerance  Menopausal syndrome  Other constipation  Other fatigue  Polyphagia  Right lower quadrant pain  S/P gastric bypass  Vitamin D  deficiency  SOBOE (shortness of breath on exertion)   Past Surgical History:  Procedure Laterality Date  CHOLECYSTECTOMY  ENDOMETRIAL ABLATION W/ NOVASURE  FRACTURE SURGERY 2020  Ankle  GASTRIC BYPASS OPEN  HYSTERECTOMY  perferrated ulcer  TONSILLECTOMY    Allergies  Allergen Reactions  Ibuprofen  Other (See Comments)  Perforated ulcer  Perforated ulcer Perforated ulcer Perforated ulcer  Morphine  Hives  Amitriptyline  Other (See Comments)  Behavior changes  Trazodone Other (See Comments)  Didn't work for sleep Didn't work for sleep Causes headaches  Doxepin Other (See Comments)  sweats  Nsaids (Non-Steroidal Anti-Inflammatory Drug) Other (See Comments)  H/o ulcer  Tramadol Nausea   Current Outpatient Medications on File Prior to Visit  Medication Sig Dispense Refill  albuterol  MDI, PROVENTIL , VENTOLIN , PROAIR , HFA 90 mcg/actuation inhaler Inhale 2 inhalations into the lungs every 4 (four) hours as needed for Wheezing 6.7 g 2  ALPRAZolam  (  XANAX  XR) 1 MG 24 hr tablet Take 1 mg by mouth every morning  azelaic acid (FINACEA) 15 % topical gel Apply topically 2 (two) times daily After skin is thoroughly washed and patted  dry, gently but thoroughly massage a thin film of azelaic acid cream into the affected area twice daily, in the morning and evening. 30 g 2  celecoxib (CELEBREX) 100 MG capsule TAKE 1 CAPSULE(100 MG) BY MOUTH DAILY 30 capsule 0  cetirizine (ZYRTEC) 10 mg capsule Take 10 mg by mouth once daily Reported on 07/29/2015  cyanocobalamin  (VITAMIN B12) 1,000 mcg/mL injection  desvenlafaxine succinate (PRISTIQ) 100 MG ER tablet Take 100 mg by mouth once daily  dextroamphetamine-amphetamine (ADDERALL XR) 30 MG XR capsule 40 mg  ferrous sulfate 325 (65 FE) MG tablet Take 1 tablet (325 mg total) by mouth daily with breakfast 30 tablet 11  fluocinolone-hydroq-tretinoin (TRI-LUMA) 0.01-4-0.05 % topical cream Apply topically at bedtime for 182 days 30 g 2  fremanezumab-vfrm 225 mg/1.5 mL AtIn Inject 225 mg subcutaneously monthly 1.5 mL 6  hydroCHLOROthiazide (HYDRODIURIL) 12.5 MG tablet TAKE 1 TABLET(12.5 MG) BY MOUTH EVERY DAY 100 tablet 1  hydroquinone 4 % cream Apply topically at bedtime 28 g 0  lamoTRIgine  (LAMICTAL ) 100 MG tablet Take 100 mg by mouth every 12 (twelve) hours  lisdexamfetamine (VYVANSE) 40 MG capsule  lisdexamfetamine (VYVANSE) 50 MG capsule Take 50 mg by mouth once daily  lurasidone (LATUDA) 60 mg tablet Take 60 mg by mouth once daily  lurasidone (LATUDA) 80 mg Tab tablet Take 80 mg by mouth once daily  metFORMIN  (GLUCOPHAGE ) 500 MG tablet Take 1 tablet (500 mg total) by mouth 2 (two) times daily with meals 180 tablet 3  nebulizer and compressor Devi And accessories. Use neb q4 prn 1 each 0  oxyCODONE  (ROXICODONE ) 5 MG immediate release tablet Take 1 tablet (5 mg total) by mouth every 6 (six) hours as needed for Pain for up to 40 doses 40 tablet 0  PREVIDENT 5000 SENSITIVE 1.1-5 % use as directed by prescriber 0  semaglutide  (OZEMPIC) 0.25 mg or 0.5 mg (2 mg/3 mL) pen injector Inject 0.375 mLs (0.25 mg total) subcutaneously once a week E66.09 2 mL 1  topiramate  (TOPAMAX ) 50 MG tablet Take 1  tablet (50 mg total) by mouth 3 (three) times a day 270 tablet 3  traMADoL (ULTRAM) 50 mg tablet Take 1 tablet (50 mg total) by mouth every 6 (six) hours as needed for Pain for up to 30 doses 30 tablet 0  tretinoin (RETIN-A) 0.05 % cream Apply topically at bedtime 45 g 0  valACYclovir (VALTREX) 1000 MG tablet TAKE 2 TABLETS(2000 MG) BY MOUTH TWICE DAILY 60 tablet 11  doxycycline (MONODOX) 100 MG capsule Take 1 capsule (100 mg total) by mouth 2 (two) times daily for 30 days 60 capsule 2   Current Facility-Administered Medications on File Prior to Visit  Medication Dose Route Frequency Provider Last Rate Last Admin  cyanocobalamin  (VITAMIN B12) injection 1,000 mcg 1,000 mcg Intramuscular Q30 Days Jose-Mathews, Jessnie, MD 1,000 mcg at 08/24/23 9056   Family History  Problem Relation Age of Onset  COPD Mother  Anxiety Mother  Depression Mother  Diabetes type II Mother  Obesity Mother  Crohn's disease Mother  Asthma Mother  Coronary Artery Disease (Blocked arteries around heart) Father 51  Diabetes type II Father  High blood pressure (Hypertension) Father  Obesity Sister  Breast cancer Maternal Aunt 50  Depression Maternal Grandmother  Obesity Maternal Grandmother  Cancer Maternal Grandmother  stomach  Coronary Artery Disease (Blocked arteries around heart) Maternal Grandfather  Coronary Artery Disease (Blocked arteries around heart) Paternal Grandmother  Glaucoma Paternal Grandmother  Hip fracture Paternal Grandmother  High blood pressure (Hypertension) Paternal Grandmother  Osteoarthritis Paternal Grandmother  Skin cancer Paternal Grandmother  Stroke Paternal Grandmother  Alcohol abuse Paternal Grandfather  Leukemia Paternal Grandfather  Pancreatic cancer Sister  Breast cancer Maternal Aunt 45  No Known Problems Brother  Alcohol abuse Brother  No Known Problems Maternal Uncle  Obesity Sister  Lupus Neg Hx  Rheum arthritis Neg Hx  Gout Neg Hx    Social History    Tobacco Use  Smoking Status Never  Smokeless Tobacco Never  Tobacco Comments  nanny    Social History   Socioeconomic History  Marital status: Married  Tobacco Use  Smoking status: Never  Smokeless tobacco: Never  Tobacco comments:  nanny  Vaping Use  Vaping status: Never Used  Substance and Sexual Activity  Alcohol use: Not Currently  Comment: occ  Drug use: No  Sexual activity: Yes  Partners: Male  Birth control/protection: Surgical  Social History Narrative  Married in 2007. Job: Social worker. Exercise: walks with kids. Wears seatbelt, sunscreen. Dentist: yes. Calcium in diet: yes. Religious beliefs affecting health care: none.   Advanced Directives: will talk with hubby.   Social Drivers of Corporate investment banker Strain: Low Risk (05/24/2023)  Overall Financial Resource Strain (CARDIA)  Difficulty of Paying Living Expenses: Not hard at all  Food Insecurity: No Food Insecurity (05/24/2023)  Hunger Vital Sign  Worried About Running Out of Food in the Last Year: Never true  Ran Out of Food in the Last Year: Never true  Transportation Needs: No Transportation Needs (05/24/2023)  PRAPARE - Risk analyst (Medical): No  Lack of Transportation (Non-Medical): No  Received from [facility]  Social Network  Housing Stability: Low Risk (05/24/2023)  Housing Stability Vital Sign  Unable to Pay for Housing in the Last Year: No  Number of Times Moved in the Last Year: 0  Homeless in the Last Year: No   Objective:   Vitals:  11/05/23 0928  BP: 134/73  Pulse: 87  Temp: 36.6 C (97.9 F)  TempSrc: Tympanic  SpO2: 97%  Weight: (!) 122.2 kg (269 lb 6.4 oz)  Height: 165.1 cm (5' 5)  PainSc: 0-No pain   Body mass index is 44.83 kg/m.  Physical Exam Vitals reviewed. Exam conducted with a chaperone present (brooks).  Chest:   Comments: Left breast incisions well-healed. No left breast mass.  6 x 7 cm right axillary subcutaneous mass  mobile consistent with lipoma. Small sebaceous cyst overlying this. No fluctuance or redness. Skin: General: Skin is warm.  Neurological:  General: No focal deficit present.  Mental Status: She is alert.  Psychiatric:  Mood and Affect: Mood normal.     Assessment and Plan:   Diagnoses and all orders for this visit:  Lipoma of right axilla   Patient desires excision due to itching and discomfort. Risks and benefits of removing the right axilla lipoma reviewed. Risk of bleeding, infection, recurrence, injury to neighboring vessels and/or organs, pain, numbness, shoulder stiffness, and the need further treatments and or procedures.   DEBBY CURTISTINE SHIPPER, MD

## 2023-12-02 ENCOUNTER — Encounter (HOSPITAL_BASED_OUTPATIENT_CLINIC_OR_DEPARTMENT_OTHER)
Admission: RE | Disposition: A | Discharge status: Home / Self Care | Payer: Self-pay | Source: Home / Self Care | Attending: Surgery

## 2023-12-02 ENCOUNTER — Other Ambulatory Visit: Payer: Self-pay | Admitting: Pulmonary Disease

## 2023-12-02 ENCOUNTER — Ambulatory Visit (HOSPITAL_BASED_OUTPATIENT_CLINIC_OR_DEPARTMENT_OTHER): Payer: Self-pay | Admitting: Anesthesiology

## 2023-12-02 ENCOUNTER — Ambulatory Visit (HOSPITAL_BASED_OUTPATIENT_CLINIC_OR_DEPARTMENT_OTHER)
Admission: RE | Admit: 2023-12-02 | Discharge: 2023-12-02 | Disposition: A | Discharge status: Home / Self Care | Attending: Surgery | Admitting: Surgery

## 2023-12-02 ENCOUNTER — Other Ambulatory Visit: Payer: Self-pay

## 2023-12-02 ENCOUNTER — Encounter (HOSPITAL_BASED_OUTPATIENT_CLINIC_OR_DEPARTMENT_OTHER): Payer: Self-pay | Admitting: Surgery

## 2023-12-02 DIAGNOSIS — L720 Epidermal cyst: Secondary | ICD-10-CM

## 2023-12-02 DIAGNOSIS — F419 Anxiety disorder, unspecified: Secondary | ICD-10-CM | POA: Insufficient documentation

## 2023-12-02 DIAGNOSIS — J45909 Unspecified asthma, uncomplicated: Secondary | ICD-10-CM | POA: Diagnosis not present

## 2023-12-02 DIAGNOSIS — Z79899 Other long term (current) drug therapy: Secondary | ICD-10-CM | POA: Diagnosis not present

## 2023-12-02 DIAGNOSIS — F32A Depression, unspecified: Secondary | ICD-10-CM | POA: Insufficient documentation

## 2023-12-02 DIAGNOSIS — Z01818 Encounter for other preprocedural examination: Secondary | ICD-10-CM

## 2023-12-02 DIAGNOSIS — F418 Other specified anxiety disorders: Secondary | ICD-10-CM

## 2023-12-02 DIAGNOSIS — I10 Essential (primary) hypertension: Secondary | ICD-10-CM

## 2023-12-02 DIAGNOSIS — D1721 Benign lipomatous neoplasm of skin and subcutaneous tissue of right arm: Secondary | ICD-10-CM | POA: Diagnosis present

## 2023-12-02 DIAGNOSIS — G4730 Sleep apnea, unspecified: Secondary | ICD-10-CM | POA: Diagnosis not present

## 2023-12-02 HISTORY — PX: EXCISION, MASS, UPPER EXTREMITY: SHX7567

## 2023-12-02 SURGERY — EXCISION, MASS, UPPER EXTREMITY
Anesthesia: General | Site: Axilla | Laterality: Right

## 2023-12-02 MED ORDER — ACETAMINOPHEN 500 MG PO TABS
1000.0000 mg | ORAL_TABLET | Freq: Once | ORAL | Status: AC
  Administered 2023-12-02: 1000 mg via ORAL

## 2023-12-02 MED ORDER — OXYCODONE HCL 5 MG PO TABS
ORAL_TABLET | ORAL | Status: AC
  Filled 2023-12-02: qty 1

## 2023-12-02 MED ORDER — LIDOCAINE HCL (CARDIAC) PF 100 MG/5ML IV SOSY
PREFILLED_SYRINGE | INTRAVENOUS | Status: DC | PRN
  Administered 2023-12-02: 100 mg via INTRAVENOUS

## 2023-12-02 MED ORDER — BUPIVACAINE-EPINEPHRINE 0.25% -1:200000 IJ SOLN
INTRAMUSCULAR | Status: DC | PRN
  Administered 2023-12-02: 20 mL

## 2023-12-02 MED ORDER — ACETAMINOPHEN 500 MG PO TABS
ORAL_TABLET | ORAL | Status: AC
  Filled 2023-12-02: qty 2

## 2023-12-02 MED ORDER — DEXAMETHASONE SODIUM PHOSPHATE 4 MG/ML IJ SOLN
INTRAMUSCULAR | Status: DC | PRN
  Administered 2023-12-02: 5 mg via INTRAVENOUS

## 2023-12-02 MED ORDER — LACTATED RINGERS IV SOLN
INTRAVENOUS | Status: DC
Start: 2023-12-02 — End: 2023-12-02

## 2023-12-02 MED ORDER — PROPOFOL 10 MG/ML IV BOLUS
INTRAVENOUS | Status: AC
  Filled 2023-12-02: qty 20

## 2023-12-02 MED ORDER — LIDOCAINE 2% (20 MG/ML) 5 ML SYRINGE
INTRAMUSCULAR | Status: AC
Start: 2023-12-02 — End: 2023-12-02
  Filled 2023-12-02: qty 5

## 2023-12-02 MED ORDER — AMISULPRIDE (ANTIEMETIC) 5 MG/2ML IV SOLN: 10.0000 mg | Freq: Once | INTRAVENOUS | Status: DC | PRN

## 2023-12-02 MED ORDER — MIDAZOLAM HCL 2 MG/2ML IJ SOLN
INTRAMUSCULAR | Status: AC
  Filled 2023-12-02: qty 2

## 2023-12-02 MED ORDER — HYDROMORPHONE HCL 1 MG/ML IJ SOLN: 0.2500 mg | INTRAMUSCULAR | Status: DC | PRN

## 2023-12-02 MED ORDER — MIDAZOLAM HCL 5 MG/5ML IJ SOLN
INTRAMUSCULAR | Status: DC | PRN
  Administered 2023-12-02: 2 mg via INTRAVENOUS

## 2023-12-02 MED ORDER — OXYCODONE HCL 5 MG PO TABS
5.0000 mg | ORAL_TABLET | Freq: Once | ORAL | Status: AC | PRN
  Administered 2023-12-02: 5 mg via ORAL

## 2023-12-02 MED ORDER — CEFAZOLIN SODIUM-DEXTROSE 2-4 GM/100ML-% IV SOLN
INTRAVENOUS | Status: AC
Start: 2023-12-02 — End: 2023-12-02
  Filled 2023-12-02: qty 100

## 2023-12-02 MED ORDER — CEFAZOLIN SODIUM-DEXTROSE 3-4 GM/150ML-% IV SOLN
INTRAVENOUS | Status: AC
Start: 2023-12-02 — End: 2023-12-02
  Filled 2023-12-02: qty 150

## 2023-12-02 MED ORDER — DEXAMETHASONE SODIUM PHOSPHATE 10 MG/ML IJ SOLN
INTRAMUSCULAR | Status: AC
  Filled 2023-12-02: qty 1

## 2023-12-02 MED ORDER — ONDANSETRON HCL 4 MG/2ML IJ SOLN: 4.0000 mg | Freq: Once | INTRAMUSCULAR | Status: DC | PRN

## 2023-12-02 MED ORDER — OXYCODONE HCL 5 MG PO TABS: 5.0000 mg | ORAL_TABLET | Freq: Four times a day (QID) | ORAL | 0 refills | Status: AC | PRN

## 2023-12-02 MED ORDER — OXYCODONE HCL 5 MG/5ML PO SOLN: 5.0000 mg | Freq: Once | ORAL | Status: AC | PRN

## 2023-12-02 MED ORDER — ZOLPIDEM TARTRATE ER 12.5 MG PO TBCR: 12.5000 mg | EXTENDED_RELEASE_TABLET | Freq: Every day | ORAL | 1 refills | Status: AC

## 2023-12-02 MED ORDER — ONDANSETRON HCL 4 MG/2ML IJ SOLN
INTRAMUSCULAR | Status: DC | PRN
  Administered 2023-12-02: 4 mg via INTRAVENOUS

## 2023-12-02 MED ORDER — CEFAZOLIN SODIUM-DEXTROSE 3-4 GM/150ML-% IV SOLN
3.0000 g | INTRAVENOUS | Status: AC
Start: 2023-12-02 — End: 2023-12-02
  Administered 2023-12-02: 3 g via INTRAVENOUS

## 2023-12-02 MED ORDER — ONDANSETRON HCL 4 MG/2ML IJ SOLN
INTRAMUSCULAR | Status: AC
  Filled 2023-12-02: qty 2

## 2023-12-02 MED ORDER — FENTANYL CITRATE (PF) 100 MCG/2ML IJ SOLN
INTRAMUSCULAR | Status: DC | PRN
  Administered 2023-12-02: 50 ug via INTRAVENOUS
  Administered 2023-12-02 (×2): 25 ug via INTRAVENOUS

## 2023-12-02 MED ORDER — 0.9 % SODIUM CHLORIDE (POUR BTL) OPTIME
TOPICAL | Status: DC | PRN
  Administered 2023-12-02: 50 mL

## 2023-12-02 MED ORDER — FENTANYL CITRATE (PF) 100 MCG/2ML IJ SOLN
INTRAMUSCULAR | Status: AC
  Filled 2023-12-02: qty 2

## 2023-12-02 MED ORDER — PROPOFOL 10 MG/ML IV BOLUS
INTRAVENOUS | Status: DC | PRN
  Administered 2023-12-02: 50 mg via INTRAVENOUS
  Administered 2023-12-02: 200 mg via INTRAVENOUS

## 2023-12-02 SURGICAL SUPPLY — 34 items
BENZOIN TINCTURE PRP APPL 2/3 (GAUZE/BANDAGES/DRESSINGS) IMPLANT
BLADE SURG 10 STRL SS (BLADE) IMPLANT
BLADE SURG 15 STRL LF DISP TIS (BLADE) ×1 IMPLANT
BNDG ELASTIC 4INX 5YD STR LF (GAUZE/BANDAGES/DRESSINGS) IMPLANT
CANISTER SUCT 1200ML W/VALVE (MISCELLANEOUS) IMPLANT
CHLORAPREP W/TINT 26 (MISCELLANEOUS) ×2 IMPLANT
COVER BACK TABLE 60X90IN (DRAPES) ×2 IMPLANT
COVER MAYO STAND STRL (DRAPES) ×2 IMPLANT
DERMABOND ADVANCED .7 DNX12 (GAUZE/BANDAGES/DRESSINGS) IMPLANT
DRAPE LAPAROTOMY 100X72 PEDS (DRAPES) ×2 IMPLANT
DRAPE UTILITY XL STRL (DRAPES) ×1 IMPLANT
ELECT COATED BLADE 2.86 ST (ELECTRODE) ×1 IMPLANT
ELECTRODE REM PT RTRN 9FT ADLT (ELECTROSURGICAL) ×2 IMPLANT
GLOVE BIOGEL PI IND STRL 8 (GLOVE) ×1 IMPLANT
GLOVE ECLIPSE 8.0 STRL XLNG CF (GLOVE) ×2 IMPLANT
GOWN STRL REUS W/ TWL LRG LVL3 (GOWN DISPOSABLE) ×4 IMPLANT
GOWN STRL REUS W/ TWL XL LVL3 (GOWN DISPOSABLE) ×1 IMPLANT
NDL HYPO 25X1 1.5 SAFETY (NEEDLE) ×2 IMPLANT
NEEDLE HYPO 25X1 1.5 SAFETY (NEEDLE) ×1 IMPLANT
NS IRRIG 1000ML POUR BTL (IV SOLUTION) IMPLANT
PACK BASIN DAY SURGERY FS (CUSTOM PROCEDURE TRAY) ×2 IMPLANT
PENCIL SMOKE EVACUATOR (MISCELLANEOUS) ×2 IMPLANT
SLEEVE SCD COMPRESS KNEE MED (STOCKING) ×2 IMPLANT
SPIKE FLUID TRANSFER (MISCELLANEOUS) IMPLANT
SPONGE T-LAP 4X18 ~~LOC~~+RFID (SPONGE) IMPLANT
STAPLER SKIN PROX WIDE 3.9 (STAPLE) IMPLANT
STRIP CLOSURE SKIN 1/2X4 (GAUZE/BANDAGES/DRESSINGS) IMPLANT
SUT MON AB 4-0 PC3 18 (SUTURE) ×2 IMPLANT
SUT VICRYL 3-0 CR8 SH (SUTURE) ×1 IMPLANT
SUT VICRYL AB 3 0 TIES (SUTURE) IMPLANT
SYR CONTROL 10ML LL (SYRINGE) ×2 IMPLANT
TOWEL GREEN STERILE FF (TOWEL DISPOSABLE) ×2 IMPLANT
TUBE CONNECTING 20X1/4 (TUBING) IMPLANT
YANKAUER SUCT BULB TIP NO VENT (SUCTIONS) IMPLANT

## 2023-12-02 NOTE — Anesthesia Procedure Notes (Signed)
 Procedure Name: LMA Insertion Date/Time: 12/02/2023 7:33 AM  Performed by: Pam Macario BROCKS, CRNAPre-anesthesia Checklist: Patient identified, Emergency Drugs available, Suction available, Patient being monitored and Timeout performed Patient Re-evaluated:Patient Re-evaluated prior to induction Oxygen Delivery Method: Circle system utilized Preoxygenation: Pre-oxygenation with 100% oxygen Induction Type: IV induction Ventilation: Mask ventilation without difficulty LMA: LMA inserted LMA Size: 4.0 Number of attempts: 1 Airway Equipment and Method: Bite block Placement Confirmation: positive ETCO2, breath sounds checked- equal and bilateral and CO2 detector Tube secured with: Tape Dental Injury: Teeth and Oropharynx as per pre-operative assessment

## 2023-12-02 NOTE — Discharge Instructions (Addendum)
 #######################################################  GENERAL SURGERY: POST OP INSTRUCTIONS  ######################################################################  EAT Gradually transition to a high fiber diet with a fiber supplement over the next few weeks after discharge.  Start with a pureed / full liquid diet (see below)  WALK Walk an hour a day.  Control your pain to do that.    CONTROL PAIN Control pain so that you can walk, sleep, tolerate sneezing/coughing, go up/down stairs.  HAVE A BOWEL MOVEMENT DAILY Keep your bowels regular to avoid problems.  OK to try a laxative to override constipation.  OK to use an antidairrheal to slow down diarrhea.  Call if not better after 2 tries  CALL IF YOU HAVE PROBLEMS/CONCERNS Call if you are still struggling despite following these instructions. Call if you have concerns not answered by these instructions  ######################################################################    DIET: Follow a light bland diet & liquids the first 24 hours after arrival home, such as soup, liquids, starches, etc.  Be sure to drink plenty of fluids.  Quickly advance to a usual solid diet within a few days.  Avoid fast food or heavy meals as your are more likely to get nauseated or have irregular bowels.  A low-fat, high-fiber diet for the rest of your life is ideal.    Take your usually prescribed home medications unless otherwise directed. Blood thinners:  You can restart any strong blood thinners after the second postoperative day  for example: COUMADIN (warfarin), XERELTO (rivaroxaban), ELIQUIS (apixaban), PLAVIX (clopidigrel), BRILINTA (ticagrelor), EFFIENT (prasugrel), PRADAXA (dabigatran), etc  Continue aspirin before & after surgery..     Some oozing/bleeding the first 1-2 weeks is common but should taper down & be small volume.    If you are passing many large clots or having uncontrolling bleeding, call your surgeon  PAIN CONTROL: Pain  is best controlled by a usual combination of three different methods TOGETHER: Ice/Heat Over the counter pain medication Prescription pain medication Most patients will experience some swelling and bruising around the incisions.  Ice packs or heating pads (30-60 minutes up to 6 times a day) will help. Use ice for the first few days to help decrease swelling and bruising, then switch to heat to help relax tight/sore spots and speed recovery.  Some people prefer to use ice alone, heat alone, alternating between ice & heat.  Experiment to what works for you.  Swelling and bruising can take several weeks to resolve.   It is helpful to take an over-the-counter pain medication regularly for the first few weeks.  Choose one of the following that works best for you: Naproxen (Aleve, etc)  Two 220mg  tabs twice a day Ibuprofen  (Advil , etc) Three 200mg  tabs four times a day (every meal & bedtime) Acetaminophen  (Tylenol , etc) 500-650mg  four times a day (every meal & bedtime) A  prescription for pain medication (such as oxycodone , hydrocodone , etc) should be given to you upon discharge.  Take your pain medication as prescribed.  If you are having problems/concerns with the prescription medicine (does not control pain, nausea, vomiting, rash, itching, etc), please call us  [phone] to see if we need to switch you to a different pain medicine that will work better for you and/or control your side effect better. If you need a refill on your pain medication, please contact your pharmacy.  They will contact our office to request authorization. Prescriptions will not be filled after 5 pm or on week-ends.  Avoid getting constipated.  Between the surgery and the pain medications, it is  common to experience some constipation.  Increasing fluid intake and taking a fiber supplement (such as Metamucil, Citrucel, FiberCon, MiraLax, etc) 1-2 times a day regularly will usually help prevent this problem from occurring.  A mild  laxative (prune juice, Milk of Magnesia, MiraLax, etc) should be taken according to package directions if there are no bowel movements after 48 hours.   Watch out for diarrhea.  If you have many loose bowel movements, simplify your diet to bland foods & liquids for a few days.  Stop any stool softeners and decrease your fiber supplement.  Switching to mild anti-diarrheal medications (Loperamide/Imodium, Kayopectate, Pepto Bismol) can help.  If this worsens or does not improve, please call us .  Wash / shower every day.  You may shower over the dressings as they are waterproof.  Continue to shower over incision(s) after the dressing is off. Remove your waterproof bandages 5 days after surgery.  You may leave the incision open to air.  You may have skin tapes (Steri Strips) covering the incision(s).  Leave them on until one week, then remove.  You may replace a dressing/Band-Aid to cover the incision for comfort if you wish.   ACTIVITIES as tolerated:   You may resume regular (light) daily activities beginning the next day--such as daily self-care, walking, climbing stairs--gradually increasing activities as tolerated.  If you can walk 30 minutes without difficulty, it is safe to try more intense activity such as jogging, treadmill, bicycling, low-impact aerobics, swimming, etc. Save the most intensive and strenuous activity for last such as sit-ups, heavy lifting, contact sports, etc  Refrain from any heavy lifting or straining until you are off narcotics for pain control.   DO NOT PUSH THROUGH PAIN.  Let pain be your guide: If it hurts to do something, don't do it.  Pain is your body warning you to avoid that activity for another week until the pain goes down. You may drive when you are no longer taking prescription pain medication, you can comfortably wear a seatbelt, and you can safely maneuver your car and apply brakes. You may have sexual intercourse when it is comfortable.   FOLLOW UP in our  office Please call [facility] at [phone] to set up an appointment to see your surgeon in the office for a follow-up appointment approximately 2-3 weeks after your surgery. Make sure that you call for this appointment the day you arrive home to insure a convenient appointment time.  9. IF YOU HAVE DISABILITY OR FAMILY LEAVE FORMS, BRING THEM TO THE OFFICE FOR PROCESSING.  DO NOT GIVE THEM TO YOUR DOCTOR.   WHEN TO CALL US  [phone]: Poor pain control Reactions / problems with new medications (rash/itching, nausea, etc)  Fever over 101.5 F (38.5 C) Worsening swelling or bruising Continued bleeding from incision. Increased pain, redness, or drainage from the incision Difficulty breathing / swallowing   The clinic staff is available to answer your questions during regular business hours (8:30am-5pm).  Please don't hesitate to call and ask to speak to one of our nurses for clinical concerns.   If you have a medical emergency, go to the nearest emergency room or call 911.  A surgeon from [facility] is always on call at the [facility], [place] [address], [place], [place]  [address] ? MAIN: [phone] ? TOLL FREE: [phone] ?  FAX [phone] [email or web address]  #######################################################    Post Anesthesia Home Care Instructions  Activity:  Get plenty of rest for the remainder of the day. A responsible individual must stay with you for 24 hours following the procedure.  For the next 24 hours, DO NOT: -Drive a car -Advertising copywriter -Drink alcoholic beverages -Take any medication unless instructed by your physician -Make any legal decisions or sign important papers.  Meals: Start with liquid foods such as gelatin or soup. Progress to regular foods as tolerated. Avoid greasy, spicy, heavy foods. If nausea and/or vomiting occur, drink only clear liquids until the nausea  and/or vomiting subsides. Call your physician if vomiting continues.  Special Instructions/Symptoms: Your throat may feel dry or sore from the anesthesia or the breathing tube placed in your throat during surgery. If this causes discomfort, gargle with warm salt water . The discomfort should disappear within 24 hours.  May have Tylenol  after 12:35pm if needed.

## 2023-12-02 NOTE — Op Note (Signed)
 Preoperative diagnosis: Right axillary mass measuring 6 x 7 cm subcutaneous with overlying 5 mm epidermal inclusion cyst ruptured  Postoperative diagnosis: Same  Procedure: Excision of right axillary mass and epidermal inclusion cyst remnant  Surgeon: Debby Shipper, MD  Anesthesia: LMA with 0.25% Marcaine  with epinephrine   EBL: 5 cc  Specimen: Mass as stated above  Drains: None  Indications for procedure: The patient is a 58 year old female with a soft tissue mass in the right axilla.  Causing some discomfort and irritation.  She also is a small draining remnant epidermal inclusion cyst that is already ruptured overlying the skin.  She presents today for excision of these due to symptoms of discomfort and irritation.The procedure has been discussed with the patient.  Alternative therapies have been discussed with the patient.  Operative risks include bleeding,  Infection,  Organ injury,  Nerve injury,  Blood vessel injury,  DVT,  Pulmonary embolism,  Death,  And possible reoperation.  Medical management risks include worsening of present situation.  The success of the procedure is 50 -90 % at treating patients symptoms.  The patient understands and agrees to proceed.      Description of procedure: The patient was met in the holding area and questions were answered.  I marked the mass and skin lesion with the assistance of the patient and verified proper location as well as procedure.  She was then taken back to the operating room and placed upon upon the OR table.  After induction of general anesthesia, the right axilla was prepped and draped in a sterile fashion and a timeout was performed.  Proper patient, site and procedure verified.  Local anesthetic was infiltrated in the skin ellipse was taken to encompass the area of the draining epidermal inclusion cyst.  This was at best 5 mm and most of it ruptured.  We extended this down to subcutaneous fat to excise what appeared to be a  multiloculated lipoma measuring 6 x 7 cm.  This was removed in its entirely with grossly negative margins.  The cavities made hemostatic with cautery and local anesthetic was infiltrated throughout.  After ensuring hemostasis, the deep tissue planes were approximated with 3-0 Vicryl.  4-0 Monocryl was used to close the skin in a subcuticular fashion.  Dermabond was applied.  All counts were found to be correct.  The patient was then awoke extubated taken to recovery in satisfactory condition.

## 2023-12-02 NOTE — Anesthesia Postprocedure Evaluation (Signed)
 Anesthesia Post Note  Patient: Tracy [name]  Procedure(s) Performed: EXCISION, MASS, RIGHT AXILLARY (Right: Axilla)     Patient location during evaluation: PACU Anesthesia Type: General Level of consciousness: awake Pain management: pain level controlled Vital Signs Assessment: post-procedure vital signs reviewed and stable Respiratory status: spontaneous breathing, nonlabored ventilation and respiratory function stable Cardiovascular status: blood pressure returned to baseline and stable Postop Assessment: no apparent nausea or vomiting Anesthetic complications: no   No notable events documented.  Last Vitals:  Vitals:   12/02/23 0815 12/02/23 0830  BP: 118/60 (!) 115/56  Pulse: 75 68  Resp:  13  Temp: (!) 36.2 C   SpO2: 97% 99%    Last Pain:  Vitals:   12/02/23 0844  TempSrc:   PainSc: 4                  [name]

## 2023-12-02 NOTE — Transfer of Care (Signed)
 Immediate Anesthesia Transfer of Care Note  Patient: Tracy [name]  Procedure(s) Performed: EXCISION, MASS, RIGHT AXILLARY (Right: Axilla)  Patient Location: PACU  Anesthesia Type:General  Level of Consciousness: awake, alert , and oriented  Airway & Oxygen Therapy: Patient Spontanous Breathing and Patient connected to nasal cannula oxygen  Post-op Assessment: Report given to RN  Post vital signs: Reviewed and stable  Last Vitals:  Vitals Value Taken Time  BP 118/60 12/02/23 08:16  Temp 36.2 C 12/02/23 08:15  Pulse 74 12/02/23 08:18  Resp 15 12/02/23 08:18  SpO2 98 % 12/02/23 08:18  Vitals shown include unfiled device data.  Last Pain:  Vitals:   12/02/23 9367  TempSrc: Tympanic  PainSc: 4       Patients Stated Pain Goal: 6 (12/02/23 9367)  Complications: No notable events documented.

## 2023-12-02 NOTE — Telephone Encounter (Signed)
 What else needs done for further refills?  Patient has been on the same dose for a while and it is the only dose that works for her

## 2023-12-02 NOTE — Interval H&P Note (Signed)
 History and Physical Interval Note:  12/02/2023 7:17 AM  Tracy [name]  has presented today for surgery, with the diagnosis of MASS RIGHT AXILLA.  The various methods of treatment have been discussed with the patient and family. After consideration of risks, benefits and other options for treatment, the patient has consented to  Procedure(s) with comments: EXCISION, MASS, UPPER EXTREMITY (Right) - EXCISION RIGHT AXILLARY MASS as a surgical intervention.  The patient's history has been reviewed, patient examined, no change in status, stable for surgery.  I have reviewed the patient's chart and labs.  Questions were answered to the patient's satisfaction.   The procedure has been discussed with the patient.  Alternative therapies have been discussed with the patient.  Operative risks include bleeding,  Infection,  Organ injury,  Nerve injury,  Blood vessel injury,  DVT,  Pulmonary embolism,  Death,  And possible reoperation.  Medical management risks include worsening of present situation.  The success of the procedure is 50 -90 % at treating patients symptoms.  The patient understands and agrees to proceed.   Kamika Goodloe A Maygen Sirico

## 2023-12-03 ENCOUNTER — Encounter (HOSPITAL_BASED_OUTPATIENT_CLINIC_OR_DEPARTMENT_OTHER): Payer: Self-pay | Admitting: Surgery

## 2023-12-03 LAB — SURGICAL PATHOLOGY

## 2023-12-03 NOTE — Telephone Encounter (Signed)
 Per Dr. Neda Patient has been on the same dose for a while and it is the only dose that works for her

## 2023-12-06 NOTE — Telephone Encounter (Signed)
 Pharmacy Patient Advocate Encounter   Received notification from Fax that prior authorization for Zolpidem  Tartrate ER 12.5MG  er tablets   is required/requested.   Insurance verification completed.   The patient is insured through [facility] .   Per test claim: PA required; PA started via CoverMyMeds. KEY [id number] . Waiting for clinical questions to populate.

## 2023-12-09 ENCOUNTER — Other Ambulatory Visit (HOSPITAL_COMMUNITY): Payer: Self-pay

## 2023-12-09 NOTE — Telephone Encounter (Signed)
 We're pleased to let you know that we've approved your or your doctor's request for coverage for Zolpidem  Tartrate ER 12.5MG  OR TBCR. You can now fill your prescription, and it will be covered according to your plan. As long as you remain covered by your prescription drug plan and there are no changes to your plan benefits, this request is approved from 12/09/2023 to 12/08/2024. When this approval expires, please speak to your doctor about your treatment.

## 2023-12-09 NOTE — Telephone Encounter (Signed)
Additional questions received and submitted to plan.  Pending determination

## 2023-12-27 NOTE — Progress Notes (Unsigned)
 Patient Care Team: Johnie Perkins, PA-C as PCP - General (Family Medicine) Lanny Callander, MD as Consulting Physician (Hematology) Izell Domino, MD as Attending Physician (Radiation Oncology) Vanderbilt Ned, MD as Consulting Physician (General Surgery)  Clinic Day:  12/28/2023  Referring physician: Johnie Perkins, PA-C  I connected with Olam LITTIE Hacker on 12/28/23 at  9:00 AM EDT by telephone and verified that I am speaking with the correct person using two identifiers.   I discussed the limitations, risks, security and privacy concerns of performing an evaluation and management service by telemedicine and the availability of in-person appointments. I also discussed with the patient that there may be a patient responsible charge related to this service. The patient expressed understanding and agreed to proceed.   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  -stopped tamoxifen  due to elevated liver enzymes. Did try to restart however, LFTs went back up.  -Trial exemestane  25 mg daily starting 08/13/2023. -Continue to follow-up with GI. - Patient experienced significant weight gain on exemestane , approximately 25 pounds in 2 months.  Discontinue exemestane  due to side effects. -Trial anastrozole  1 mg daily starting  09/24/2023. -Removal of right axillary mass in September 2025.  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 -As she was healing, she fell, damaging the wound. She developed a hematoma and infection. Area was drained by the surgeon and she is currently on antibiotics. Will need to schedule mammogram after this wound heals.  -will see her in office in 2 months to check labs  and follow up. Mammogram to be ordered at that time.  -Continue breast cancer surveillance with labs and follow-up in 6 months, sooner if needed.   Right axillary mass Patient recently had benign mass removed from the right axillary region. As she was healing, she fell, damaging the wound. She developed a hematoma and infection. Area was drained by the surgeon and she is currently on antibiotics. Feels very tired as a result. This is the reason for changing in office visit to telehealth.   ER + breast cancer  Switched from exemestane  to anastrozole  daily on 09/24/2023. She is managing this medication much better. She has manageable hot flashes and night sweats. Weight has remained stable. She tolerates the medication well and will continue to take daily. She recently started on Zepbound  to help with weight concerns. This will be managed through her primary care provider.   Plan Will have patient schedule in-office visit with labs in 6 weeks to 2 months.  -perform clinical breast exam -order mammogram if axillary wound is completely healed.   The patient understands the plans discussed today and is in agreement with them.  She knows to contact our office if she develops concerns prior to her next appointment.  I provided 10 minutes of face-to-face time during this encounter and > 50% was spent counseling as documented under my assessment and plan.    Powell FORBES Lessen, NP  [facility] - A DEPT OF [facility] [address] [place] [address] Dept: [phone] Dept Fax: [phone]   No orders of the defined types were placed in this encounter.  CHIEF COMPLAINT:  CC: Left breast cancer, ER +  Current Treatment: Exemestane  (started 08/13/2023) changed to anastrozole  (09/24/2023)  INTERVAL HISTORY:  Quamesha is here today for repeat clinical assessment.  She was last seen by me on 09/24/2023.  I referred her back to  surgery due to development of right axillary soft tissue mass.  She had this removed in September 2025.  Biopsy of the mass showed benign adipose tissue, consistent with lipoma. She did fall while healing from the excision, damaging the wound. She developed a hematoma and infection. Area was drained by the surgeon and she is currently on antibiotics. Feels very tired as a result. This is the reason for changing in office visit to telehealth.  She was changed from exemestane  to anastrozole  in 09/2023. She is managing this medication much better. She does have manageable hot flashes and night sweats. Denies joint or muscle pain which is unusual. She is due for mammogram, however, waiting to heal from axillary wound before she schedules this imaging. She denies fevers or chills. She denies pain. Her appetite is good. Her weight has been stable.  I have reviewed the past medical history, past surgical history, social history and family history with the patient and they are unchanged from previous note.  ALLERGIES:  is allergic to ibuprofen , morphine , doxepin, nsaids, tramadol, and trazodone.  MEDICATIONS:  Current Outpatient Medications  Medication Sig Dispense Refill   AJOVY 225 MG/1.5ML SOAJ ADMINISTER 1.5 ML UNDER THE SKIN MONTHLY     albuterol  (PROVENTIL ) (2.5 MG/3ML) 0.083% nebulizer solution Take 2.5 mg by nebulization every 6 (six) hours as needed for wheezing or shortness of breath.     albuterol  (VENTOLIN  HFA) 108 (90 Base) MCG/ACT inhaler Inhale 2 puffs into the lungs every 6 (six) hours as needed for wheezing or shortness of breath.     ALPRAZolam  (XANAX ) 0.5 MG tablet Take 0.5 tablets by mouth 2 (two) times daily as needed for anxiety.  0   anastrozole  (ARIMIDEX ) 1 MG tablet Take 1 tablet (1 mg total) by mouth daily. 30 tablet 2   azelastine (ASTELIN) 0.1 % nasal spray Place 1 spray into both nostrils daily as needed for allergies or rhinitis.  12   cetirizine (ZYRTEC) 10 MG tablet Take 10 mg  by mouth daily as needed (allergies).     Cyanocobalamin  1000 MCG/ML KIT Inject 1,000 mcg as directed every 30 (thirty) days.     desvenlafaxine (PRISTIQ) 50 MG 24 hr tablet Take 50 mg by mouth daily.     gabapentin  (NEURONTIN ) 100 MG capsule Take 1 capsule (100 mg total) by mouth at bedtime. OK to increase dose to 3 capsules at night in 2-3 weeks if needed for hot flushes 90 capsule 1   hydrochlorothiazide (MICROZIDE) 12.5 MG capsule Take 12.5 mg by mouth daily.     lamoTRIgine  (LAMICTAL ) 100 MG tablet Take 200 mg by mouth 2 (two) times daily.     Lisdexamfetamine Dimesylate (VYVANSE PO) Take by mouth.     methocarbamol (ROBAXIN) 500 MG tablet Take 500 mg by mouth.     oxyCODONE  (OXY IR/ROXICODONE ) 5 MG immediate release tablet Take 5 mg by mouth.     oxyCODONE  (OXY IR/ROXICODONE ) 5 MG immediate release tablet Take 1 tablet (5 mg total) by mouth every 6 (six) hours as needed for severe pain (pain score 7-10). 15 tablet 0   Tirzepatide -Weight Management (ZEPBOUND  Katherine) Inject into the skin.     topiramate  (TOPAMAX ) 50 MG tablet Take 1 tablet (50 mg  total) by mouth 3 (three) times daily. 90 tablet 0   valACYclovir (VALTREX) 1000 MG tablet Take 1,000 mg by mouth daily as needed (cold sores).      Vitamin D , Ergocalciferol , (DRISDOL ) 1.25 MG (50000 UNIT) CAPS capsule Take 1 capsule (50,000 Units total) by mouth every 7 (seven) days. 4 capsule 0   zolpidem  (AMBIEN  CR) 12.5 MG CR tablet Take 1 tablet (12.5 mg total) by mouth at bedtime. 90 tablet 1   No current facility-administered medications for this visit.    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) Staging form: Breast, AJCC 8th Edition - Clinical stage from 02/12/2021: Stage IA (cT1b, cN0, cM0, G2, ER+, PR+, HER2-) - Signed by Izell Domino, MD on 02/12/2021 Stage prefix: Initial diagnosis Histologic grading system: 3 grade system - Pathologic stage  from 02/25/2021: Stage IA (pT1b, pN0, cM0, G2, ER+, PR+, HER2-, Oncotype DX score: 3) - Signed by Lanny Callander, MD on 05/03/2021 Stage prefix: Initial diagnosis Multigene prognostic tests performed: Oncotype DX Recurrence score range: Less than 11 Histologic grading system: 3 grade system Residual tumor (R): R0 - None     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 Suspicious left breast mass corresponding with the screening mammographic findings. 2. No suspicious left axillary lymphadenopathy.   RECOMMENDATION: Ultrasound-guided biopsy of the left breast.   01/23/2021 Procedure   ULTRASOUND GUIDED LEFT BREAST CORE NEEDLE BIOPSY   IMPRESSION: Ultrasound guided biopsy of a mass in the left breast at 11 o'clock. No apparent complications.   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 Based on the biopsy, the carcinoma appears Nottingham grade 2 of 3 and measures 0.4 cm in greatest linear extent.  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 BREAST, LEFT ADDITIONAL ANTERIOSUPERIOR MARGIN, EXCISION:  - Benign breast parenchyma, negative for carcinoma   C. BREAST, LEFT ADDITIONAL MEDIOPOSTERIOR MARGIN, EXCISION:  - Benign breast parenchyma, negative for carcinoma   D. BREAST, LEFT ADDITIONAL INFERIOLATERAL MARGIN, EXCISION:  - Benign breast parenchyma, negative for carcinoma   E. LYMPH NODE, LEFT AXILLARY, SENTINEL, EXCISION:  - Lymph node, negative for carcinoma (0/1)   F. LYMPH NODE, LEFT AXILLARY, SENTINEL, EXCISION:  - Lymph node, negative for carcinoma (0/1)   G. LYMPH NODE, LEFT AXILLARY, SENTINEL,  EXCISION:  - Lymph node, negative for carcinoma (0/1)    02/25/2021 Oncotype testing   Recurrence score of 4, predicts a risk of recurrence outside the breast over the next 9 years of 3%    02/25/2021 Cancer Staging   Staging form: Breast, AJCC 8th Edition - Pathologic stage from 02/25/2021: Stage IA (pT1b, pN0, cM0, G2, ER+, PR+, HER2-, Oncotype DX score: 3) - Signed by Lanny Callander, MD on 05/03/2021 Stage prefix: Initial diagnosis Multigene prognostic tests performed: Oncotype DX Recurrence score range: Less than 11 Histologic grading system: 3 grade system Residual tumor (R): R0 - None   04/03/2021 - 05/05/2021 Radiation Therapy   Site Technique Total Dose (Gy) Dose per Fx (Gy) Completed Fx Beam Energies  Breast, Left: Breast_L 3D 40.05/40.05 2.67 15/15 10X, 15X  Breast, Left: Breast_L_Bst 3D 10/10 2 5/5 6X, 10X     05/2021 -  Anti-estrogen oral therapy   Tamoxifen  daily       REVIEW OF SYSTEMS:   Constitutional: Denies fevers, chills or abnormal weight loss Eyes: Denies blurriness of vision Ears, nose, mouth,  throat, and face: Denies mucositis or sore throat Respiratory: Denies cough, dyspnea or wheezes Cardiovascular: Denies palpitation, chest discomfort or lower extremity swelling Gastrointestinal:  Denies nausea, heartburn or change in bowel habits Skin: Denies abnormal skin rashes Lymphatics: Denies new lymphadenopathy or easy bruising Neurological:Denies numbness, tingling or new weaknesses Behavioral/Psych: Mood is stable, no new changes  All other systems were reviewed with the patient and are negative.   VITALS:  Last menstrual period 07/20/2008.  Wt Readings from Last 3 Encounters:  12/02/23 276 lb 10.8 oz (125.5 kg)  09/24/23 270 lb 8 oz (122.7 kg)  08/11/23 254 lb 9.6 oz (115.5 kg)    There is no height or weight on file to calculate BMI.  Performance status (ECOG): 1 - Symptomatic but completely ambulatory  PHYSICAL EXAM:   GENERAL:alert, no distress and comfortable. Fatigue due to infection.  SKIN: skin color, texture, turgor are normal, no rashes or significant lesions EYES: normal, Conjunctiva are pink and non-injected, sclera clear OROPHARYNX:no exudate, no erythema and lips, buccal mucosa, and tongue normal  NECK: supple, thyroid  normal size, non-tender, without nodularity LYMPH:  no palpable lymphadenopathy in the cervical, axillary or inguinal LUNGS: clear to auscultation and percussion with normal breathing effort HEART: regular rate & rhythm and no murmurs and no lower extremity edema ABDOMEN:abdomen soft, non-tender and normal bowel sounds Musculoskeletal:no cyanosis of digits and no clubbing  NEURO: alert & oriented x 3 with fluent speech, no focal motor/sensory deficits  LABORATORY DATA:  I have reviewed the data as listed    Component Value Date/Time   NA 138 11/30/2023 1300   NA 145 (H) 04/29/2023 1146   NA 141 09/26/2013 1414   K 3.9 11/30/2023 1300   K 3.2 (L) 09/26/2013 1414   CL 96 (L) 11/30/2023 1300   CL 102 09/26/2013 1414   CO2 29 11/30/2023  1300   CO2 31 09/26/2013 1414   GLUCOSE 86 11/30/2023 1300   GLUCOSE 85 09/26/2013 1414   BUN 10 11/30/2023 1300   BUN 12 04/29/2023 1146   BUN 10 09/26/2013 1414   CREATININE 0.79 11/30/2023 1300   CREATININE 0.68 09/24/2023 1029   CREATININE 0.71 09/26/2013 1414   CALCIUM 9.3 11/30/2023 1300   CALCIUM 8.9 09/26/2013 1414   PROT 7.0 09/24/2023 1029   PROT 6.4 04/29/2023 1146   PROT 7.5 09/26/2013 1414   ALBUMIN 3.8 09/24/2023 1029  ALBUMIN 4.0 04/29/2023 1146   ALBUMIN 3.5 09/26/2013 1414   AST 25 09/24/2023 1029   ALT 38 09/24/2023 1029   ALT 27 09/26/2013 1414   ALKPHOS 154 (H) 09/24/2023 1029   ALKPHOS 72 09/26/2013 1414   BILITOT 0.2 09/24/2023 1029   GFRNONAA >60 11/30/2023 1300   GFRNONAA >60 09/24/2023 1029   GFRNONAA >60 09/26/2013 1414   GFRAA >60 05/06/2017 1117   GFRAA >60 09/26/2013 1414    Lab Results  Component Value Date   WBC 5.8 09/24/2023   NEUTROABS 2.9 09/24/2023   HGB 13.4 09/24/2023   HCT 41.5 09/24/2023   MCV 87.2 09/24/2023   PLT 275 09/24/2023

## 2023-12-27 NOTE — Assessment & Plan Note (Signed)
 No evidence of malignancy on last mammogram (01/18/2023).  -stopped tamoxifen  due to elevated liver enzymes. Did try to restart however, LFTs went back up.  -Trial exemestane  25 mg daily starting 08/13/2023. -Continue to follow-up with GI. - Patient experienced significant weight gain on exemestane , approximately 25 pounds in 2 months.  Discontinue exemestane  due to side effects. -Trial anastrozole  1 mg daily starting  09/24/2023. -Removal of right axillary mass in September 2025.  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 -Continue breast cancer surveillance with labs and follow-up in 6 months, sooner if needed.

## 2023-12-28 ENCOUNTER — Inpatient Hospital Stay: Attending: Adult Health | Admitting: Nurse Practitioner

## 2023-12-28 ENCOUNTER — Encounter: Payer: Self-pay | Admitting: Nurse Practitioner

## 2023-12-28 DIAGNOSIS — Z170 Estrogen receptor positive status [ER+]: Secondary | ICD-10-CM | POA: Diagnosis not present

## 2023-12-28 DIAGNOSIS — C50212 Malignant neoplasm of upper-inner quadrant of left female breast: Secondary | ICD-10-CM | POA: Diagnosis not present

## 2023-12-29 ENCOUNTER — Telehealth: Payer: Self-pay | Admitting: Nurse Practitioner

## 2023-12-29 NOTE — Telephone Encounter (Signed)
 Jeffery has been contacted and made aware of her appts scheduled in 2 months.

## 2024-01-19 ENCOUNTER — Encounter

## 2024-02-08 ENCOUNTER — Ambulatory Visit
Admission: RE | Admit: 2024-02-08 | Discharge: 2024-02-08 | Disposition: A | Discharge status: Home / Self Care | Source: Ambulatory Visit | Attending: Nurse Practitioner | Admitting: Nurse Practitioner

## 2024-02-08 DIAGNOSIS — C50212 Malignant neoplasm of upper-inner quadrant of left female breast: Secondary | ICD-10-CM

## 2024-02-22 ENCOUNTER — Ambulatory Visit

## 2024-02-24 ENCOUNTER — Ambulatory Visit

## 2024-02-28 NOTE — Progress Notes (Deleted)
 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  -stopped tamoxifen  due to elevated liver enzymes. Did try to restart however, LFTs went back up.  -Trial exemestane  25 mg daily starting 08/13/2023. -Continue to follow-up with GI. - Patient experienced significant weight gain on exemestane , approximately 25 pounds in 2 months.  Discontinue exemestane  due to side effects. -Trial anastrozole  1 mg daily starting  09/24/2023. -Removal of right axillary mass in September 2025.  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 -As she was healing, she fell, damaging the wound. She developed a hematoma and infection. Area was drained by the surgeon and she is currently on antibiotics. Will need to schedule mammogram after this wound heals.  -will see her in office in 2 months to check labs and follow up. Mammogram to be ordered at that time.  -Continue breast cancer surveillance with labs and follow-up in 6 months, sooner if needed.   The patient understands the plans discussed today and is in agreement with them.  She knows to contact our office if she develops concerns prior to her next appointment.  I provided *** minutes of face-to-face time during this encounter and > 50% was spent counseling as documented under my assessment and plan.    Powell FORBES Lessen, NP  [facility] - A DEPT OF [facility] [address] [place] [address] Dept: [phone] Dept Fax: [phone]   No orders of the defined  types were placed in this encounter.     CHIEF COMPLAINT:  CC: Left breast cancer, ER +  Current Treatment: Anastrozole  daily (started 09/24/2023; initially started on exemestane  08/13/2023)  INTERVAL HISTORY:  Tracy [name] is here today for repeat clinical assessment.  We last connected via phone visit on 12/28/2023.  She had bilateral 3D diagnostic mammograms on 02/08/2024.  Results were benign.  Will need to reorder DEXA scan.  She has breast density category B.  She denies fevers or chills. She denies pain. Her appetite is good. Her weight {Weight change:10426}.  I have reviewed the past medical history, past surgical history, social history and family history with the patient and they are unchanged from previous note.  ALLERGIES:  is allergic to ibuprofen , morphine , doxepin, nsaids, tramadol, and trazodone.  MEDICATIONS:  Current Outpatient Medications  Medication Sig Dispense Refill   AJOVY 225 MG/1.5ML SOAJ ADMINISTER 1.5 ML UNDER THE SKIN MONTHLY     albuterol  (PROVENTIL ) (2.5 MG/3ML) 0.083% nebulizer solution Take 2.5 mg by nebulization every 6 (six) hours as needed for wheezing or shortness of breath.     albuterol  (VENTOLIN  HFA) 108 (90 Base) MCG/ACT inhaler Inhale 2 puffs into the lungs every 6 (six) hours as needed for wheezing or shortness of breath.     ALPRAZolam  (XANAX ) 0.5 MG tablet Take 0.5 tablets by mouth 2 (two) times daily as needed for anxiety.  0   anastrozole  (ARIMIDEX ) 1 MG tablet Take 1 tablet (1 mg total) by mouth daily. 30 tablet 2   azelastine (ASTELIN) 0.1 % nasal spray Place 1 spray into both nostrils daily as needed  for allergies or rhinitis.  12   cetirizine (ZYRTEC) 10 MG tablet Take 10 mg by mouth daily as needed (allergies).     Cyanocobalamin  1000 MCG/ML KIT Inject 1,000 mcg as directed every 30 (thirty) days.     desvenlafaxine (PRISTIQ) 50 MG 24 hr tablet Take 50 mg by mouth daily.     gabapentin  (NEURONTIN ) 100 MG capsule Take 1 capsule (100 mg total) by  mouth at bedtime. OK to increase dose to 3 capsules at night in 2-3 weeks if needed for hot flushes 90 capsule 1   hydrochlorothiazide (MICROZIDE) 12.5 MG capsule Take 12.5 mg by mouth daily.     lamoTRIgine  (LAMICTAL ) 100 MG tablet Take 200 mg by mouth 2 (two) times daily.     Lisdexamfetamine Dimesylate (VYVANSE PO) Take by mouth.     methocarbamol (ROBAXIN) 500 MG tablet Take 500 mg by mouth.     oxyCODONE  (OXY IR/ROXICODONE ) 5 MG immediate release tablet Take 5 mg by mouth.     oxyCODONE  (OXY IR/ROXICODONE ) 5 MG immediate release tablet Take 1 tablet (5 mg total) by mouth every 6 (six) hours as needed for severe pain (pain score 7-10). 15 tablet 0   Tirzepatide -Weight Management (ZEPBOUND  ) Inject into the skin.     topiramate  (TOPAMAX ) 50 MG tablet Take 1 tablet (50 mg total) by mouth 3 (three) times daily. 90 tablet 0   valACYclovir (VALTREX) 1000 MG tablet Take 1,000 mg by mouth daily as needed (cold sores).      Vitamin D , Ergocalciferol , (DRISDOL ) 1.25 MG (50000 UNIT) CAPS capsule Take 1 capsule (50,000 Units total) by mouth every 7 (seven) days. 4 capsule 0   zolpidem  (AMBIEN  CR) 12.5 MG CR tablet Take 1 tablet (12.5 mg total) by mouth at bedtime. 90 tablet 1   No current facility-administered medications for this visit.    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) Staging form: Breast, AJCC 8th Edition - Clinical stage from 02/12/2021: Stage IA (cT1b, cN0, cM0, G2, ER+, PR+, HER2-) - Signed by Izell Domino, MD on 02/12/2021 Stage prefix: Initial diagnosis Histologic grading system: 3 grade system - Pathologic stage from 02/25/2021: Stage IA (pT1b, pN0, cM0, G2, ER+, PR+, HER2-, Oncotype DX score: 3) - Signed by Lanny Callander, MD on 05/03/2021 Stage prefix: Initial diagnosis Multigene prognostic tests performed: Oncotype DX Recurrence score range: Less than 11 Histologic grading system: 3  grade system Residual tumor (R): R0 - None     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 Suspicious left breast mass corresponding with the screening mammographic findings. 2. No suspicious left axillary lymphadenopathy.   RECOMMENDATION: Ultrasound-guided biopsy of the left breast.   01/23/2021 Procedure   ULTRASOUND GUIDED LEFT BREAST CORE NEEDLE BIOPSY   IMPRESSION: Ultrasound guided biopsy of a mass in the left breast at 11 o'clock. No apparent complications.   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 Based on the biopsy, the carcinoma appears Nottingham grade 2 of 3 and measures 0.4 cm in greatest linear extent.  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 BREAST, LEFT, LUMPECTOMY:  - Invasive ductal carcinoma, 0.9 cm, grade 2  - Ductal carcinoma in situ, low to intermediate grade  - Biopsy site change  - See  oncology table   B. BREAST, LEFT ADDITIONAL ANTERIOSUPERIOR MARGIN, EXCISION:  - Benign breast parenchyma, negative for carcinoma   C. BREAST, LEFT ADDITIONAL MEDIOPOSTERIOR MARGIN, EXCISION:  - Benign breast parenchyma, negative for carcinoma   D. BREAST, LEFT ADDITIONAL INFERIOLATERAL MARGIN, EXCISION:  - Benign breast parenchyma, negative for carcinoma   E. LYMPH NODE, LEFT AXILLARY, SENTINEL, EXCISION:  - Lymph node, negative for carcinoma (0/1)   F. LYMPH NODE, LEFT AXILLARY, SENTINEL, EXCISION:  - Lymph node, negative for carcinoma (0/1)   G. LYMPH NODE, LEFT AXILLARY, SENTINEL, EXCISION:  - Lymph node, negative for carcinoma (0/1)    02/25/2021 Oncotype testing   Recurrence score of 4, predicts a risk of recurrence outside the breast over the next 9 years of 3%    02/25/2021 Cancer Staging   Staging form: Breast, AJCC 8th Edition - Pathologic stage from 02/25/2021: Stage IA (pT1b, pN0, cM0, G2, ER+, PR+, HER2-, Oncotype DX score: 3) - Signed by Lanny Callander, MD on 05/03/2021 Stage prefix: Initial diagnosis Multigene prognostic tests performed: Oncotype DX Recurrence score range: Less than 11 Histologic grading system: 3 grade system Residual tumor (R): R0 - None   04/03/2021 - 05/05/2021 Radiation Therapy   Site Technique Total Dose (Gy) Dose per Fx (Gy) Completed Fx Beam Energies  Breast, Left: Breast_L 3D 40.05/40.05 2.67 15/15 10X, 15X  Breast, Left: Breast_L_Bst 3D 10/10 2 5/5 6X, 10X     05/2021 -  Anti-estrogen oral therapy   Tamoxifen  daily       REVIEW OF SYSTEMS:   Constitutional: Denies fevers, chills or abnormal weight loss Eyes: Denies blurriness of vision Ears, nose, mouth, throat, and face: Denies mucositis or sore throat Respiratory: Denies cough, dyspnea or wheezes Cardiovascular: Denies palpitation, chest discomfort or lower extremity swelling Gastrointestinal:  Denies nausea, heartburn or change in bowel habits Skin: Denies abnormal skin  rashes Lymphatics: Denies new lymphadenopathy or easy bruising Neurological:Denies numbness, tingling or new weaknesses Behavioral/Psych: Mood is stable, no new changes  All other systems were reviewed with the patient and are negative.   VITALS:  Last menstrual period 07/20/2008.  Wt Readings from Last 3 Encounters:  12/02/23 276 lb 10.8 oz (125.5 kg)  09/24/23 270 lb 8 oz (122.7 kg)  08/11/23 254 lb 9.6 oz (115.5 kg)    There is no height or weight on file to calculate BMI.  Performance status (ECOG): {CHL ONC [id number]  PHYSICAL EXAM:   GENERAL:alert, no distress and comfortable SKIN: skin color, texture, turgor are normal, no rashes or significant lesions EYES: normal, Conjunctiva are pink and non-injected, sclera clear OROPHARYNX:no exudate, no erythema and lips, buccal mucosa, and tongue normal  NECK: supple, thyroid  normal size, non-tender, without nodularity LYMPH:  no  palpable lymphadenopathy in the cervical, axillary or inguinal LUNGS: clear to auscultation and percussion with normal breathing effort HEART: regular rate & rhythm and no murmurs and no lower extremity edema ABDOMEN:abdomen soft, non-tender and normal bowel sounds Musculoskeletal:no cyanosis of digits and no clubbing  NEURO: alert & oriented x 3 with fluent speech, no focal motor/sensory deficits  LABORATORY DATA:  I have reviewed the data as listed    Component Value Date/Time   NA 138 11/30/2023 1300   NA 145 (H) 04/29/2023 1146   NA 141 09/26/2013 1414   K 3.9 11/30/2023 1300   K 3.2 (L) 09/26/2013 1414   CL 96 (L) 11/30/2023 1300   CL 102 09/26/2013 1414   CO2 29 11/30/2023 1300   CO2 31 09/26/2013 1414   GLUCOSE 86 11/30/2023 1300   GLUCOSE 85 09/26/2013 1414   BUN 10 11/30/2023 1300   BUN 12 04/29/2023 1146   BUN 10 09/26/2013 1414   CREATININE 0.79 11/30/2023 1300   CREATININE 0.68 09/24/2023 1029   CREATININE 0.71 09/26/2013 1414   CALCIUM 9.3 11/30/2023 1300   CALCIUM 8.9  09/26/2013 1414   PROT 7.0 09/24/2023 1029   PROT 6.4 04/29/2023 1146   PROT 7.5 09/26/2013 1414   ALBUMIN 3.8 09/24/2023 1029   ALBUMIN 4.0 04/29/2023 1146   ALBUMIN 3.5 09/26/2013 1414   AST 25 09/24/2023 1029   ALT 38 09/24/2023 1029   ALT 27 09/26/2013 1414   ALKPHOS 154 (H) 09/24/2023 1029   ALKPHOS 72 09/26/2013 1414   BILITOT 0.2 09/24/2023 1029   GFRNONAA >60 11/30/2023 1300   GFRNONAA >60 09/24/2023 1029   GFRNONAA >60 09/26/2013 1414   GFRAA >60 05/06/2017 1117   GFRAA >60 09/26/2013 1414    No results found for: SPEP, UPEP  Lab Results  Component Value Date   WBC 5.8 09/24/2023   NEUTROABS 2.9 09/24/2023   HGB 13.4 09/24/2023   HCT 41.5 09/24/2023   MCV 87.2 09/24/2023   PLT 275 09/24/2023      Chemistry      Component Value Date/Time   NA 138 11/30/2023 1300   NA 145 (H) 04/29/2023 1146   NA 141 09/26/2013 1414   K 3.9 11/30/2023 1300   K 3.2 (L) 09/26/2013 1414   CL 96 (L) 11/30/2023 1300   CL 102 09/26/2013 1414   CO2 29 11/30/2023 1300   CO2 31 09/26/2013 1414   BUN 10 11/30/2023 1300   BUN 12 04/29/2023 1146   BUN 10 09/26/2013 1414   CREATININE 0.79 11/30/2023 1300   CREATININE 0.68 09/24/2023 1029   CREATININE 0.71 09/26/2013 1414      Component Value Date/Time   CALCIUM 9.3 11/30/2023 1300   CALCIUM 8.9 09/26/2013 1414   ALKPHOS 154 (H) 09/24/2023 1029   ALKPHOS 72 09/26/2013 1414   AST 25 09/24/2023 1029   ALT 38 09/24/2023 1029   ALT 27 09/26/2013 1414   BILITOT 0.2 09/24/2023 1029       RADIOGRAPHIC STUDIES: I have personally reviewed the radiological images as listed and agreed with the findings in the report. MM 3D DIAGNOSTIC MAMMOGRAM BILATERAL BREAST Result Date: 02/08/2024 CLINICAL DATA:  58 year old female status post left breast lumpectomy December 2022. EXAM: DIGITAL DIAGNOSTIC BILATERAL MAMMOGRAM WITH TOMOSYNTHESIS AND CAD TECHNIQUE: Bilateral digital diagnostic mammography and breast tomosynthesis was performed.  The images were evaluated with computer-aided detection. COMPARISON:  Previous exam(s). ACR Breast Density Category b: There are scattered areas of fibroglandular density. FINDINGS: Right mammogram:  No findings suspicious for malignancy. Left mammogram: Post lumpectomy changes. No findings suspicious for malignancy. IMPRESSION: Sequela of left breast lumpectomy, no mammographic evidence of malignancy. A result letter of this mammogram will be mailed directly to the patient. RECOMMENDATION: As the patient is now over 2 years out from her lumpectomy, she may return to annual screening mammography in 1 year. Given her history of breast cancer, she remains eligible for annual diagnostic mammography, if preferred. I have discussed the findings and recommendations with the patient. If applicable, a reminder letter will be sent to the patient regarding the next appointment. BI-RADS CATEGORY  2: Benign. Electronically Signed   By: Curtistine Noble   On: 02/08/2024 08:52

## 2024-02-28 NOTE — Assessment & Plan Note (Deleted)
° °  No evidence of malignancy on last mammogram (01/18/2023).  -stopped tamoxifen  due to elevated liver enzymes. Did try to restart however, LFTs went back up.  -Trial exemestane  25 mg daily starting 08/13/2023. -Continue to follow-up with GI. - Patient experienced significant weight gain on exemestane , approximately 25 pounds in 2 months.  Discontinue exemestane  due to side effects. -Trial anastrozole  1 mg daily starting  09/24/2023. -Removal of right axillary mass in September 2025.  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 -As she was healing, she fell, damaging the wound. She developed a hematoma and infection. Area was drained by the surgeon and she is currently on antibiotics. Will need to schedule mammogram after this wound heals.  -will see her in office in 2 months to check labs and follow up. Mammogram to be ordered at that time.  -Continue breast cancer surveillance with labs and follow-up in 6 months, sooner if needed.

## 2024-02-29 ENCOUNTER — Ambulatory Visit: Attending: Physical Medicine & Rehabilitation

## 2024-03-01 ENCOUNTER — Inpatient Hospital Stay: Admitting: Nurse Practitioner

## 2024-03-01 ENCOUNTER — Inpatient Hospital Stay: Attending: Adult Health

## 2024-03-07 ENCOUNTER — Ambulatory Visit

## 2024-03-14 ENCOUNTER — Inpatient Hospital Stay

## 2024-03-14 ENCOUNTER — Inpatient Hospital Stay: Admitting: Nurse Practitioner

## 2024-03-15 ENCOUNTER — Ambulatory Visit

## 2024-03-21 ENCOUNTER — Ambulatory Visit

## 2024-03-21 NOTE — Progress Notes (Signed)
 " Chief Complaint  Patient presents with   Sleep Apnea    Request referral for sleep study     Subjective  Tracy [name] is a 59 y.o. female who presents for Sleep Apnea (Request referral for sleep study ) HPI History of Present Illness Tracy [name] is a 59 year old female with sleep apnea who presents with persistent fatigue and chest pressure.  She was diagnosed with mild to moderate sleep apnea in 2016 and was not prescribed a CPAP machine. She experiences persistent fatigue, feeling tired throughout the day despite receiving B12 injections and taking iron  supplements. She previously received iron  infusions and is currently taking iron  pills 3 times a week her last B12 level was 492, and she is awaiting current results before receiving another injection.  She experiences chest pressure and expresses concern about her heart health, noting increased anxiety about her health as she ages. She has a history of gastric bypass surgery and continues to lose weight slowly. She maintains a routine of walking but feels sluggish.  She takes Slow Fe three times a week, as daily intake led to bladder infections. She stopped taking Ambien  due to nightmares and is now using the Calm app and meditation to improve her sleep.  Also has a history of low vitamin D  level but it is unclear if she is taking a supplement or how often as she was confused about this versus iron  supplement.  She has a history of breast cancer and she is due for lab work. She was previously concerned about prediabetes, but her last A1c in 2024 was normal at 5.5.  She uses tretinoin for melasma, which has been effective without causing significant irritation. Last sleep study 03/22/14  showed AHI of 14   Lab Results  Component Value Date   HGBA1C 5.5 05/26/2022   HGBA1C 6.0 07/22/2021   Most recent vitamin B12 value Lab Results  Component Value Date   VITB12 544 05/26/2022   Wt Readings from Last 10 Encounters:  03/21/24 (!) 120.5 kg  (265 lb 10.5 oz)  03/17/24 (!) 103 kg (227 lb)  03/06/24 (!) 103.3 kg (227 lb 11.8 oz)  02/15/24 (!) 103.3 kg (227 lb 12.8 oz)  01/24/24 (!) 125.2 kg (276 lb)  12/31/23 (!) 125.2 kg (276 lb)  12/20/23 (!) 125.5 kg (276 lb 9.6 oz)  11/05/23 (!) 122.2 kg (269 lb 6.4 oz)  10/20/23 (!) 120.7 kg (266 lb)  10/18/23 (!) 122.4 kg (269 lb 13.5 oz)   Most recent 25 OH Vitamin D  value Lab Results  Component Value Date   VITD 24 (L) 05/26/2022   Last Lipids: Lab Results  Component Value Date   CHOLTOTAL 231 11/25/2020   LDLCALC 93 11/25/2020   HDL 115 11/25/2020   TRIG 884 11/25/2020     Review of Systems  Patient Active Problem List  Diagnosis   PUD (peptic ulcer disease) - has seen Dr. Charlean   Asthma (HHS-HCC)   Anxiety and depression   Iron  deficiency anemia due to chronic blood loss: sees [facility] 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 (CMS/HHS-HCC)   Pinched vertebral nerve   ADD (attention deficit disorder) without hyperactivity   Atrophic vaginitis   Cervicalgia   Closed nondisplaced fracture of sixth cervical vertebra with routine healing   Insulin  resistance   Lactose intolerance   Menopausal syndrome   Other constipation   Other fatigue   Polyphagia   Right lower quadrant pain   S/P gastric bypass   Vitamin D  deficiency   SOBOE (shortness of breath on exertion)    Outpatient Medications Prior to Visit  Medication Sig Dispense Refill   albuterol  MDI, PROVENTIL , VENTOLIN , PROAIR , HFA 90  mcg/actuation inhaler Inhale 2 inhalations into the lungs every 4 (four) hours as needed for Wheezing 6.7 g 2   ALPRAZolam  (XANAX ) 0.5 MG tablet TAKE 1 TO 2 TABLETS BY MOUTH DAILY AS NEEDED FOR SEVERE ANXIETY     anastrozole  (ARIMIDEX ) 1 mg tablet Take 1 mg by mouth once daily     budesonide-formoteroL (SYMBICORT) 80-4.5 mcg/actuation inhaler Inhale 2 inhalations into the lungs 2 (two) times daily And use every 4 hours as needed for rescue inhaler 6.9 g 1   celecoxib (CELEBREX) 100 MG capsule TAKE 1 CAPSULE(100 MG) BY MOUTH DAILY 30 capsule 0   cetirizine (ZYRTEC) 10 mg capsule Take 10 mg by mouth once daily Reported on 07/29/2015       cyanocobalamin  (VITAMIN B12) 1,000 mcg/mL injection      desvenlafaxine succinate (PRISTIQ) 100 MG ER tablet Take 100 mg by mouth once daily     fremanezumab-vfrm 225 mg/1.5 mL AtIn Inject 225 mg subcutaneously monthly 1.5 mL 11   gabapentin  (NEURONTIN ) 100 MG capsule 1 po qHS 60 capsule 0   hydroCHLOROthiazide (HYDRODIURIL) 12.5 MG tablet TAKE 1 TABLET(12.5 MG) BY MOUTH EVERY DAY 100 tablet 1   hydroquinone 4 % cream Apply topically at bedtime 28 g 0   lamoTRIgine  (LAMICTAL ) 100 MG tablet Take 100 mg by mouth every 12 (twelve) hours     lisdexamfetamine (VYVANSE) 50 MG capsule Take 50 mg by mouth once daily     lurasidone (LATUDA) 60 mg tablet Take 60 mg by mouth once daily     methocarbamoL (ROBAXIN) 500 MG tablet Take 1 tablet (500 mg total) by mouth 4 (four) times daily 40 tablet 2   oxyCODONE  (ROXICODONE ) 5 MG immediate release tablet Take 1 tablet (5 mg total) by mouth every 4 (four) hours as needed for Pain for up to 40 doses 40 tablet 0   PREVIDENT 5000 SENSITIVE 1.1-5 % use as directed by prescriber  0   topiramate  (TOPAMAX ) 50 MG tablet TAKE 1 TABLET(50 MG) BY MOUTH THREE TIMES DAILY 300 tablet 3   traMADoL (ULTRAM) 50 mg tablet Take 1 tablet (50 mg total) by mouth every 6 (six) hours as needed for Pain for up to 30 doses 30 tablet 0    valACYclovir (VALTREX) 1000 MG tablet TAKE 2 TABLETS(2000 MG) BY MOUTH TWICE DAILY 60 tablet 11   zolpidem  (AMBIEN  CR) 12.5 MG CR tablet Take 12.5 mg by mouth at bedtime     tretinoin (RETIN-A) 0.05 % cream Apply topically at bedtime 45 g 0   doxycycline (MONODOX) 100 MG capsule Take 1 capsule (100 mg total) by mouth 2 (two) times daily for 5 days (Patient not taking: Reported on 02/15/2024) 10 capsule 0   Facility-Administered Medications Prior to Visit  Medication Dose Route Frequency Provider Last Rate Last  Admin   cyanocobalamin  (VITAMIN B12) injection 1,000 mcg  1,000 mcg Intramuscular Q30 Days Jose-Mathews, Jessnie, MD   1,000 mcg at 12/31/23 1135      Objective  Vitals:   03/21/24 1046  BP: 136/79  Pulse: 74  Weight: (!) 120.5 kg (265 lb 10.5 oz)  PainSc: 0-No pain   Body mass index is 44.21 kg/m.  Home Vitals:     Physical Exam Physical Exam   Constitutional: alert, in NAD, and communicates well Eye exam: pupils equal and reactive, extraocular eye movements intact. Neck: supple, no thyroid  enlargement or cervical adenopathy, and no bruits heard Respiratory: clear to auscultation, without rales or wheezes  Cardiovascular: regular rate and rhythm and without murmurs, rubs or gallops Lower extremities: no lower extremity edema  Results Labs Ferritin (09/24/2023): Low Vitamin B12 (09/24/2023): 492 Hemoglobin A1c (2024): 5.5 Cholesterol (04/2023): Within normal limits     Assessment/Plan:   Assessment & Plan Sleep-related disorder evaluation (possible obstructive sleep apnea and insomnia) Mild to moderate obstructive sleep apnea diagnosed in 2016. Reports persistent fatigue and chest pressure. Insomnia managed with lifestyle modifications, including meditation and the Calm app. Home sleep study recommended due to difficulty scheduling in-lab studies and potential insomnia. - Ordered home sleep study through [facility] in [place] . - If home study  confirms sleep apnea, will schedule CPAP study.  Iron  deficiency anemia Previous low ferritin levels. Currently taking iron  supplements every other day. No recent anemia, but iron  deficiency persists. - Rechecked ferritin levels at oncologist's office. - Continue iron  supplementation every other day.  Vitamin B12 deficiency Previous B12 level was 492, which is adequate. Injection held off before blood draw to avoid skewed results. - Rechecked B12 levels today. - Hold off on B12 injection until after blood draw.  Vitamin D  deficiency Recheck level today.  Unclear how often she is taking supplement or if at all.  Melasma Managed with tretinoin, which is effective with minimal irritation. Peeling noted but not severe. - Continue tretinoin for melasma management.  Essential hypertension Blood pressure is well-controlled.  Mild intermittent asthma Asthma is well-controlled.  Prediabetes Previous A1c was normal at 5.5 in 2024. - Rechecked A1c today.  Status post gastric bypass Increased risk for vitamin and mineral deficiencies due to malabsorption. Currently taking B12 injections and iron  supplements. - Continue monitoring vitamin and mineral levels.  History of breast cancer Breast cancer is currently stable with no active issues. - Continue regular follow-up with oncologist. Diagnoses and all orders for this visit:  Essential hypertension -     Complete Blood Count (CBC) with Differential; Future -     Comprehensive Metabolic Panel (CMP); Future  Melasma -     tretinoin (RETIN-A) 0.05 % cream; Apply topically at bedtime  Mild intermittent asthma without complication (HHS-HCC)  Class 3 severe obesity due to excess calories with serious comorbidity and body mass index (BMI) of 40.0 to 44.9 in adult (CMS-HCC)  Prediabetes -     Hemoglobin A1C; Future  S/P gastric bypass -     25 OH Vitamin D ; Future  Iron  deficiency anemia due to chronic blood loss: sees Dr. KATHEE at  [facility] in [place] -     Ferritin; Future  Primary insomnia -     Thyroid  Stimulating Hormone (TSH); Future -     EXTERNAL Ambulatory Referral to Sleep Study: For referral LEAVING the [facility] ONLY. Internal referrals should use the orderables listed above.  Excessive daytime sleepiness -     EXTERNAL Ambulatory  Referral to Sleep Study: For referral LEAVING the [facility] ONLY. Internal referrals should use the orderables listed above.  Vitamin B12 deficiency -     Vitamin B12; Future  Vitamin D  deficiency -     25 OH Vitamin D ; Future  Carcinoma of upper-inner quadrant of left breast in female, estrogen receptor negative (CMS/HHS-HCC)    This visit was coded based on medical decision making (MDM).           Future Appointments     Date/Time Provider Department Center Visit Type   05/29/2024 10:00 AM (Arrive by 9:45 AM) Johnie Damien Dragon, PA [facility] [place] [facility] PHYSICAL   06/23/2024 10:45 AM Evern Allyson Hacker, NP [facility] C RETURN VISIT       There are no Patient Instructions on file for this visit.  An after visit summary was provided for the patient either in written format (printed) or through [facility].  This note has been created using automated tools and reviewed for accuracy by KAREN JUTTA BEHLING.  "

## 2024-03-22 ENCOUNTER — Ambulatory Visit

## 2024-03-26 NOTE — Progress Notes (Unsigned)
 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 [facility]) Staging form: Breast, AJCC 8th Edition - Clinical stage from 02/12/2021: Stage IA (cT1b, cN0, cM0, G2, ER+, PR+, HER2-) - Signed by Izell Domino, MD on 02/12/2021 Stage prefix: Initial diagnosis Histologic grading system: 3 grade system - Pathologic stage from 02/25/2021: Stage IA (pT1b, pN0, cM0, G2, ER+, PR+, HER2-, Oncotype DX score: 3) - Signed by Lanny Callander, MD on 05/03/2021 Stage prefix: Initial diagnosis Multigene prognostic tests performed: Oncotype DX Recurrence score range: Less than 11 Histologic grading system: 3 grade system Residual tumor (R): R0 - None     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 Suspicious left breast mass corresponding with the screening mammographic findings. 2. No suspicious left axillary lymphadenopathy.   RECOMMENDATION: Ultrasound-guided biopsy of the left breast.   01/23/2021 Procedure   ULTRASOUND GUIDED LEFT BREAST CORE NEEDLE BIOPSY   IMPRESSION: Ultrasound guided biopsy of a mass in the left breast at 11 o'clock. No apparent complications.   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 Based  on the biopsy, the carcinoma appears Nottingham grade 2 of 3 and measures 0.4 cm in greatest linear extent.  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 BREAST, LEFT ADDITIONAL ANTERIOSUPERIOR MARGIN, EXCISION:  - Benign breast parenchyma, negative for carcinoma   C. BREAST, LEFT ADDITIONAL MEDIOPOSTERIOR MARGIN, EXCISION:  - Benign breast parenchyma, negative for carcinoma   D. BREAST, LEFT ADDITIONAL INFERIOLATERAL MARGIN, EXCISION:  - Benign breast parenchyma, negative for carcinoma   E. LYMPH NODE, LEFT AXILLARY, SENTINEL, EXCISION:  - Lymph node, negative for carcinoma (0/1)   F. LYMPH NODE, LEFT AXILLARY, SENTINEL, EXCISION:  - Lymph node, negative for carcinoma (0/1)   G. LYMPH NODE, LEFT AXILLARY, SENTINEL, EXCISION:  - Lymph node, negative for carcinoma (0/1)    02/25/2021 Oncotype testing   Recurrence score of 4, predicts a risk of recurrence outside the breast over the next 9 years of  3%    02/25/2021 Cancer Staging   Staging form: Breast, AJCC 8th Edition - Pathologic stage from 02/25/2021: Stage  IA (pT1b, pN0, cM0, G2, ER+, PR+, HER2-, Oncotype DX score: 3) - Signed by Lanny Callander, MD on 05/03/2021 Stage prefix: Initial diagnosis Multigene prognostic tests performed: Oncotype DX Recurrence score range: Less than 11 Histologic grading system: 3 grade system Residual tumor (R): R0 - None   04/03/2021 - 05/05/2021 Radiation Therapy   Site Technique Total Dose (Gy) Dose per Fx (Gy) Completed Fx Beam Energies  Breast, Left: Breast_L 3D 40.05/40.05 2.67 15/15 10X, 15X  Breast, Left: Breast_L_Bst 3D 10/10 2 5/5 6X, 10X     05/2021 -  Anti-estrogen oral therapy   Tamoxifen  daily      CURRENT THERAPY: Anti-estrogen  INTERVAL HISTORY Ms. Creppel returns for follow up as scheduled. Doing well without changes in her health since last visit. Had been taking tylenol  for neck pain but has stopped, now taking oxycodone . Does not take statin or drink alcohol. Started latuda recently for depression which helps. Notes occasional flutter in RUQ for a few months. Has stable joint pain in her knees that pre-dates anti-estrogen therapy. Denies other new/worsening bone pain. Hot flashes between 6 - 7 pm are mild. Denies breast concerns/changes.   ROS  All other systems reviewed and negative   Past Medical History:  Diagnosis Date   ADHD    Alcohol abuse    Anemia    Anxiety    Anxiety and depression    Arthritis    left knee   Asthma    hardly uses inhaler   B12 deficiency    Cancer (HCC)    Constipation    Depression    Diverticulosis    Fatty liver 2012   pt states she was told this once   Headache    Migraines   Heart murmur    pt was told this, no echo   History of stomach ulcers    Hypertension    history no longer requires medication management   IBS (irritable bowel syndrome)    IDA (iron  deficiency anemia)    Infertility, female    Joint pain    Lactose intolerance    Melasma    Palpitations    Perforated ulcer (HCC)    Pre-diabetes    PUD (peptic ulcer disease)     Sleep apnea    mild, no CPAP ordered   SOBOE (shortness of breath on exertion)    Vitamin D  deficiency      Past Surgical History:  Procedure Laterality Date   ABDOMINAL HYSTERECTOMY     ANKLE RECONSTRUCTION Left 09/09/2018   Procedure: BROSTRUM-GOULD LEFT;  Surgeon: Ashley Soulier, DPM;  Location: [facility];  Service: Podiatry;  Laterality: Left;   BREAST LUMPECTOMY WITH RADIOACTIVE SEED AND SENTINEL LYMPH NODE BIOPSY Left 02/25/2021   Procedure: LEFT BREAST LUMPECTOMY WITH RADIOACTIVE SEED AND SENTINEL LYMPH NODE BIOPSY;  Surgeon: Vanderbilt Ned, MD;  Location: [facility];  Service: General;  Laterality: Left;   CHOLECYSTECTOMY     COLONOSCOPY     CYSTOSCOPY N/A 08/05/2016   Procedure: CYSTOSCOPY;  Surgeon: Sarrah Browning, MD;  Location: [facility];  Service: Gynecology;  Laterality: N/A;   ENDOMETRIAL ABLATION W/ NOVASURE     EXCISION, MASS, UPPER EXTREMITY Right 12/02/2023   Procedure: EXCISION, MASS, RIGHT AXILLARY;  Surgeon: Vanderbilt Ned, MD;  Location: [facility];  Service: General;  Laterality: Right;   GASTRIC BYPASS OPEN  LYSIS OF ADHESION N/A 08/05/2016   Procedure: LYSIS OF ADHESION;  Surgeon: Sarrah Browning, MD;  Location: [facility];  Service: Gynecology;  Laterality: N/A;   perforated ulcer surgery     ROBOTIC ASSISTED SALPINGO OOPHERECTOMY Right 05/12/2017   Procedure: XI ROBOTIC ASSISTED RIGHT OOPHORECTOMY, Fulgeration of Endometriosis, Peritoneal Biopsy;  Surgeon: Sarrah Browning, MD;  Location: [facility];  Service: Gynecology;  Laterality: Right;   ROBOTIC ASSISTED TOTAL HYSTERECTOMY WITH SALPINGECTOMY Bilateral 08/05/2016   Procedure: ROBOTIC ASSISTED TOTAL HYSTERECTOMY WITH SALPINGECTOMY;  Surgeon: Sarrah Browning, MD;  Location: [facility];  Service: Gynecology;  Laterality: Bilateral;   TENDON REPAIR Left 09/09/2018   Procedure: TENOLYSIS, MULTIPLE;  Surgeon: Ashley Soulier, DPM;  Location: [facility];  Service: Podiatry;  Laterality: Left;   TONSILLECTOMY      UPPER GI ENDOSCOPY       Outpatient Encounter Medications as of 03/30/2024  Medication Sig   AJOVY 225 MG/1.5ML SOAJ ADMINISTER 1.5 ML UNDER THE SKIN MONTHLY   albuterol  (PROVENTIL ) (2.5 MG/3ML) 0.083% nebulizer solution Take 2.5 mg by nebulization every 6 (six) hours as needed for wheezing or shortness of breath.   albuterol  (VENTOLIN  HFA) 108 (90 Base) MCG/ACT inhaler Inhale 2 puffs into the lungs every 6 (six) hours as needed for wheezing or shortness of breath.   ALPRAZolam  (XANAX ) 0.5 MG tablet Take 0.5 tablets by mouth 2 (two) times daily as needed for anxiety.   anastrozole  (ARIMIDEX ) 1 MG tablet Take 1 tablet (1 mg total) by mouth daily.   azelastine (ASTELIN) 0.1 % nasal spray Place 1 spray into both nostrils daily as needed for allergies or rhinitis.   cetirizine (ZYRTEC) 10 MG tablet Take 10 mg by mouth daily as needed (allergies).   Cyanocobalamin  1000 MCG/ML KIT Inject 1,000 mcg as directed every 30 (thirty) days.   desvenlafaxine (PRISTIQ) 50 MG 24 hr tablet Take 50 mg by mouth daily.   gabapentin  (NEURONTIN ) 100 MG capsule Take 1 capsule (100 mg total) by mouth at bedtime. OK to increase dose to 3 capsules at night in 2-3 weeks if needed for hot flushes   hydrochlorothiazide (MICROZIDE) 12.5 MG capsule Take 12.5 mg by mouth daily.   lamoTRIgine  (LAMICTAL ) 100 MG tablet Take 200 mg by mouth 2 (two) times daily.   Lisdexamfetamine Dimesylate (VYVANSE PO) Take by mouth.   methocarbamol (ROBAXIN) 500 MG tablet Take 500 mg by mouth.   oxyCODONE  (OXY IR/ROXICODONE ) 5 MG immediate release tablet Take 5 mg by mouth.   oxyCODONE  (OXY IR/ROXICODONE ) 5 MG immediate release tablet Take 1 tablet (5 mg total) by mouth every 6 (six) hours as needed for severe pain (pain score 7-10).   Tirzepatide -Weight Management (ZEPBOUND  [place]) Inject into the skin.   topiramate  (TOPAMAX ) 50 MG tablet Take 1 tablet (50 mg total) by mouth 3 (three) times daily.   valACYclovir (VALTREX) 1000 MG tablet Take  1,000 mg by mouth daily as needed (cold sores).    Vitamin D , Ergocalciferol , (DRISDOL ) 1.25 MG (50000 UNIT) CAPS capsule Take 1 capsule (50,000 Units total) by mouth every 7 (seven) days.   zolpidem  (AMBIEN  CR) 12.5 MG CR tablet Take 1 tablet (12.5 mg total) by mouth at bedtime.   No facility-administered encounter medications on file as of 03/30/2024.     Today's Vitals   03/30/24 1204 03/30/24 1209  BP: (!) 120/58   Pulse: 65   Resp: 17   Temp: 97.8 F (36.6 C)   SpO2: 96%   Weight: 278 lb 14.4 oz (126.5 kg)  PainSc:  0-No pain   Body mass index is 46.41 kg/m.   ECOG PERFORMANCE STATUS: 0 - Asymptomatic  PHYSICAL EXAM GENERAL:alert, no distress and comfortable SKIN: no rash  EYES: sclera clear NECK: without mass LYMPH:  no palpable cervical or supraclavicular lymphadenopathy  LUNGS: clear with normal breathing effort HEART: regular rate & rhythm, no lower extremity edema ABDOMEN: abdomen soft, non-tender and normal bowel sounds. No RUQ ttp NEURO: alert & oriented x 3 with fluent speech, no focal motor/sensory deficits Breast exam: no nipple discharge or inversion. S/p left lumpectomy, incision healed with scar tissue at the left lateral edge. No palpable mass or nodularity in either breast or axilla that I could appreciate   CBC    Latest Ref Rng & Units 03/30/2024   11:03 AM 09/24/2023   10:29 AM 06/24/2023   10:52 AM  CBC  WBC 4.0 - 10.5 K/uL 8.0  5.8  7.5   Hemoglobin 12.0 - 15.0 g/dL 86.6  86.5  85.7   Hematocrit 36.0 - 46.0 % 42.1  41.5  43.0   Platelets 150 - 400 K/uL 313  275  300.0       CMP     Latest Ref Rng & Units 03/30/2024   11:03 AM 11/30/2023    1:00 PM 09/24/2023   10:29 AM  CMP  Glucose 70 - 99 mg/dL 899  86  87   BUN 6 - 20 mg/dL 16  10  13    Creatinine 0.44 - 1.00 mg/dL 9.21  9.20  9.31   Sodium 135 - 145 mmol/L 143  138  142   Potassium 3.5 - 5.1 mmol/L 3.7  3.9  3.5   Chloride 98 - 111 mmol/L 102  96  103   CO2 22 - 32 mmol/L 28  29   33   Calcium 8.9 - 10.3 mg/dL 9.4  9.3  9.2   Total Protein 6.5 - 8.1 g/dL 7.2   7.0   Total Bilirubin 0.0 - 1.2 mg/dL 0.3   0.2   Alkaline Phos 38 - 126 U/L 437   154   AST 15 - 41 U/L 352   25   ALT 0 - 44 U/L 179   38       ASSESSMENT & PLAN: 59 year old female    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  Oncotype RS 4 -Found on screening mammogram. S/p left lumpectomy 02/25/21 with Dr. Vanderbilt, pathology showed 0.9 cm IDC, benign margins and lymph nodes (0/3). -Oncotype RS of 4, indicating low risk.  -s/p adjuvant radiation under Dr. Izell, 04/03/21 - 05/05/21 -she is s/p hysterectomy 07/2016, ovaries are still in place. Labs from 05/02/21 show she was pre-menopausal, so she began tamoxifen  in 05/2021, d/c'd due to transaminitis -She did not tolerate exemestane  or letrozole, currently on anastrozole   -Ms. Ellender is clinically doing well, tolerating anastrozole  exam is benign, recent Mammo 02/08/24 was benign.  No clinical concern for recurrence.  Continue breast cancer surveillance  Transaminitis - Chronic and fluctuating, initially felt to be 2/2 Tamoxifen  -US  and MRI 04/2023 showed fatty liver - Today she appears to have a marked increase in AST/ALT and alk phos compared to outside PCP labs a week ago - She does not drink alcohol or take statins.  She has stopped Tylenol . Other meds reviewed likely not contributing  Other iron  deficiency anemia -h/o gastric bypass, which is  likely the cause of her anemia  -she previously received IV intermittently starting in 2016, last venofer  11/2021  - Iron  studies normal on oral iron , continue   Vitamin B12 deficiency -Secondary to gastric bypass surgery -B12 level in March 2024 was normal -She is on B12 injection monthly at her PCP office. - B12 level normal 03/21/2024 at 576    PLAN: -Continue breast cancer surveillance -Recent  mammogram reviewed, due 01/2025 -Hold anastrozole , repeat LFTs 1/21 at PCP, I will follow, imaging pending lab results -F/up in 6 months, or sooner if needed     All questions were answered. The patient knows to call the clinic with any problems, questions or concerns. No barriers to learning were detected. I spent 20 minutes counseling the patient face to face. The total time spent in the appointment was 30 minutes and more than 50% was on counseling, review of test results, and coordination of care.   Linnie Delgrande K Amneet Cendejas, NP 03/30/2024   "

## 2024-03-27 ENCOUNTER — Other Ambulatory Visit

## 2024-03-27 ENCOUNTER — Ambulatory Visit: Admitting: Nurse Practitioner

## 2024-03-28 ENCOUNTER — Ambulatory Visit

## 2024-03-29 ENCOUNTER — Inpatient Hospital Stay: Payer: Self-pay

## 2024-03-30 ENCOUNTER — Inpatient Hospital Stay: Attending: Adult Health

## 2024-03-30 ENCOUNTER — Encounter: Payer: Self-pay | Admitting: Nurse Practitioner

## 2024-03-30 ENCOUNTER — Inpatient Hospital Stay: Payer: Self-pay | Attending: Adult Health | Admitting: Nurse Practitioner

## 2024-03-30 VITALS — BP 120/58 | HR 65 | Temp 97.8°F | Resp 17 | Wt 278.9 lb

## 2024-03-30 DIAGNOSIS — Z79811 Long term (current) use of aromatase inhibitors: Secondary | ICD-10-CM | POA: Insufficient documentation

## 2024-03-30 DIAGNOSIS — Z170 Estrogen receptor positive status [ER+]: Secondary | ICD-10-CM | POA: Diagnosis not present

## 2024-03-30 DIAGNOSIS — D508 Other iron deficiency anemias: Secondary | ICD-10-CM | POA: Insufficient documentation

## 2024-03-30 DIAGNOSIS — R7989 Other specified abnormal findings of blood chemistry: Secondary | ICD-10-CM | POA: Diagnosis not present

## 2024-03-30 DIAGNOSIS — E538 Deficiency of other specified B group vitamins: Secondary | ICD-10-CM

## 2024-03-30 DIAGNOSIS — Z1732 Human epidermal growth factor receptor 2 negative status: Secondary | ICD-10-CM | POA: Insufficient documentation

## 2024-03-30 DIAGNOSIS — Z1721 Progesterone receptor positive status: Secondary | ICD-10-CM | POA: Diagnosis not present

## 2024-03-30 DIAGNOSIS — E2839 Other primary ovarian failure: Secondary | ICD-10-CM

## 2024-03-30 DIAGNOSIS — C50212 Malignant neoplasm of upper-inner quadrant of left female breast: Secondary | ICD-10-CM | POA: Diagnosis not present

## 2024-03-30 DIAGNOSIS — Z923 Personal history of irradiation: Secondary | ICD-10-CM | POA: Diagnosis not present

## 2024-03-30 LAB — IRON AND IRON BINDING CAPACITY (CC-WL,HP ONLY)
Iron: 53 ug/dL (ref 28–170)
Saturation Ratios: 13 % (ref 10.4–31.8)
TIBC: 402 ug/dL (ref 250–450)
UIBC: 349 ug/dL

## 2024-03-30 LAB — CMP (CANCER CENTER ONLY)
ALT: 179 U/L — ABNORMAL HIGH (ref 0–44)
AST: 352 U/L (ref 15–41)
Albumin: 4.1 g/dL (ref 3.5–5.0)
Alkaline Phosphatase: 437 U/L — ABNORMAL HIGH (ref 38–126)
Anion gap: 12 (ref 5–15)
BUN: 16 mg/dL (ref 6–20)
CO2: 28 mmol/L (ref 22–32)
Calcium: 9.4 mg/dL (ref 8.9–10.3)
Chloride: 102 mmol/L (ref 98–111)
Creatinine: 0.78 mg/dL (ref 0.44–1.00)
GFR, Estimated: >60 mL/min
Glucose, Bld: 100 mg/dL — ABNORMAL HIGH (ref 70–99)
Potassium: 3.7 mmol/L (ref 3.5–5.1)
Sodium: 143 mmol/L (ref 135–145)
Total Bilirubin: 0.3 mg/dL (ref 0.0–1.2)
Total Protein: 7.2 g/dL (ref 6.5–8.1)

## 2024-03-30 LAB — CBC WITH DIFFERENTIAL (CANCER CENTER ONLY)
Abs Immature Granulocytes: 0.02 K/uL (ref 0.00–0.07)
Basophils Absolute: 0 K/uL (ref 0.0–0.1)
Basophils Relative: 1 %
Eosinophils Absolute: 0.2 K/uL (ref 0.0–0.5)
Eosinophils Relative: 3 %
HCT: 42.1 % (ref 36.0–46.0)
Hemoglobin: 13.3 g/dL (ref 12.0–15.0)
Immature Granulocytes: 0 %
Lymphocytes Relative: 33 %
Lymphs Abs: 2.7 K/uL (ref 0.7–4.0)
MCH: 28 pg (ref 26.0–34.0)
MCHC: 31.6 g/dL (ref 30.0–36.0)
MCV: 88.6 fL (ref 80.0–100.0)
Monocytes Absolute: 0.4 K/uL (ref 0.1–1.0)
Monocytes Relative: 5 %
Neutro Abs: 4.7 K/uL (ref 1.7–7.7)
Neutrophils Relative %: 58 %
Platelet Count: 313 K/uL (ref 150–400)
RBC: 4.75 MIL/uL (ref 3.87–5.11)
RDW: 14.1 % (ref 11.5–15.5)
WBC Count: 8 K/uL (ref 4.0–10.5)
nRBC: 0 % (ref 0.0–0.2)

## 2024-03-30 LAB — VITAMIN B12: Vitamin B-12: 1326 pg/mL — ABNORMAL HIGH (ref 180–914)

## 2024-03-30 LAB — FERRITIN: Ferritin: 113 ng/mL (ref 11–307)

## 2024-03-30 NOTE — Progress Notes (Signed)
" °  The patient reports they are physically located in [place]  and is currently: at home. I conducted a phone visit.  I spent 4:40 minutes on the phone call with the patient on the date of service .   TELEPHONE VISIT:  The patient's headaches are better.  She does continue to have some intermittent neck pain.  I reviewed her cervical MRI and I discussed that I would not favor surgical invention for the axial neck pain.  The EMG/NCS was suggestive of mild carpal tunnel syndrome.  Bilaterally.  We discussed various conservative options starting with right splints to try to see if that would help her symptoms.  I discussed that she should give this good 6 weeks to 8 weeks to try it and try to use the splints is much as possible particularly at night but even during the day when possible.  Otherwise if her symptoms do not improve then to let me know and we could always discuss potential surgical intervention if she feels that symptoms warrant it.  Weyman JONETTA Lessen, MD, MBA, MS, REENA, FACS Minimally Invasive and Complex Spine Surgery Clinical Professor of Neurosurgery [facility]   "

## 2024-03-30 NOTE — Progress Notes (Signed)
 Critical lab reported: AST 342; ALT 179; Tbili 0.3  Notified Mayme Silversmith, NP

## 2024-04-04 ENCOUNTER — Ambulatory Visit

## 2024-04-05 ENCOUNTER — Encounter: Payer: Self-pay | Admitting: Nurse Practitioner

## 2024-04-06 ENCOUNTER — Ambulatory Visit

## 2024-04-11 ENCOUNTER — Ambulatory Visit

## 2024-04-12 NOTE — Progress Notes (Signed)
 " Chief Complaint  Patient presents with   rib pain    C/o pain in both ribs x 1 week, no Injury per patient     Subjective  Tracy [name] is a 59 y.o. female who presents for rib pain (C/o pain in both ribs x 1 week, no Injury per patient ) HPI History of Present Illness Tracy [name] is a 59 year old female who presents with bilateral  upper abdominal/lower rib pain.  She experiences bilateral abdominal pain that initially began on right side and then moved to the other. The pain is described as colicky and wave-like, reminiscent of her past gallbladder pain, although her gallbladder has been removed. She has a history of bladder infections, with the most recent occurring in December. She contacted her doctor suspecting another infection and was prescribed medication that elevated her liver enzymes. A different medication was later prescribed, which improved her symptoms, but she still feels some pain.  She is concerned about her elevated liver enzymes and mentions that she is taking medications including Lamictal . Her therapist is reducing her Lamictal  dosage, suspecting it may be contributing to the elevation. She has not taken Tylenol  in over two weeks. She has a history of peptic ulcer disease and is cautious with Advil  use.  She is a breast cancer survivor and has a history of seasonal affective disorder. She expresses significant anxiety about her health, fearing that she might die. Her daughter recently announced her second pregnancy, which has been an emotional event for her. Sees [facility]  She has a history of gastric bypass surgery and describes the current pain as reminiscent of her past gallbladder pain. She has been drinking a lot of water  to try to 'flush the toxins out of her body'.  Her current medications include oxycodone , which she takes in small doses, and Lamictal , which is being tapered. She has experienced difficulty with pharmacy refills and feels stigmatized  when obtaining her prescriptions. Her husband is concerned about her oxycodone  use and the challenges she faces with pain management.  Usually takes a quarter of a 5 mg oxycodone  and if not helping with pain well take another quarter.  Mr. Verlinda with orthopedics prescribes.  Also sees pain management at [facility].  Recalls a neck injection done by Dr. Avanell years ago that resulted in a month-long decrease in her neck pain.  Other injections have been less successful.  She has not experienced any food poisoning. She was concerned she might look jaundiced, but this was not confirmed by her husband who thought it was the lighting in the room. Headaches have improved.          Review of Systems  Patient Active Problem List  Diagnosis   PUD (peptic ulcer disease) - has seen Dr. Charlean   Asthma (HHS-HCC)   Anxiety and depression   Iron  deficiency anemia due to chronic blood loss: sees Dr. 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 (CMS/HHS-HCC)  Pinched vertebral nerve   ADD (attention deficit disorder) without hyperactivity   Atrophic vaginitis   Cervicalgia   Closed nondisplaced fracture of sixth cervical vertebra with routine healing   Insulin  resistance   Lactose intolerance   Menopausal syndrome   Other constipation   Other fatigue   Polyphagia   Right lower quadrant pain   S/P gastric bypass   Vitamin D  deficiency   SOBOE (shortness of breath on exertion)   Hx of peptic ulcer    Outpatient Medications  Prior to Visit  Medication Sig Dispense Refill   albuterol  MDI, PROVENTIL , VENTOLIN , PROAIR , HFA 90 mcg/actuation inhaler Inhale 2 inhalations into the lungs every 4 (four) hours as needed for Wheezing 6.7 g 2   ALPRAZolam  (XANAX ) 0.5 MG tablet TAKE 1 TO 2 TABLETS BY MOUTH DAILY AS NEEDED FOR SEVERE ANXIETY     anastrozole  (ARIMIDEX ) 1 mg tablet Take 1 mg by mouth once daily     budesonide-formoteroL (SYMBICORT) 80-4.5 mcg/actuation inhaler Inhale 2 inhalations into the lungs 2 (two) times daily And use every 4 hours as needed for rescue inhaler 6.9 g 1   celecoxib (CELEBREX) 100 MG capsule TAKE 1 CAPSULE(100 MG) BY MOUTH DAILY 30 capsule 0   cephalexin  (KEFLEX ) 500 MG capsule Take 1 capsule every 6 hours by oral route for 5 days, for bladder infection - drink lots of fluids.     cetirizine (ZYRTEC) 10 mg capsule Take 10 mg by mouth once daily Reported on 07/29/2015       cyanocobalamin  (VITAMIN B12) 1,000 mcg/mL injection      desvenlafaxine succinate (PRISTIQ) 100 MG ER tablet Take 100 mg by mouth once daily     fremanezumab-vfrm 225 mg/1.5 mL AtIn Inject 225 mg subcutaneously monthly 1.5 mL 11   gabapentin  (NEURONTIN ) 100 MG capsule 1 po qHS 60 capsule 0   hydroCHLOROthiazide (HYDRODIURIL) 12.5 MG tablet TAKE 1 TABLET(12.5 MG) BY MOUTH EVERY DAY 100 tablet 1   hydroquinone 4 % cream Apply topically at bedtime 28 g 0   lamoTRIgine  (LAMICTAL ) 100 MG tablet Take 100 mg by mouth every 12 (twelve) hours     lisdexamfetamine (VYVANSE) 50 MG capsule Take 50 mg by mouth once daily     lurasidone (LATUDA) 60 mg tablet Take 60 mg by mouth once daily     methocarbamoL (ROBAXIN) 500 MG tablet Take 1 tablet (500 mg total) by mouth 4 (four) times daily 40 tablet 2   oxyCODONE  (ROXICODONE ) 5 MG immediate release tablet Take 1 tablet (5 mg total) by mouth every 4 (four) hours as needed for Pain for up to 40 doses 40 tablet 0   potassium chloride (KLOR-CON) 10 MEQ ER tablet Take 1 tablet (10  mEq total) by mouth once daily 30 tablet 3   PREVIDENT 5000 SENSITIVE 1.1-5 % use as directed by prescriber  0   topiramate  (TOPAMAX ) 50 MG tablet TAKE 1 TABLET(50 MG) BY MOUTH THREE TIMES DAILY 300 tablet 3   tretinoin (RETIN-A) 0.05 % cream Apply topically at bedtime 45 g 1   valACYclovir (VALTREX) 1000 MG tablet TAKE 2 TABLETS(2000 MG) BY MOUTH TWICE DAILY 60 tablet 11   zolpidem  (AMBIEN  CR) 12.5 MG CR tablet Take 12.5 mg by mouth at bedtime     doxycycline (MONODOX) 100 MG capsule Take 1 capsule (100 mg total) by mouth 2 (two) times daily for 5 days (Patient not taking: Reported on 02/15/2024) 10 capsule 0   traMADoL (ULTRAM) 50 mg tablet Take 1 tablet (  50 mg total) by mouth every 6 (six) hours as needed for Pain for up to 30 doses (Patient not taking: Reported on 04/12/2024) 30 tablet 0   Facility-Administered Medications Prior to Visit  Medication Dose Route Frequency Provider Last Rate Last Admin   cyanocobalamin  (VITAMIN B12) injection 1,000 mcg  1,000 mcg Intramuscular Q30 Days Jose-Mathews, Jessnie, MD   1,000 mcg at 12/31/23 1135      Objective  Vitals:   04/12/24 1119 04/12/24 1121  BP:  124/82  Pulse:  81  Weight: (!) 121.3 kg (267 lb 6.7 oz)   PainSc:   2   2  PainLoc: Rib Cage Rib Cage   Body mass index is 44.5 kg/m.  Home Vitals:     Physical Exam Physical Exam SKIN: No jaundice  Constitutional: alert, in NAD, and communicates well Eye exam: pupils equal and reactive, extraocular eye movements intact. Neck: supple and no thyroid  enlargement or cervical adenopathy Respiratory: clear to auscultation, without rales or wheezes  Cardiovascular: regular rate and rhythm and without murmurs, rubs or gallops Abdomen: Minimal tenderness in bilateral upper abdomen/lower ribs with deep palpation Lower extremities: no lower extremity edema Skin ankles/feet: warm, good capillary refill and no ulcerations or lesions noted Results  FIB-4 Calculation: 0.97 at  04/05/2024 11:50 AM Calculated from: SGOT/AST: 117 U/L at 04/05/2024 11:50 AM SGPT/ALT: 317 U/L at 04/05/2024 11:50 AM Platelets: 380 x109/L at 03/21/2024 11:24 AM Age: 22 years      Assessment/Plan:   Assessment & Plan Evaluation of elevated liver enzymes Elevated liver enzymes potentially due to medication use, including Lamictal , oxycodone , and Tylenol . No jaundice or acute hepatitis symptoms. Previous ultrasound suggested possible fatty liver disease. Differential includes medication-induced liver enzyme elevation and fatty liver disease. Long-term (years) elevated liver enzymes can lead to liver scarring and fibrosis, potentially progressing to cirrhosis.  Low risk of fibrosis as above based on current calculations - Rechecked liver enzymes. GGT, CBC, acute hepatitis panel, and LDH. - Avoid Tylenol  and ibuprofen  due to potential liver enzyme elevation. - Will consider imaging if liver enzymes remain elevated.  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 Pain improved with current regimen. Concerns about long-term opioid use and potential for dependency. Previous pain management attempts included injections and psychological support, with variable success. Pain management specialists have shifted focus to non-narcotic modalities. - Continue oxycodone  at current low dose as needed for pain. - Consider psychological support for pain management. - Maintain a small stockpile of oxycodone  for emergencies.  History of malignant neoplasm of left breast Breast cancer survivor with ongoing oncological follow-up. Recent oncologist visit on January 15th, 2025, with plans to recheck liver enzymes and other blood work before further imaging. Oncologist is coordinating care to avoid redundant testing. - Coordinated with oncologist for liver enzyme recheck and further management.  History of peptic ulcer disease Peptic  ulcer disease with caution advised against NSAID use due to risk of gastrointestinal bleeding and her previous gastric bypass. Current pain management includes oxycodone , which is preferred over NSAIDs. - Avoid NSAIDs like ibuprofen  due to previous gastric bypass and peptic ulcer disease. - Use oxycodone  for pain management.  Status post gastric bypass Gastric bypass surgery with dietary modifications and medication adjustments. Avoidance of NSAIDs recommended due to increased risk of gastrointestinal complications. - Avoid NSAIDs due to increased risk of gastrointestinal complications post-gastric bypass.  Occasional use likely warrented.    Diagnoses and all orders  for this visit:  Essential hypertension  Class 3 severe obesity due to excess calories with serious comorbidity and body mass index (BMI) of 40.0 to 44.9 in adult (CMS-HCC)  Elevated liver enzymes -     Gamma Glutamyl Transpeptidase (GGT); Future -     Lactate Dehydrogenase (LDH); Future -     Complete Blood Count (CBC) with Differential; Future -     Hepatitis Panel, Acute; Future -     Comprehensive Metabolic Panel (CMP); Future  Hx of peptic ulcer  Urine frequency -     DPC POC Urinalysis Chemical w/Option to Reflex Urine Culture; Future  Carcinoma of upper-inner quadrant of left breast in female, estrogen receptor negative (CMS/HHS-HCC)     This visit was coded based on medical decision making (MDM).           Future Appointments     Date/Time Provider Department Center Visit Type   05/29/2024 10:00 AM (Arrive by 9:45 AM) Johnie Damien Dragon, PA [facility] [place] [facility] PHYSICAL   06/23/2024 10:45 AM Evern Allyson Hacker, NP [facility] RETURN VISIT       There are no Patient Instructions on file for this visit.  An after visit summary was provided for the patient either in written format (printed) or through [facility].  This note has been created using automated  tools and reviewed for accuracy by KAREN JUTTA BEHLING.  "

## 2024-04-13 ENCOUNTER — Encounter: Payer: Self-pay | Admitting: Nurse Practitioner

## 2024-04-13 ENCOUNTER — Ambulatory Visit

## 2024-04-14 ENCOUNTER — Ambulatory Visit
Admission: RE | Admit: 2024-04-14 | Discharge: 2024-04-14 | Disposition: A | Source: Ambulatory Visit | Attending: Pediatrics | Admitting: Pediatrics

## 2024-04-14 ENCOUNTER — Other Ambulatory Visit: Payer: Self-pay | Admitting: Pediatrics

## 2024-04-14 DIAGNOSIS — Z171 Estrogen receptor negative status [ER-]: Secondary | ICD-10-CM | POA: Insufficient documentation

## 2024-04-14 DIAGNOSIS — C50212 Malignant neoplasm of upper-inner quadrant of left female breast: Secondary | ICD-10-CM | POA: Insufficient documentation

## 2024-04-14 DIAGNOSIS — R748 Abnormal levels of other serum enzymes: Secondary | ICD-10-CM

## 2024-04-14 MED ORDER — IOHEXOL 9 MG/ML PO SOLN
500.0000 mL | ORAL | Status: AC
  Administered 2024-04-14 (×2): 500 mL via ORAL

## 2024-04-14 MED ORDER — IOHEXOL 300 MG/ML  SOLN
100.0000 mL | Freq: Once | INTRAMUSCULAR | Status: AC | PRN
  Administered 2024-04-14: 100 mL via INTRAVENOUS

## 2024-04-17 ENCOUNTER — Encounter: Payer: Self-pay | Admitting: Nurse Practitioner

## 2024-04-20 ENCOUNTER — Telehealth: Payer: Self-pay | Admitting: Nurse Practitioner

## 2024-04-20 NOTE — Telephone Encounter (Signed)
 I called Ms. Tracy [name] to f/up CT and LFTs. PCP suggested liver specialist which is reasonable, but pt saw Tracy Blower, PA at [facility] in the past and would like to see her first. I will send a message and pt will also f/up with a phone call to schedule.   We discussed restarting Anastrozole  since I don't think this is contributing to elevated LFTs. She agrees to restart.   We reviewed surveillance tool with MRD testing via [employer] or [employer]. She will think about it and let me know if she prefers to pursue.   Otherwise we will see her at next scheduled visit in July. She knows we are available sooner if needed and had no other concerns at this time.   Tracy Aguado, NP

## 2024-09-28 ENCOUNTER — Inpatient Hospital Stay

## 2024-09-28 ENCOUNTER — Inpatient Hospital Stay: Admitting: Nurse Practitioner
